# Patient Record
Sex: Male | Born: 1980 | Race: White | Hispanic: No | Marital: Single | Smoking: Current every day smoker
Health system: Southern US, Community
[De-identification: ages and names within clinical notes are randomized; demographics above are authoritative.]

## PROBLEM LIST (undated history)

## (undated) DIAGNOSIS — F329 Major depressive disorder, single episode, unspecified: Secondary | ICD-10-CM

## (undated) DIAGNOSIS — M129 Arthropathy, unspecified: Secondary | ICD-10-CM

## (undated) DIAGNOSIS — F32A Depression, unspecified: Secondary | ICD-10-CM

## (undated) DIAGNOSIS — F419 Anxiety disorder, unspecified: Secondary | ICD-10-CM

## (undated) DIAGNOSIS — Z8614 Personal history of Methicillin resistant Staphylococcus aureus infection: Secondary | ICD-10-CM

## (undated) DIAGNOSIS — K759 Inflammatory liver disease, unspecified: Secondary | ICD-10-CM

## (undated) DIAGNOSIS — L03115 Cellulitis of right lower limb: Secondary | ICD-10-CM

## (undated) DIAGNOSIS — M1990 Unspecified osteoarthritis, unspecified site: Secondary | ICD-10-CM

## (undated) DIAGNOSIS — L0390 Cellulitis, unspecified: Secondary | ICD-10-CM

## (undated) HISTORY — DX: Unspecified osteoarthritis, unspecified site: M19.90

## (undated) HISTORY — DX: Cellulitis of right lower limb: L03.115

## (undated) HISTORY — DX: Depression, unspecified: F32.A

## (undated) HISTORY — DX: Anxiety disorder, unspecified: F41.9

## (undated) HISTORY — DX: Major depressive disorder, single episode, unspecified: F32.9

## (undated) HISTORY — PX: GALLBLADDER SURGERY: SHX652

## (undated) HISTORY — PX: HX GALL BLADDER SURGERY/CHOLE: SHX55

---
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
## 2014-09-12 ENCOUNTER — Ambulatory Visit (INDEPENDENT_AMBULATORY_CARE_PROVIDER_SITE_OTHER): Payer: Self-pay | Admitting: Emergency Medicine

## 2014-09-12 VITALS — BP 100/70 | HR 67 | Temp 98.4°F | Resp 16 | Ht 65.5 in | Wt 209.0 lb

## 2014-09-12 DIAGNOSIS — F1020 Alcohol dependence, uncomplicated: Secondary | ICD-10-CM | POA: Insufficient documentation

## 2014-09-12 DIAGNOSIS — F1910 Other psychoactive substance abuse, uncomplicated: Secondary | ICD-10-CM

## 2014-09-12 NOTE — Progress Notes (Signed)
   Subjective:  Patient ID: Robert [name], male    DOB: 08/06/1980  Age: 34 y.o. MRN: [id number]  CC: Annual Exam   HPI Robert [name] presents  with a history of his fourth DUI. He's been suspended for quite a while he began drinking at the age of 34. He will abused both alcohol and marijuana. He claims sobriety since beginning in [place] and October 1 of 2015. He is currently residing in a [facility]. Continuing is AA involvement.  History  Social, family, and past medical history are reviewed. Except for his history of alcohol abuse multiple DUIs and marijuana use are negative. He's been in remission for 9 months now.  Review of Systems  Objective:  BP 100/70 mmHg  Pulse 67  Temp(Src) 98.4 F (36.9 C) (Oral)  Resp 16  Ht 5' 5.5" (1.664 m)  Wt 209 lb (94.802 kg)  BMI 34.24 kg/m2  SpO2 98%  Physical Exam  Constitutional: He is oriented to person, place, and time. He appears well-developed and well-nourished. No distress.  HENT:  Head: Normocephalic and atraumatic.  Right Ear: External ear normal.  Left Ear: External ear normal.  Nose: Nose normal.  Eyes: Conjunctivae and EOM are normal. Pupils are equal, round, and reactive to light. No scleral icterus.  Neck: Normal range of motion. Neck supple. No tracheal deviation present.  Cardiovascular: Normal rate, regular rhythm and normal heart sounds.   Pulmonary/Chest: Effort normal. No respiratory distress. He has no wheezes. He has no rales.  Abdominal: He exhibits no mass. There is no tenderness. There is no rebound and no guarding.  Musculoskeletal: He exhibits no edema.  Lymphadenopathy:    He has no cervical adenopathy.  Neurological: He is alert and oriented to person, place, and time. Coordination normal.  Skin: Skin is warm and dry. No rash noted.  Psychiatric: He has a normal mood and affect. His behavior is normal.      Assessment & Plan:   Robert [name] was seen today for annual exam.  Diagnoses and all  orders for this visit:  Alcoholism  Substance abuse   I am having Robert [name] maintain his sertraline.  Meds ordered this encounter  Medications  . sertraline (ZOLOFT) 100 MG tablet    Sig: Take 100 mg by mouth daily.    Appropriate red flag conditions were discussed with the patient as well as actions that should be taken.  Patient expressed his understanding.  Follow-up: Return if symptoms worsen or fail to improve.  Carmelina DaneAnderson, Yurianna Tusing S, MD

## 2015-10-13 ENCOUNTER — Emergency Department (HOSPITAL_BASED_OUTPATIENT_CLINIC_OR_DEPARTMENT_OTHER)
Admission: EM | Admit: 2015-10-13 | Discharge: 2015-10-13 | Disposition: A | Discharge status: Other Acute Inpatient Hospital | Payer: Self-pay | Attending: Emergency Medicine | Admitting: Emergency Medicine

## 2015-10-13 ENCOUNTER — Inpatient Hospital Stay
Admission: RE | Admit: 2015-10-13 | Discharge: 2015-10-16 | DRG: 897 | Disposition: A | Discharge status: Home / Self Care | Payer: Self-pay | Source: Intra-hospital | Attending: PSYCHIATRY | Admitting: PSYCHIATRY

## 2015-10-13 ENCOUNTER — Encounter (HOSPITAL_BASED_OUTPATIENT_CLINIC_OR_DEPARTMENT_OTHER): Payer: Self-pay

## 2015-10-13 DIAGNOSIS — F329 Major depressive disorder, single episode, unspecified: Secondary | ICD-10-CM | POA: Diagnosis present

## 2015-10-13 DIAGNOSIS — Z560 Unemployment, unspecified: Secondary | ICD-10-CM

## 2015-10-13 DIAGNOSIS — F1220 Cannabis dependence, uncomplicated: Secondary | ICD-10-CM

## 2015-10-13 DIAGNOSIS — F418 Other specified anxiety disorders: Secondary | ICD-10-CM | POA: Insufficient documentation

## 2015-10-13 DIAGNOSIS — Z590 Homelessness: Secondary | ICD-10-CM

## 2015-10-13 DIAGNOSIS — F17210 Nicotine dependence, cigarettes, uncomplicated: Secondary | ICD-10-CM | POA: Insufficient documentation

## 2015-10-13 DIAGNOSIS — Y903 Blood alcohol level of 60-79 mg/100 ml: Secondary | ICD-10-CM | POA: Insufficient documentation

## 2015-10-13 DIAGNOSIS — F1020 Alcohol dependence, uncomplicated: Secondary | ICD-10-CM | POA: Diagnosis present

## 2015-10-13 DIAGNOSIS — M62838 Other muscle spasm: Secondary | ICD-10-CM

## 2015-10-13 DIAGNOSIS — F419 Anxiety disorder, unspecified: Secondary | ICD-10-CM

## 2015-10-13 DIAGNOSIS — F1110 Opioid abuse, uncomplicated: Secondary | ICD-10-CM | POA: Diagnosis present

## 2015-10-13 DIAGNOSIS — Z8614 Personal history of Methicillin resistant Staphylococcus aureus infection: Secondary | ICD-10-CM | POA: Insufficient documentation

## 2015-10-13 DIAGNOSIS — F1423 Cocaine dependence with withdrawal: Secondary | ICD-10-CM | POA: Diagnosis present

## 2015-10-13 DIAGNOSIS — F1994 Other psychoactive substance use, unspecified with psychoactive substance-induced mood disorder: Secondary | ICD-10-CM

## 2015-10-13 DIAGNOSIS — F1924 Other psychoactive substance dependence with psychoactive substance-induced mood disorder: Secondary | ICD-10-CM | POA: Diagnosis present

## 2015-10-13 DIAGNOSIS — F1010 Alcohol abuse, uncomplicated: Secondary | ICD-10-CM | POA: Insufficient documentation

## 2015-10-13 DIAGNOSIS — F1520 Other stimulant dependence, uncomplicated: Secondary | ICD-10-CM | POA: Diagnosis present

## 2015-10-13 DIAGNOSIS — R45851 Suicidal ideations: Secondary | ICD-10-CM

## 2015-10-13 DIAGNOSIS — F1420 Cocaine dependence, uncomplicated: Secondary | ICD-10-CM | POA: Diagnosis present

## 2015-10-13 DIAGNOSIS — F1493 Cocaine use, unspecified with withdrawal (CMS HCC): Secondary | ICD-10-CM | POA: Diagnosis present

## 2015-10-13 DIAGNOSIS — Z716 Tobacco abuse counseling: Secondary | ICD-10-CM | POA: Insufficient documentation

## 2015-10-13 DIAGNOSIS — R030 Elevated blood-pressure reading, without diagnosis of hypertension: Secondary | ICD-10-CM | POA: Insufficient documentation

## 2015-10-13 DIAGNOSIS — F1910 Other psychoactive substance abuse, uncomplicated: Secondary | ICD-10-CM | POA: Insufficient documentation

## 2015-10-13 DIAGNOSIS — F19239 Other psychoactive substance dependence with withdrawal, unspecified: Secondary | ICD-10-CM | POA: Diagnosis present

## 2015-10-13 DIAGNOSIS — F3289 Other specified depressive episodes: Secondary | ICD-10-CM | POA: Diagnosis present

## 2015-10-13 HISTORY — DX: Personal history of Methicillin resistant Staphylococcus aureus infection: Z86.14

## 2015-10-13 HISTORY — DX: Depression, unspecified: F32.A

## 2015-10-13 HISTORY — DX: Anxiety disorder, unspecified: F41.9

## 2015-10-13 HISTORY — DX: Arthropathy, unspecified: M12.9

## 2015-10-13 LAB — COMPREHENSIVE METABOLIC PROFILE - BMC/JMC ONLY
ALBUMIN/GLOBULIN RATIO: 1.9 (ref 0.8–2.0)
ALBUMIN: 4.7 g/dL (ref 3.5–5.0)
ALKALINE PHOSPHATASE: 78 U/L (ref 38–126)
ALT (SGPT): 33 U/L (ref 17–63)
ANION GAP: 12 mmol/L — ABNORMAL HIGH (ref 3–11)
AST (SGOT): 29 U/L (ref 15–41)
BILIRUBIN TOTAL: 0.4 mg/dL (ref 0.3–1.2)
BUN/CREA RATIO: 7 (ref 6–22)
BUN: 6 mg/dL (ref 6–20)
CALCIUM: 9.6 mg/dL (ref 8.6–10.3)
CHLORIDE: 105 mmol/L (ref 101–111)
CO2 TOTAL: 23 mmol/L (ref 22–32)
CREATININE: 0.84 mg/dL (ref 0.61–1.24)
ESTIMATED GFR: >60 mL/min/1.73mˆ2 (ref >60)
GLUCOSE: 112 mg/dL — ABNORMAL HIGH (ref 70–110)
POTASSIUM: 3.8 mmol/L (ref 3.4–5.1)
PROTEIN TOTAL: 7.2 g/dL (ref 6.4–8.3)
SODIUM: 140 mmol/L (ref 136–145)

## 2015-10-13 LAB — DRUG SCREEN,URINE - BMC/JMC ONLY
AMPHETAMINES URINE: POSITIVE — AB
BARBITURATES URINE: NEGATIVE
BENZODIAZEPINES URINE: NEGATIVE
CANNABINOIDS URINE: POSITIVE — AB
COCAINE METABOLITES URINE: POSITIVE — AB
METHADONE URINE: NEGATIVE
OPIATES URINE: NEGATIVE
OXYCODONE URINE: NEGATIVE
PCP URINE: NEGATIVE
TRICYCLIC ANTIDEPRESSANTS URINE: NEGATIVE

## 2015-10-13 LAB — CBC WITH DIFF
BASOPHIL #: 0.1 x10ˆ3/uL (ref 0.00–0.10)
BASOPHIL %: 1 % (ref 0–3)
EOSINOPHIL #: 0.1 x10ˆ3/uL (ref 0.00–0.50)
EOSINOPHIL %: 1 % (ref 0–5)
HCT: 48.3 % (ref 40.0–50.0)
HGB: 16.4 g/dL (ref 13.5–18.0)
LYMPHOCYTE #: 2.5 x10ˆ3/uL (ref 1.00–4.80)
LYMPHOCYTE %: 26 % (ref 15–43)
MCH: 30.1 pg (ref 27.5–33.2)
MCHC: 34 g/dL (ref 32.0–36.0)
MCV: 88.5 fL (ref 82.0–97.0)
MONOCYTE #: 0.7 x10ˆ3/uL (ref 0.20–0.90)
MONOCYTE %: 7 % (ref 5–12)
MPV: 9.3 fL (ref 7.4–10.5)
MPV: 9.3 fL (ref 7.4–10.5)
NEUTROPHIL #: 6.1 x10ˆ3/uL (ref 1.50–6.50)
NEUTROPHIL %: 65 % (ref 43–76)
PLATELETS: 234 x10ˆ3/uL (ref 150–450)
RBC: 5.45 x10ˆ6/uL (ref 4.40–5.80)
RDW: 13.8 % (ref 11.0–16.0)
WBC: 9.4 x10ˆ3/uL (ref 4.0–11.0)

## 2015-10-13 LAB — ETHANOL, SERUM/PLASMA: ETHANOL: 78 mg/dL — ABNORMAL HIGH (ref <10)

## 2015-10-13 LAB — ACETAMINOPHEN LEVEL: ACETAMINOPHEN LEVEL: <10 ug/mL — ABNORMAL LOW (ref 10–30)

## 2015-10-13 LAB — URINALYSIS WITH MICROSCOPIC REFLEX IF INDICATED BMC/JMC ONLY
BILIRUBIN: NEGATIVE mg/dL
BLOOD: NEGATIVE mg/dL
BLOOD: NEGATIVE mg/dL
GLUCOSE: NEGATIVE mg/dL
KETONES: NEGATIVE mg/dL
LEUKOCYTES: NEGATIVE WBCs/uL
PH: 5 (ref <8.0)
PROTEIN: NEGATIVE mg/dL
SPECIFIC GRAVITY: 1.015 (ref <1.022)

## 2015-10-13 LAB — SALICYLATE ACID LEVEL: SALICYLATE LEVEL: <4 mg/dL — ABNORMAL LOW (ref 4–30)

## 2015-10-13 LAB — THYROID STIMULATING HORMONE WITH FREE T4 REFLEX: TSH: 1.509 u[IU]/mL (ref 0.340–5.330)

## 2015-10-13 MED ORDER — MAGNESIUM HYDROXIDE 400 MG/5 ML ORAL SUSPENSION
30.0000 mL | Freq: Every evening | ORAL | Status: DC | PRN
Start: 2015-10-13 — End: 2015-10-16

## 2015-10-13 MED ORDER — NICOTINE (POLACRILEX) 2 MG GUM
2.0000 mg | CHEWING_GUM | BUCCAL | Status: DC | PRN
Start: 2015-10-13 — End: 2015-10-16
  Administered 2015-10-13 – 2015-10-16 (×12): 2 mg via ORAL
  Filled 2015-10-13 (×13): qty 1

## 2015-10-13 MED ORDER — LOPERAMIDE 2 MG CAPSULE
2.0000 mg | ORAL_CAPSULE | ORAL | Status: DC | PRN
Start: 2015-10-13 — End: 2015-10-16

## 2015-10-13 MED ORDER — DICYCLOMINE 10 MG CAPSULE
20.0000 mg | ORAL_CAPSULE | ORAL | Status: DC | PRN
Start: 2015-10-13 — End: 2015-10-16
  Administered 2015-10-16: 20 mg via ORAL
  Filled 2015-10-13: qty 2

## 2015-10-13 MED ORDER — DIPHENHYDRAMINE 50 MG/ML INJECTION SOLUTION
50.0000 mg | Freq: Four times a day (QID) | INTRAMUSCULAR | Status: DC | PRN
Start: 2015-10-13 — End: 2015-10-16

## 2015-10-13 MED ORDER — MIRTAZAPINE 15 MG TABLET
7.5000 mg | ORAL_TABLET | Freq: Every evening | ORAL | Status: DC | PRN
Start: 2015-10-13 — End: 2015-10-16
  Administered 2015-10-14 – 2015-10-15 (×2): 7.5 mg via ORAL
  Filled 2015-10-13 (×2): qty 1

## 2015-10-13 MED ORDER — CLONIDINE HCL 0.1 MG TABLET
0.1000 mg | ORAL_TABLET | ORAL | Status: DC | PRN
Start: 2015-10-13 — End: 2015-10-16
  Administered 2015-10-14 – 2015-10-16 (×2): 0.1 mg via ORAL
  Filled 2015-10-13 (×3): qty 1

## 2015-10-13 MED ORDER — GABAPENTIN 300 MG CAPSULE
300.0000 mg | ORAL_CAPSULE | Freq: Three times a day (TID) | ORAL | Status: DC
Start: 2015-10-13 — End: 2015-10-16
  Administered 2015-10-13 – 2015-10-16 (×10): 300 mg via ORAL
  Filled 2015-10-13 (×11): qty 1

## 2015-10-13 MED ORDER — DIPHENHYDRAMINE 50 MG CAPSULE
50.0000 mg | ORAL_CAPSULE | Freq: Four times a day (QID) | ORAL | Status: DC | PRN
Start: 2015-10-13 — End: 2015-10-16

## 2015-10-13 MED ORDER — HALOPERIDOL 5 MG TABLET
5.0000 mg | ORAL_TABLET | Freq: Four times a day (QID) | ORAL | Status: DC | PRN
Start: 2015-10-13 — End: 2015-10-16

## 2015-10-13 MED ORDER — LORAZEPAM 2 MG/ML INJECTION SOLUTION
2.0000 mg | Freq: Four times a day (QID) | INTRAMUSCULAR | Status: DC | PRN
Start: 2015-10-13 — End: 2015-10-14

## 2015-10-13 MED ORDER — HALOPERIDOL LACTATE 5 MG/ML INJECTION SOLUTION
5.0000 mg | Freq: Four times a day (QID) | INTRAMUSCULAR | Status: DC | PRN
Start: 2015-10-13 — End: 2015-10-16

## 2015-10-13 MED ORDER — ACETAMINOPHEN 325 MG TABLET
650.0000 mg | ORAL_TABLET | ORAL | Status: DC | PRN
Start: 2015-10-13 — End: 2015-10-16
  Administered 2015-10-15 – 2015-10-16 (×4): 650 mg via ORAL
  Filled 2015-10-13 (×4): qty 2

## 2015-10-13 MED ORDER — MELOXICAM 7.5 MG TABLET
7.5000 mg | ORAL_TABLET | Freq: Every day | ORAL | Status: DC
Start: 2015-10-13 — End: 2015-10-15
  Administered 2015-10-13 – 2015-10-15 (×3): 7.5 mg via ORAL
  Filled 2015-10-13 (×4): qty 1

## 2015-10-13 MED ORDER — ONDANSETRON 4 MG DISINTEGRATING TABLET
4.0000 mg | ORAL_TABLET | Freq: Four times a day (QID) | ORAL | Status: DC | PRN
Start: 2015-10-13 — End: 2015-10-16

## 2015-10-13 MED ORDER — ALUMINUM-MAG HYDROXIDE-SIMETHICONE 200 MG-200 MG-20 MG/5 ML ORAL SUSP
30.0000 mL | ORAL | Status: DC | PRN
Start: 2015-10-13 — End: 2015-10-16

## 2015-10-13 MED ORDER — LORAZEPAM 2 MG TABLET
2.0000 mg | ORAL_TABLET | Freq: Four times a day (QID) | ORAL | Status: DC | PRN
Start: 2015-10-13 — End: 2015-10-14
  Administered 2015-10-13 – 2015-10-14 (×2): 2 mg via ORAL
  Filled 2015-10-13 (×2): qty 1

## 2015-10-13 NOTE — ED Provider Notes (Signed)
[facility]  [facility]  [address]  [place], [place] [address]    Emergency Department Visit Note      Date: 10/13/2015  Primary care provider: No Established Pcp  Means of arrival: ambulance  History obtained by: patient  History limited by: none    Chief Complaint: desire for detox, suicidal thoughts    History of Present Illness     [name], date of birth 1981-02-22, is a 35 y.o. male who presents to the Emergency Department complaining of "I'm tired of relapsing, I keep screwing up." Reports a long history (17 years) of alcohol abuse, last drink was one hour ago. Since last Saturday, patient also reports a lot of drug use. "Last night I shot up heroin and was kind of hoping it would kill me." Also admits to smoking crack, using Adderall, pot, and "possibly more, I spent about 3 thousand dollars on drugs. I just don't care anymore." Has been to rehab many times before. Was recently fired from his job. Reports that if he is allowed to go home for outpatient treatment, he'll keep using like he has been.   Reports that he has experienced withdrawal symptoms before and feels similarly now, reports that he is starting to develop a headache and feels like his skin is crawling.   Denies homicidal ideations, visual or auditory hallucinations ("except for when I'm high").     Review of Systems     The pertinent positive and negative symptoms are as per HPI. All other systems reviewed and are negative.    Patient History      Past Medical History:  Past Medical History:   Diagnosis Date    Anxiety     Arthropathy     Depression     Hx MRSA infection        Past Surgical History:  Past Surgical History:   Procedure Laterality Date    HX CHOLECYSTECTOMY             Family History:  No family history on file.        Social History:  Social History   Substance Use Topics    Smoking status: Current Every Day Smoker     Packs/day: 1.00     Types: Cigarettes    Smokeless tobacco: None     Alcohol use Yes      Comment: "Couple times a week"  "Enough to get drunk"     History   Drug Use    Yes    Special: Heroin, Cocaine, Marijuana     Comment: "Adderal, Crack"       Medications:  There are no discharge medications for this patient.      Allergies:   No Known Allergies    Physical Exam     Vital Signs:    Filed Vitals:    10/13/15 0638 10/13/15 1224   BP: 127/79 (!) 143/86   Pulse: 88 95   Resp: 18 18   Temp: 37 C (98.6 F)    SpO2: 99% 99%       Pulse Ox: 99% on None (Room Air); interpreted by me UJ:[id number]    Constitutional: This is an awake, WDWN male patient who appears in no acute distress.     Neuro/Psychiatric: Patient is interactive and appropriate. Good eye contact. Affect is somewhat flat.    Neurological: Normal facial symmetry and speech. Oriented to person, place, time and event.  Skin: Warm, dry and normal  color. No rash noted.  HEENT:   Head: Normocephalic and atraumatic.   Eyes: Conjunctiva normal in appearance.   Neck: Trachea midline. Neck supple. No adenopathy.   Cardiovascular: RRR, no obvious murmurs, rubs or gallops appreciated. Intact distal pulses.    Pulmonary/Chest: No respiratory distress. BS clear and equal to auscultation bilaterally without wheezes, rales or rhonchi.   Abdominal: BS +. Abdomen soft, no tenderness, rebound or guarding. No obvious masses or organomegaly.    Genitourinary: Negative CVAT  Musculoskeletal: Moves spine and all extremities well.     Diagnostics     Labs:    Results for orders placed or performed during the hospital encounter of 10/13/15   COMPREHENSIVE METABOLIC PROFILE - [facility] ONLY   Result Value Ref Range    SODIUM 140 136 - 145 mmol/L    POTASSIUM 3.8 3.4 - 5.1 mmol/L    CHLORIDE 105 101 - 111 mmol/L    CO2 TOTAL 23 22 - 32 mmol/L    ANION GAP 12 (H) 3 - 11 mmol/L    BUN 6 6 - 20 mg/dL    CREATININE 1.61 0.96 - 1.24 mg/dL    BUN/CREA RATIO 7 6 - 22    ESTIMATED GFR >60 >60 mL/min/1.64m2    ALBUMIN 4.7 3.5 - 5.0 g/dL    CALCIUM 9.6 8.6 -  04.5 mg/dL    GLUCOSE 409 (H) 70 - 110 mg/dL    ALKALINE PHOSPHATASE 78 38 - 126 U/L    ALT (SGPT) 33 17 - 63 U/L    AST (SGOT) 29 15 - 41 U/L    BILIRUBIN TOTAL 0.4 0.3 - 1.2 mg/dL    PROTEIN TOTAL 7.2 6.4 - 8.3 g/dL    ALBUMIN/GLOBULIN RATIO 1.9 0.8 - 2.0   DRUG SCREEN,URINE,RAPID - [facility] ONLY   Result Value Ref Range    AMPHETAMINES URINE Positive (A) Negative    BARBITURATES URINE Negative Negative    BENZODIAZEPINES URINE Negative Negative    PCP URINE Negative Negative    METHADONE URINE Negative Negative    OPIATES URINE Negative Negative    CANNABINOIDS URINE Positive (A) Negative    COCAINE METABOLITES URINE Positive (A) Negative    OXYCODONE URINE Negative Negative    TRICYCLIC ANTIDEPRESSANTS URINE Negative Negative   ETHANOL, SERUM   Result Value Ref Range    ETHANOL 78 (H) <10 mg/dL   THYROID STIMULATING HORMONE WITH FREE T4 REFLEX   Result Value Ref Range    TSH 1.509 0.340 - 5.330 uIU/mL   URINALYSIS WITH MICROSCOPIC REFLEX IF INDICATED [facility] ONLY   Result Value Ref Range    COLOR Yellow Light Yellow, Straw, Yellow    APPEARANCE Clear Clear    PH 5.0 <8.0    LEUKOCYTES Negative Negative WBCs/uL    NITRITE Negative Negative    PROTEIN Negative Negative mg/dL    GLUCOSE Negative Negative mg/dL    KETONES Negative Negative mg/dL    UROBILINOGEN < 2.0 <=8.1 mg/dL    BILIRUBIN Negative Negative mg/dL    BLOOD Negative Negative mg/dL    SPECIFIC GRAVITY 1.914 <1.022   CBC WITH DIFF   Result Value Ref Range    WBC 9.4 4.0 - 11.0 x103/uL    RBC 5.45 4.40 - 5.80 x106/uL    HGB 16.4 13.5 - 18.0 g/dL    HCT 78.2 95.6 - 21.3 %    MCV 88.5 82.0 - 97.0 fL    MCH 30.1 27.5 - 33.2 pg  MCHC 34.0 32.0 - 36.0 g/dL    RDW 16.113.8 09.611.0 - 04.516.0 %    PLATELETS 234 150 - 450 x103/uL    MPV 9.3 7.4 - 10.5 fL    NEUTROPHIL % 65 43 - 76 %    LYMPHOCYTE % 26 15 - 43 %    MONOCYTE % 7 5 - 12 %    EOSINOPHIL % 1 0 - 5 %    BASOPHIL % 1 0 - 3 %    NEUTROPHIL # 6.10 1.50 - 6.50 x103/uL    LYMPHOCYTE # 2.50 1.00 - 4.80 x103/uL     MONOCYTE # 0.70 0.20 - 0.90 x103/uL    EOSINOPHIL # 0.10 0.00 - 0.50 x103/uL    BASOPHIL # 0.10 0.00 - 0.10 x103/uL   Salicylate Acid Level   Result Value Ref Range    SALICYLATE LEVEL <4 (L) 4 - 30 mg/dL   Acetaminophen Level   Result Value Ref Range    ACETAMINOPHEN LEVEL <10 (L) 10 - 30 ug/mL     Labs reviewed and interpreted by me.      EKG interpretation:  12-Lead EKG reveals, per Dr. Nedra HaiLee-  sinus rhythm, rate of 86, normal axis, regular intervals, no ST segment elevation noted.    ED Progress Note/Medical Decision Making   Old records reviewed by me:   ED Visits - no prior visits on record   I have reviewed the patient's recent past medical history. Nurse's notes reviewed.  Orders placed during this encounter:  Orders Placed This Encounter    CBC/DIFF    COMPREHENSIVE METABOLIC PROFILE - [facility] ONLY    DRUG SCREEN,URINE,RAPID - [facility] ONLY    ETHANOL, SERUM    THYROID STIMULATING HORMONE WITH FREE T4 REFLEX    URINALYSIS WITH MICROSCOPIC REFLEX IF INDICATED [facility] ONLY    CBC WITH DIFF    Salicylate Acid Level    Acetaminophen Level    ECG 12-Lead     Pt was inially evaluated by me, possible etiologies for symptoms were discussed and the need for further evaluation with the above labs and consult with crisis, pt verbalized understanding and was in agreement with the plan at this time. He is asking for a breakfast tray. Have relayed this to RN.     956 AM - Patient has eaten breakfast and is sleeping comfortably.     Have been back to check on the patient multiple times, he continues to sleep.     1043 - Have asked RN to call crisis. Crisis worker Harvin HazelKelli is reporting that she has already been in to see the patient and is currently waiting on a recommendation from Dr. Deno Lungeryu.     1115 - [facility] Den[facility].     Supervision physician: Dr. Nedra HaiLee     Pre-hypertension/Hypertension:  The patient has been informed that they may have pre-hypertension or Hypertension based on elevated blood  pressure reading in the ED.  The admitting physician will follow up on this while the patient is in the hospital.    I have screened the patient for tobacco use in the patient is a tobacco user.  I have counseled the patient for less than 3 minutes to quit using tobacco products due to the multiple adverse health effects.      Filed Vitals:    10/13/15 0638 10/13/15 1224   BP: 127/79 (!) 143/86   Pulse: 88 95   Resp: 18 18  Temp: 37 C (98.6 F)    SpO2: 99% 99%       Clinical Impression      Encounter Diagnoses   Name Primary?    Suicidal thoughts Yes    Polysubstance abuse        Plan/Disposition     Admitted      Condition on Disposition: Stable

## 2015-10-13 NOTE — Behavioral Health (Signed)
Additional questions asked to pt:    Alcohol use- 4 times a week, about 12 beers  Pt states he cannot go back to his apartment, but he is understanding of the fact that he will not have housing provided to him following [facility].    [facility] Dr Deno Lungeryu

## 2015-10-13 NOTE — Behavioral Health (Signed)
Type of group:  Process  Group technique:  Open discussion using CBT and person centered strategies  Time group initiated:  1440  Length of group time:  50 Minutes  Patient's mental status/affect:  N/A  Patient's behavior:  Pt was invited to group, but did not attend.  Patient's response:  N/A

## 2015-10-13 NOTE — H&P (Signed)
[facility]  Initial Inpatient Psychiatric Evaluation     [name] SinclairJoseph Ohalloran  [id number]  03/11/1980  Date of Service: 10/13/2015      Chief Complaint:  Suicidal ideations    ID:  [name] is a 35 y[place]o. [place] [id number]    HPI: [name] is a 35 y.o. male with a long h/o cocaine dependence who presented to the ED reporting suicidal thoughts in the context of difficulty obtaining sobriety, losing his job, becoming homeless. He reportedly spent more than $3000 on drugs in the past month. He does not have a plan to harm self but he has been feeling , depressed, hopeless and using heroin recklessly. His main drug use involve crack cocaine. Also uses marijuana, adderall, alcohol. No psychosis or mania elicited.      PPHx:   1. Diagnosis/onset of illness:   - drug related  2. Current and past outpatient psych treatment:    - none  3. Inpatient hospitalizations:    - none   4. Medication trials in the past:    - wellbutrin  5. Suicide attempts:    - denies  6. Substance use:   - drug use since age 35, mostly cocaine   - alcohol use heavily in the past, now bout 3-4 times per week - had some period of reduction, states now building back   - stimulants, cannabis intermittently   - has a h/o multiple stints in rehab and detox    PMHx/PSHx: denies    Current Home Medications:   Prior to Admission medications    Not on File         Current Facility-Administered Medications:  acetaminophen (TYLENOL) tablet 650 mg Oral Q4H PRN   aluminum-magnesium hydroxide-simethicone (MAG-AL PLUS) 200-200-20 mg per 5 mL oral liquid 30 mL Oral Q4H PRN   cloNIDine (CATAPRES) tablet 0.1 mg Oral Q4H PRN   dicyclomine (BENTYL) capsule 20 mg Oral Q4H PRN   diphenhydrAMINE (BENADRYL) capsule 50 mg Oral Q6H PRN   Or      diphenhydrAMINE (BENADRYL) 50 mg/mL injection 50 mg Intramuscular Q6H PRN   gabapentin (NEURONTIN) capsule 300 mg Oral 3x/day   haloperidol (HALDOL) tablet 5 mg Oral Q6H PRN   Or      haloperidol  (HALDOL) 5 mg/mL injection 5 mg Intramuscular Q6H PRN   loperamide (IMODIUM) capsule 2 mg Oral Q1H PRN   LORazepam (ATIVAN) tablet 2 mg Oral Q6H PRN   Or      LORazepam (ATIVAN) 2 mg/mL injection 2 mg Intramuscular Q6H PRN   magnesium hydroxide (MILK OF MAGNESIA) 400mg  per 5mL oral liquid 30 mL Oral HS PRN   meloxicam (MOBIC) tablet 7.5 mg Oral Daily   mirtazapine (REMERON) tablet 7.5 mg Oral HS PRN - MR x 1   nicotine polacrilex (NICORETTE) chewing gum 2 mg Oral Q1H PRN   ondansetron (ZOFRAN ODT) rapid dissolve tablet 4 mg Oral Q6H PRN         Allergies: reviewed   Allergies as of 10/13/2015    (No Known Allergies)       Social History:  -Support system: Lives alone, homeless - recently lost job and employer provided housing; He has no children and no prior marriages.  -Education: HS grad  -Employment: None; was working putting in [employer]; recently unemployed    -Trauma/Abuse: denies    Family history: reviewed  -Medical:  No family history on file.    -Psychiatric: alcohol use d/o in  multiple family members; mother also addicted to opiates    ROS:   Constitutional:   (-) Fever/chills  Eyes:     (-) New vision changes  ENT:      (-) Rhinorrhea   Cardiovascular:        (-) Chest pain    Respiratory:       (-) Shortness of breath   GI:         (-) Nausea/Vomiting   GU:          (-) Dysuria   Skin:           (-) New lesions    Musculoskeletal:          (-) Muscle stiffness/pains     Physical Exam:  Filed Vitals:    10/13/15 1302 10/13/15 1800   BP: (!) 148/77 121/69   Pulse: 90 88   Resp: 18 16   Temp: 36.9 C (98.4 F) 36.8 C (98.2 F)   SpO2: 98% 97%     Mental Status Exam [Per above +]  [name] appears to have fair grooming, adequate hygiene. Poorly engaged, somewhat somnolent, keeping eyes closed, lying in bed. No abnormal involuntary movements noted. Speech wnl. Mood depressed; affect is limited by lack of alertness. Thought process is linear. No delusions elicited. [name] currently reports passive  death wishes; he appears to be without auditory or visual hallucinations.  Insight and judgement limited. Well oriented.    Lab: CBC wnl; CMP wnl; TSH wnl; UA unremakrable; Utox +ve for cannabis, amphetamines, cocaine      ASSESSMENT    I - substance induced depressive d/o; cocaine use d/o, severe dependence; alcohol use d/o, moderate; stimulant use d/o, mild; cannabis use d/o, mild  II - deferred  III - none  IV - homeless, unemployed, single    Plan:  1. Disposition: Patient will be admitted to inpatient psychiatry for acute stabilization of suicidal thoughts while monitoring detox. Will likely be discharged in 3-4 day or when there is an absence of suicidal thoughts x48hours.   2. Legal status: voluntary. [facility].   3. Meds - start wellbutrin 75mg  po bid to mitigate cocaine withdrawal symptoms  4. Psychoeducation - Risks and benefits of medications discussed and patient verbalized understanding.   5. Medical comorbidities - none  6. Labs/work-up - none  7. Social issues - patient has own transportation home  8. Precautions - none  9. Therapy - encouraged to attend all groups; provide milieu therapy and brief individual therapy as appropriate.       Marcy PanningHelen Breunna Nordmann M.D.  10/13/2015; Mertie Moores20:10

## 2015-10-13 NOTE — ED Nurses Note (Signed)
Patient eating breakfast tray

## 2015-10-13 NOTE — Nurses Notes (Signed)
Prn   Nicorette gum given at patients request for cravings to smoke - Will monitor for effectiveness

## 2015-10-13 NOTE — ED Nurses Note (Signed)
Patient to [facility] with Crisis Worker Harvin HazelKelli.

## 2015-10-13 NOTE — Consults (Signed)
Consult completed. Please see full behavioral health note.

## 2015-10-13 NOTE — Behavioral Health (Signed)
Current Complaint: Polysubstance abuse and depression with SI    The [place] is from [place] and [place]. He states that he came here because he is "sick of this." He states that he worked hard to get a job and lost it due to his drug use. He says that he keeps doing the same thing "over and over." He endorses thoughts of hopelessness and feels like a failure. He states that he has also spent more than $3,000 on drugs within the last month.     He expresses that he is unable to stop using drugs on his own and a lot of times he uses heroin recklessly without caring if it kills him or not. He also reports cocaine, marijuana, adderall and alcohol abuse in addition to the heroin.     His last use was this morning. He reports currently experiencing sweats, skin crawling and a headache when asked about withdrawal symptoms.    The pt reports passive SI without a plan. He states that he doesn't care about life anymore and would most likely overdose on drugs due to his passive wish to die. He denies any HI or psychosis. He denies any past suicide attempts.     Behavioral: flat    Marital Status: single    Children: none     Living Arrangements: Pt was living in an apartment but states he left a few days ago and has been "high ever since."    Abuse History/ Current Abuse: denies    Trauma History: denies    Psychiatric History: No psych hx, but has been in both inpatient and outpatient treatment for the last 20 years. He denies any current outpatient treatment and is not on any medications.     Disposition: The pt is seeking help for his substance abuse and suicidal ideation. Will consult the psychiatrist once all of pt's labs have resulted.

## 2015-10-13 NOTE — ED Nurses Note (Signed)
Report given to receiving nurse in [facility].

## 2015-10-13 NOTE — Nurses Notes (Signed)
PRN   Ativan given at patients request for increased anxiety - will monitor for effectiveness -

## 2015-10-13 NOTE — ED Nurses Note (Signed)
Patient continues to refuse to provide urine by stating "I just don't have to pee". PO fluids were provided with meal tray earlier.

## 2015-10-13 NOTE — Behavioral Health (Signed)
Pt on pre-admit list. Awaiting orders at this time.    Pt and pt's nurse aware.

## 2015-10-13 NOTE — ED Nurses Note (Signed)
Labs drawn and sent to lab , patient unable to void at this time

## 2015-10-13 NOTE — Nurses Notes (Signed)
Patient admitted voluntarily to unit at 1230 to room 316 A from ER. Safety search and skin assessment done by this RN and EPS worker, Harvin HazelKelli. No contraband found. All skin intact. NKAs.     Patient reports that he did heroin last night and hoped that it would end his life but it did not. UDS negative for opiates; positive for amphetamines, cocaine, and cannabis. He denies suicidal or homicidal ideations or visual or auditory hallucinations. He reports drinking a few times a week and that he drinks beer when he does drink. Last alcoholic drink was today at 7am. ETOH was 78 in ER this am.    He reports that he has had past psychiatric hospitalizations and substance abuse treatment/ rehab programs. He reports that he has been diagnosed with anxiety, major depression and a long history of substance abuse with drug of choice cocaine.     [place] and came here for work. He states that he lost his job due to drug use in [place], and he recently lost a job here as well. He also reports that he spent $3,000 on drugs this past week.   reports that he is homeless but has spent past several days with his wife and daughter.     He is single and [place].    Patient reports smoking a pack of cigarettes per day.     Pleasant and cooperative. Describes mood as depressed but joking during admission assessment. Good eye contact and smiling occasionally.     Orientation packet given to patient and reviewed information with him. Oriented to room, staff, unit. Encouraged him to seek out staff with any questions or need of support.     Patient opted to not be present during belongings search.

## 2015-10-13 NOTE — ED Nurses Note (Signed)
Patient called for triage, not present in waiting area.

## 2015-10-13 NOTE — ED Nurses Note (Signed)
Call placed to crisis.

## 2015-10-13 NOTE — ED Triage Notes (Signed)
Patient presents via EMS stating "I'm tired of relapsing on alcohol and drugs."  When questioned as to a suicide plan patient states "I tried.  I don't have enough drugs I guess."  Patient elicits suicidal ideations, has a flat affect and avoids any eye contact with this RN.  Patient states he is from [place], was here for work, however lost his job.

## 2015-10-14 LAB — LIPID PANEL
CHOL/HDL RATIO: 3.5
CHOLESTEROL: 145 mg/dL (ref 120–199)
HDL CHOL: 41 mg/dL (ref 35–55)
LDL CALC: 71 mg/dL (ref <=130)
TRIGLYCERIDES: 165 mg/dL — ABNORMAL HIGH (ref 40–160)
VLDL CALC: 33 mg/dL (ref 5–35)

## 2015-10-14 MED ORDER — NICOTINE 21 MG/24 HR DAILY TRANSDERMAL PATCH
21.0000 mg | MEDICATED_PATCH | Freq: Every day | TRANSDERMAL | Status: DC
Start: 2015-10-14 — End: 2015-10-16
  Administered 2015-10-14 – 2015-10-16 (×7): 21 mg via TRANSDERMAL
  Filled 2015-10-14 (×5): qty 1

## 2015-10-14 MED ORDER — VENLAFAXINE ER 37.5 MG CAPSULE,EXTENDED RELEASE 24 HR
37.50 mg | ORAL_CAPSULE | ORAL | Status: DC
Start: 2015-10-15 — End: 2015-10-15
  Administered 2015-10-15: 37.5 mg via ORAL
  Filled 2015-10-14 (×3): qty 1

## 2015-10-14 MED ORDER — BUPROPION HCL 75 MG TABLET
75.00 mg | ORAL_TABLET | Freq: Two times a day (BID) | ORAL | Status: DC
Start: 2015-10-14 — End: 2015-10-14
  Administered 2015-10-14: 0 mg via ORAL
  Filled 2015-10-14 (×5): qty 1

## 2015-10-14 MED ORDER — LORAZEPAM 1 MG TABLET
1.00 mg | ORAL_TABLET | ORAL | Status: DC | PRN
Start: 2015-10-14 — End: 2015-10-16
  Administered 2015-10-14: 1 mg via ORAL
  Filled 2015-10-14: qty 1

## 2015-10-14 MED ADMIN — LORazepam 2 mg/mL injection solution: @ 08:00:00

## 2015-10-14 NOTE — Nurses Notes (Addendum)
Patient has been up on unit for meals, meds and group activity. Interaction with peers appropriate. Became inappropriate with this Clinical research associatewriter when told his shoes couldn't be washed in the washing machine. Stated that staff wasn't "bright" and became agitated. Situation de-escalated by allowing patient to wash shoes in his room sink. Patient later apologized stating he is "anxious and just wanted his shoes washed." Patient has been anxious throughout day. Answered "not sure" when asked if he is suicidal or homicidal. Denies all hallucinations. Stated mood as "depressed, irritable and awful." Affect is blunted. Eye contact is good.     Patient shares that he has been an addict since age 35 and is tired of failing at getting clean. States that "all doctors want to do is give you Wellbutrin and it doesn't work" for him. Informs this Clinical research associatewriter that he was clean a little longer than a year and relapsed 5 months ago. States he has lost 4 jobs in 4 months dues to drug addiction and is tired of struggling with addiction and docs doing the same thing with no results. 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 States he is "afraid that he'll be discharged without having set up a job to go to." Instructed patient to speak with Child psychotherapistsocial worker, explain situation and request that he be able to contact potential employers prior to discharge.    Last CIWA 11. Medicated with PRN Ativan.     Will continue to monitor for safety and needs.

## 2015-10-14 NOTE — Nurses Notes (Signed)
10/14/15 0700   SBAR Handoff Report   Handoff report given to: Camille RN and Jessica RN   Time SBAR given: 0700

## 2015-10-14 NOTE — Progress Notes (Signed)
[facility]    Behavioral Health Progress Note      [name] SinclairJoseph [name], 35 y.o.  Date of Birth:  03/19/1980  MRN:  [id number]  CSN:  [id number]    Subjective: He reports he is depressed, 9/10, 10 is the worst. No current suicidal thoughts or homicidal  reported. No hallucinations reported. He reports irritability, headache, crawling on skin etc from alcohol withdrawal. He reports he is worried about his financial situation, being without license and unable to do job. He reports Wellbutrin is not helping and wants to try something different. He is pre-occopupied on getting specific drugs "Xanax. Ativan, Suboxone or Methadone". When he was informed that he does not not qualify for Suboxone traetment as outpatient because he is not using opioids, stated he "will start it from streets and then will qualify".   Objective:     Pt condition today:  same  EXAM  Temperature: 36.7 C (98.1 F)  BP (Non-Invasive): (!) 141/81  Heart Rate: 88  Respiratory Rate: 18  SpO2-1: 99 %   Appearance:  casually dressed   Behavior:  cooperative and irritable, demanding   Motor/MS:  normal gait   Speech:  normal   Mood:  sad   Affect:  irritable   Perception:  normal   Thought:  goal directed/coherent   Thought Content:  appropriate    Suicidal Ideation:  none.     Homicidal Ideation:  none   Level of Consciousness:  alert   Orientation:  oriented to person, oriented to place and oriented to time   Concentration:  fair   Memory:  grossly normal   Conceptual:  abstract thinking   Judgement:  poor   Insight:  poor    Lab Results reviewed:   Lab Results for Last 24 Hours:    Results for orders placed or performed during the hospital encounter of 10/13/15 (from the past 24 hour(s))   LIPID PANEL - FASTING   Result Value Ref Range    CHOLESTEROL 145 120 - 199 mg/dL    HDL CHOL 41 35 - 55 mg/dL    TRIGLYCERIDES 147165 (H) 40 - 160 mg/dL    LDL CALC 71 <=829<=130 mg/dL    VLDL CALC 33 5 - 35 mg/dL    CHOL/HDL RATIO 3.5          Current  Facility-Administered Medications:  acetaminophen (TYLENOL) tablet 650 mg Oral Q4H PRN   aluminum-magnesium hydroxide-simethicone (MAG-AL PLUS) 200-200-20 mg per 5 mL oral liquid 30 mL Oral Q4H PRN   buPROPion (WELLBUTRIN) tablet 75 mg Oral 2x/day   cloNIDine (CATAPRES) tablet 0.1 mg Oral Q4H PRN   dicyclomine (BENTYL) capsule 20 mg Oral Q4H PRN   diphenhydrAMINE (BENADRYL) capsule 50 mg Oral Q6H PRN   Or      diphenhydrAMINE (BENADRYL) 50 mg/mL injection 50 mg Intramuscular Q6H PRN   gabapentin (NEURONTIN) capsule 300 mg Oral 3x/day   haloperidol (HALDOL) tablet 5 mg Oral Q6H PRN   Or      haloperidol (HALDOL) 5 mg/mL injection 5 mg Intramuscular Q6H PRN   loperamide (IMODIUM) capsule 2 mg Oral Q1H PRN   LORazepam (ATIVAN) tablet 2 mg Oral Q6H PRN   Or      LORazepam (ATIVAN) 2 mg/mL injection 2 mg Intramuscular Q6H PRN   magnesium hydroxide (MILK OF MAGNESIA) 400mg  per 5mL oral liquid 30 mL Oral HS PRN   meloxicam (MOBIC) tablet 7.5 mg Oral Daily   mirtazapine (REMERON) tablet 7.5 mg Oral HS  PRN - MR x 1   nicotine (NICODERM CQ) transdermal patch (mg/24 hr) 21 mg Transdermal Daily   nicotine polacrilex (NICORETTE) chewing gum 2 mg Oral Q1H PRN   ondansetron (ZOFRAN ODT) rapid dissolve tablet 4 mg Oral Q6H PRN       ASSESSMENT    I - substance induced depressive d/o; cocaine use d/o, severe dependence; alcohol use d/o, moderate; stimulant use d/o, mild; cannabis use d/o, mild  II - deferred  III - none  IV - homeless, unemployed, single    Plan:  1. Disposition: Patient will be admitted to inpatient psychiatry for acute stabilization of suicidal thoughts while monitoring detox. Will likely be discharged in 3-4 day or when there is an absence of suicidal thoughts x48hours.   2. [facility] status: voluntary. [facility]   3. Meds - Discontinue Wellbutrin as he does not want to try it. Start Effexor XR 37.5 mg qd for depression. Will start CIWA with Ativan PRN coverage.  4. Psychoeducation - Risks and benefits of  medications discussed and patient verbalized understanding.   5. Medical comorbidities - none  6. Labs/work-up - none  7. Social issues - patient has own transportation home  8. Precautions -routine  9. Therapy - encouraged to attend all groups; provide milieu therapy and brief individual therapy as appropriate.

## 2015-10-14 NOTE — Behavioral Health (Signed)
Type of group: Psychoeducational self-esteem  Group technique: hand out and discussion  Time group initiated:  1045  Length of group time: min  Patient's mental status/affect: flat  Patient's behavior:  Patient came to group late.  Patient participated in group. Patient did not provide any verbal feedback.  .  Patient's response:  Patient seemed to be engaged in the group process by listening to the group.

## 2015-10-14 NOTE — Behavioral Health (Signed)
Type of group: process  Group technique: discussion  Time group initiated: 1430  Length of group time: 30 min  Patient's mental status/affect:  Frustrated, tense  Patient's behavior:  Patient participated in group. Patient provided verbal feedback with prompts.    Patient's response:  Patient was engaged in the group process.

## 2015-10-14 NOTE — Care Plan (Signed)
Problem: [facility] Plan of Care (ADULT)  Goal: Plan of Care Review    10/14/15 1314   Coping/Psychosocial   Plan Of Care Reviewed With patient   Coping/Psychosocial   Patient Agreement with Plan of Care agrees   Met with patient to complete biopsychosocial and to complete treatment plan.  Goal: Interdisciplinary Rounds/Family Conf    10/14/15 1314   Interdisciplinary Rounds/Family Conf   Participants clinical therapist;patient   Met with patient to complete biopsychosocial assessment and complete treatment plan.    Goal: Individualization & Mutuality    10/14/15 1314   Personal Strengths/Vulnerabilities   Patient Personal Strengths expressive of needs;intellectual cognitive skills;resourceful;self-awareness   Patient Vulnerabilities Frequent relapse;Chronic mental illness that interferes with level of adaptive function         Problem: Overarching Goals (Adult)  Goal: Adheres to Safety Considerations for Self and Others  Initiated the personal safety plan to reduce suicide risk.  Will follow up prior to discharge.  Goal: Develops/Participates in Therapeutic Alliance to Support Successful Transition  Intervention: [facility]  Met with patient to complete the biopsychosocial.  Mutually discussed and agreed on the treatment goals.     Audit C completed by nursing.  Intervention: Mutually Develop Transition Plan  Patient is not from the area. Patient was living in an apartment provided by his employer. Patient is currently homeless until he can make his way back to [place]. Patient does not have a driver's license.  Discussed with patient follow up psychiatry after discharge.  Follow up care will be discussed before discharge.          Problem: Suicidal Behavior (Adult)  Goal: Suicidal Behavior is Absent/Minimized/Managed    10/14/15 1314   Suicidal Behavior is Absent/Minimized/Managed   Suicidal Behavior Managed/Minimized Time Frame for Action Step (STG) other (see comments)  (5 days)   Patient  endorses thoughts of hopelessness and feels like a failure.  Patient reports passive SI with out a plan.     Goal:  Patient will be free of SI for at least 48 hours. Patient will complete the safety plan to reduce suicide risk.    Problem: Mood Impairment (Depressive Signs/Symptoms) (Adult)  Goal: Improved Mood Symptoms (Depressive Signs/Symptoms)    10/14/15 1314   Improved Mood Symptoms (Depressive Signs/Symptoms)   Improved Mood Symptoms Time Frame for Action Step (STG) other (see comments)  (7 days)   Patient endorses thoughts of hopelessness and feels like a failure.  Patient reports feeling depressed.     Goal:  Patient will report an improved mood. Patient will report feeling more confident with himself and his abilities.        Problem: Social/Occupational/Functional Impairment (Depressive Signs/Symptoms) (Adult)  Goal: Improved Social/Occupational/Functional Skills (Depressive Signs/Symptoms)    10/14/15 1314   Improved Social/Occupational/Functional Skills (Depressive Signs/Symptoms)   Improved Social/Occupational/Functional Skills Time Frame for Action Step (STG) other (see comments)  (6 days)   Patient reports he spent over $3,000 on drugs within the last month.  Patient endorses thoughts of hopelessness and feels like a failure.  Patient reports feeling depressed.     Goal:  Patient will learn 5 triggers of his drug usage. Patient learn 10 healthy coping skills to help with his drug usage.  Patient will develop a relapse prevention plan for himself.

## 2015-10-14 NOTE — Nurses Notes (Signed)
Type of group: Wrap Up  Group technique: Interactive discussion--Educated regarding goals and coping skills.  Time group initiated: 2030  Length of group time: 30  Patient's mental status/affect: Patient affect was flat, poor eye contact while talking.  Patient's behavior: Patient states that his goal for the day was "to stay alive". He said he "meet this goal".  Patient's response: Patient said that his day was "stressful"

## 2015-10-14 NOTE — Nurses Notes (Signed)
End of Shift Note   7p-7a shift -    All medications administered as documented and appear to be effective without adverse reactions unless otherwise documented. Satisfactory sleep pattern this shift -   Patient denies suicidal and homicidal ideations. Patient denies hallucinations. Will continue to monitor q. 15 minute checks

## 2015-10-14 NOTE — Nurses Notes (Addendum)
Clonidine for withdrawal s/s. Remeron for difficulty falling asleep.PRN medication given per pt request.     0720 Above medication effective , VSS. Slept through the night

## 2015-10-15 MED ORDER — HYDROXYZINE PAMOATE 25 MG CAPSULE
25.00 mg | ORAL_CAPSULE | Freq: Four times a day (QID) | ORAL | Status: DC | PRN
Start: 2015-10-15 — End: 2015-10-16
  Administered 2015-10-15 – 2015-10-16 (×4): 25 mg via ORAL
  Filled 2015-10-15 (×5): qty 1

## 2015-10-15 MED ORDER — VENLAFAXINE ER 75 MG CAPSULE,EXTENDED RELEASE 24 HR
75.00 mg | ORAL_CAPSULE | ORAL | Status: DC
Start: 2015-10-16 — End: 2015-10-16
  Administered 2015-10-16: 75 mg via ORAL
  Filled 2015-10-15: qty 1

## 2015-10-15 MED ORDER — CYCLOBENZAPRINE 10 MG TABLET
10.00 mg | ORAL_TABLET | Freq: Two times a day (BID) | ORAL | Status: DC | PRN
Start: 2015-10-15 — End: 2015-10-16
  Administered 2015-10-15 – 2015-10-16 (×3): 10 mg via ORAL
  Filled 2015-10-15 (×3): qty 1

## 2015-10-15 MED ADMIN — lactated Ringers intravenous solution: ORAL | @ 21:00:00 | NDC 00338011704

## 2015-10-15 MED ADMIN — sodium chloride 0.9 % intravenous solution: ORAL | @ 15:00:00 | NDC 00338004904

## 2015-10-15 MED ADMIN — albuterol sulfate 2.5 mg/3 mL (0.083 %) solution for nebulization: TRANSDERMAL | @ 09:00:00

## 2015-10-15 MED ADMIN — DEXTROSE 10% NORMAL SALINE W/ ADDITIVES: ORAL | @ 09:00:00

## 2015-10-15 NOTE — Nurses Notes (Signed)
Remeron per pt request for insomnia.

## 2015-10-15 NOTE — Nurses Notes (Signed)
Patient mood is irritable with congruent affect.  Multiple interactons with staff to argue-then re argue why he can not have prn Ativan.  CIWA scores never went over 2.  Parameters for prn Ativan reviewed with patient.  Patient showered, noted to be napping all day requiring several attempts to wake him up for meals or groups.  Patient denies current SI, current HI, current AVH.

## 2015-10-15 NOTE — Behavioral Health (Signed)
Type of group:  Psychoeducational Theme- Accepting Change  Group technique:  Handout and Discussion on the Stages of Change, Identifying our current stage, and self-empowerment to action steps.  Radical Acceptance also discussed.  Time group initiated:  1050  Length of group time:  45 Minutes  Patient's mental status/affect:  Hostile, externally focused  Patient's behavior:  Pt shared actively and expressed helplessness over making changes in his life.  Pt engaged in a negative exchange using profanity directed toward another pt.  Pt did accept redirection to stop from this Clinical research associatewriter.  Patient's response:  Pt states that medication has not made his life better and that he is looking for the right medication that will.  Pt states that he has been sober before and was merely existing and was not happy.  Pt was unable to identify any action steps he could take to assist with behavioral change.

## 2015-10-15 NOTE — Progress Notes (Signed)
[facility]    Behavioral Health Progress Note      Hilton SinclairJoseph [name], 35 y.o.  Date of Birth:  03/31/1980  MRN:  [id number]  CSN:  [id number]    Subjective:He reports his mood not good as he was upset in group, reports one of the staff was disrespectful to him in group. He reports Vistaril helped him but since this altercation he is upset again. He reports he continues to have withdrawal as per him, but not scoring on CIWA. He reports backacke and muscle relaxant helps. He reports having suicidal ideation with a plan to overdose. No homicidal ideations reported. He reports nightmares reported. No side effects reported. He reported having anxiety. As per staff reports patient had been argument ive, demanding and disrespectful to staff sometimes.     Objective:     Pt condition today:  worse  EXAM  Temperature: 36.9 C (98.4 F)  BP (Non-Invasive): 134/69  Heart Rate: 82  Respiratory Rate: 18  Pain Score (Numeric, Faces): 2  SpO2-1: 100 %   Appearance:  casually dressed   Behavior:  cooperative and eye contact  Fair   Motor/MS:  normal gait   Speech:  normal   Mood:  sad and irritable   Affect:  consistent with mood   Perception:  normal   Thought:  goal directed/coherent   Thought Content:  suicidal    Suicidal Ideation:  ideation with a plan--to overdose.     Homicidal Ideation:  none   Level of Consciousness:  alert   Orientation:  oriented to person, oriented to place and oriented to time   Concentration:  fair   Memory:  grossly normal   Conceptual:  abstract thinking   Judgement:  poor   Insight:  poor      Lab Results reviewed: Lab Results for Last 24 Hours:  No results found for any visits on 10/13/15 (from the past 24 hour(s)).      Current Facility-Administered Medications:  acetaminophen (TYLENOL) tablet 650 mg Oral Q4H PRN   aluminum-magnesium hydroxide-simethicone (MAG-AL PLUS) 200-200-20 mg per 5 mL oral liquid 30 mL Oral Q4H PRN   cloNIDine (CATAPRES) tablet 0.1 mg Oral Q4H PRN      dicyclomine (BENTYL) capsule 20 mg Oral Q4H PRN   diphenhydrAMINE (BENADRYL) capsule 50 mg Oral Q6H PRN   Or      diphenhydrAMINE (BENADRYL) 50 mg/mL injection 50 mg Intramuscular Q6H PRN   gabapentin (NEURONTIN) capsule 300 mg Oral 3x/day   haloperidol (HALDOL) tablet 5 mg Oral Q6H PRN   Or      haloperidol (HALDOL) 5 mg/mL injection 5 mg Intramuscular Q6H PRN   hydrOXYzine pamoate (VISTARIL) capsule 25 mg Oral 4x/day PRN   loperamide (IMODIUM) capsule 2 mg Oral Q1H PRN   LORazepam (ATIVAN) tablet 1 mg Oral Q4H PRN   magnesium hydroxide (MILK OF MAGNESIA) 400mg  per 5mL oral liquid 30 mL Oral HS PRN   meloxicam (MOBIC) tablet 7.5 mg Oral Daily   mirtazapine (REMERON) tablet 7.5 mg Oral HS PRN - MR x 1   nicotine (NICODERM CQ) transdermal patch (mg/24 hr) 21 mg Transdermal Daily   nicotine polacrilex (NICORETTE) chewing gum 2 mg Oral Q1H PRN   ondansetron (ZOFRAN ODT) rapid dissolve tablet 4 mg Oral Q6H PRN   venlafaxine (EFFEXOR XR) 24 hr extended release capsule 37.5 mg Oral Q24H       ASSESSMENT    I - substance induced depressive d/o; cocaine use d/o, severe dependence; alcohol  use d/o, moderate; stimulant use d/o, mild; cannabis use d/o, mild  II - deferred  III - none  IV - homeless, unemployed, single    Plan:  1. Disposition: Patient will be admitted to inpatient psychiatry for acute stabilization of suicidal thoughts while monitoring detox. Will likely be discharged in 3-4 dayor when there is an absence of suicidal thoughtsx48hours.   2. Legal status: voluntary. [facility].   3. Meds - Increase Effexor XR to 75  mg qd for depression. Will continue CIWA with Ativan PRN coverage. Start Vistaril 25 mg qid prn for anxiety.  4. Psychoeducation - Risks and benefits of medications discussed and patient verbalized understanding.   5. Medical comorbidities - reports backacke and muscle spasm, Flexeril 10 mg bid prn  6. Labs/work-up - none  7. Social issues - patient has own transportation home  8.  Precautions -routine  9. Therapy - encouraged to attend all groups; provide milieu therapy and brief individual therapy as appropriate.

## 2015-10-15 NOTE — Care Plan (Signed)
Problem: Patient Care Overview (Adult,OB)  Goal: Plan of Care Review(Adult,OB)  The patient and/or their representative will communicate an understanding of their plan of care   Outcome: Ongoing (see interventions/notes)    Goal: Individualization/Patient Specific Goal(Adult/OB)  Outcome: Ongoing (see interventions/notes)    Goal: Interdisciplinary Rounds/Family Conf  Outcome: Ongoing (see interventions/notes)      Problem: BH General Plan of Care (ADULT)  Goal: Plan of Care Review  Outcome: Ongoing (see interventions/notes)    Goal: Interdisciplinary Rounds/Family Conf  Outcome: Ongoing (see interventions/notes)    Goal: Individualization & Mutuality  Outcome: Ongoing (see interventions/notes)      Problem: Overarching Goals (Adult)  Goal: Adheres to Safety Considerations for Self and Others  Outcome: Ongoing (see interventions/notes)    Goal: Optimized Coping Skills in Response to Life Stressors  Outcome: Ongoing (see interventions/notes)    Goal: Develops/Participates in Therapeutic Alliance to Support Successful Transition  Outcome: Ongoing (see interventions/notes)      Problem: Suicidal Behavior (Adult)  Goal: Suicidal Behavior is Absent/Minimized/Managed  Outcome: Ongoing (see interventions/notes)      Problem: Mood Impairment (Depressive Signs/Symptoms) (Adult)  Goal: Improved Mood Symptoms (Depressive Signs/Symptoms)  Outcome: Ongoing (see interventions/notes)      Problem: Social/Occupational/Functional Impairment (Depressive Signs/Symptoms) (Adult)  Goal: Improved Social/Occupational/Functional Skills (Depressive Signs/Symptoms)  Outcome: Ongoing (see interventions/notes)

## 2015-10-15 NOTE — Nurses Notes (Signed)
Patient up on unit for meals, meds and groups. He is med seeking and staff seeking .      Report having withdrawal symptom. CIWA  Completed pt score clonidine given as order and  Remeron for sleep .Came back 5 min's later requesting to have his ativan encourage allow medication to work and Clinical research associatewriter will evaluate effectiveness.  Medication was effective , pt observe in bed snoring loudly.       Late in AM pt came to writer requesting ativan for  Withdrawal  Symptoms and that he has no slept all night. While talking to write he could hardly keep his eyes open. CIWA score at , B/P 102/66 NA HR 72. Resp. 16 due to Score and  VSS  Writer explain to  Pt that he did not meet parameter. He got very upset yelling and cuss down  The  Requesting to be discharge this morning. 5 min's later staff went to check on pt  He was asleep snoring loudly.      Report that he  Has been in rehab several time and it doesn't work for me for me , they don't help me.     Denies all suicidal and homicidal ideations, and audio or visual hallucinations.    Affects blunt ,eye cntact appropriate. Dressing in personal clothing  Unkempt.    Patient  Slept well , will continue to monitor  Q 15 min's

## 2015-10-15 NOTE — Care Plan (Signed)
Problem: Patient Care Overview (Adult,OB)  Goal: Plan of Care Review(Adult,OB)  The patient and/or their representative will communicate an understanding of their plan of care   Outcome: Ongoing (see interventions/notes)    Goal: Individualization/Patient Specific Goal(Adult/OB)  Outcome: Ongoing (see interventions/notes)    Goal: Interdisciplinary Rounds/Family Conf  Outcome: Ongoing (see interventions/notes)      Problem: BH General Plan of Care (ADULT)  Goal: Plan of Care Review  Outcome: Ongoing (see interventions/notes)    Goal: Individualization & Mutuality  Outcome: Ongoing (see interventions/notes)      Problem: Overarching Goals (Adult)  Goal: Adheres to Safety Considerations for Self and Others  Outcome: Ongoing (see interventions/notes)    Goal: Optimized Coping Skills in Response to Life Stressors  Outcome: Ongoing (see interventions/notes)    Goal: Develops/Participates in Therapeutic Alliance to Support Successful Transition  Outcome: Ongoing (see interventions/notes)

## 2015-10-15 NOTE — Nurses Notes (Signed)
Patient required multiple intervention for his behavior on the unit.  He was overheard calling another patient "four eyes, why don't you go wash your face (pt. has acne), you're ignorant, quit running your fucking mouth" and repeatedly called this other patient a "bitch".  It has been observed that the other patient was sitting quietly in the dining room, drinking coffee and reading a magazine at the time of this interaction.  Spoke with patient along with charge RN regarding patient's inappropriate interactions with the other patient.  Pt. Verbalized understanding that his behavior was inappropriate and agreed to seek out staff should he feel irritated by another patient.

## 2015-10-15 NOTE — Nurses Notes (Signed)
Type of group:  Wrap Up  Group technique:  Interactive discussion--Educated regarding goals and coping skills.  Time group initiated:  2030  Length of group time:  30  Patient's mental status/affect:  rude , blunt affect.  Patient's behavior:  participate when called upon ,  Burp several times in group , rattle potato chip bag. Made  Rude comment under breath about rehab does not work for him.   Patient's response:  Patient's goal for the day was "get treatment plan started." He verbalized his goal as being  met. Relates a coping skill that works for him is "walking He report in group that  He saw the doctor today and doesn't/t thing anything is different and that he sleep though the shitty day.

## 2015-10-15 NOTE — Behavioral Health (Signed)
Type of group:  Process  Group technique:  Verbalization of feelings, open discussion using CBT and supportive psychotherapy.  Time group initiated:  1430  Length of group time:  1 Hour  Patient's mental status/affect:  Engaged, distracted  Patient's behavior:  Pt exited and reentered group several times.  Pt made appropriate eye contact and shared when prompted.  Patient's response:  Pt shared that he is trying to get through withdrawal and is focusing on taking things one day at a time.  Pt shared that he is not sure where he wants to go when discharged and may stay locally.

## 2015-10-15 NOTE — Nurses Notes (Signed)
Nicorette gum per pt request.

## 2015-10-15 NOTE — Behavioral Health (Signed)
This Clinical research associatewriter inquired if pt has completed Administrator, artsersonal Safety Plan to Reduce Suicide Risk.  Pt has not.  Pt states will work on plan at later time prior to discharge.

## 2015-10-15 NOTE — Nurses Notes (Signed)
Type of group: Nursing Group  Group technique:Interactive discussion--Coping with the 5 stages of denial to acceptance.  Time group initiated: 1545  Length of group time: 45  Patient's mental status/affect: Irritable, ego centric.  Patient's behavior: Pt was disruptive in group, arguing over topics that were not the group focus, with group leader.  Patient's response:Pt asked to leave the group which he did without issue.

## 2015-10-15 NOTE — Nurses Notes (Signed)
Patient noted to be angry , report that he was being dis-respectable by one of the male pt on unit.There were heard arguing in  Dining room and was seperate by staff.   He was enjoy to stay in one dining area  and male patient stays in the other dayroom and to address any concerns with this write or to other nursing staff and not to male pt.     He alert and oriented. Pupils equal and reactive. Skin warm and dry to touch. No diaphoresis or tremor noted. No withdrawal symptoms noted.    Denies suicidal, homicidal ideations no plans or intent . Denies preparatory behaviors. Denies visual and auditory hallucinations.    Blunt affects. Moods "irritable". Out in milieu interact with male staff. Denies N/V/D. Will continue to monitor Q 15 min's.

## 2015-10-15 NOTE — Nurses Notes (Signed)
Attended AA 

## 2015-10-16 MED ORDER — VENLAFAXINE ER 75 MG CAPSULE,EXTENDED RELEASE 24 HR
75.00 mg | ORAL_CAPSULE | ORAL | 0 refills | Status: DC
Start: 2015-10-16 — End: 2015-10-20

## 2015-10-16 MED ORDER — NICOTINE 21 MG/24 HR DAILY TRANSDERMAL PATCH
21.0000 mg | MEDICATED_PATCH | Freq: Every day | TRANSDERMAL | 0 refills | Status: DC
Start: 2015-10-16 — End: 2015-10-20

## 2015-10-16 MED ORDER — MIRTAZAPINE 7.5 MG TABLET
7.50 mg | ORAL_TABLET | Freq: Every evening | ORAL | 0 refills | Status: AC | PRN
Start: 2015-10-16 — End: ?

## 2015-10-16 MED ADMIN — hydrOXYzine pamoate 25 mg capsule: ORAL | @ 08:00:00

## 2015-10-16 MED ADMIN — sodium chloride 0.9 % (flush) injection syringe: ORAL | @ 08:00:00

## 2015-10-16 NOTE — Behavioral Health (Signed)
Met with pt to discuss oupt f/u and tx options. Pt unsure if he wants to go back to [place] or will stay in [place]. Pt states that he "can't do outpt."

## 2015-10-16 NOTE — Behavioral Health (Signed)
Spoke to pt about completing safety plan to reduce suicide risk. Pt states that he will work on it

## 2015-10-16 NOTE — Nurses Notes (Signed)
PRN  Pt received Clonidine for c/o withdrawal S/S of agitation/anxiety.  BP and Pulse were within parameters.

## 2015-10-16 NOTE — Behavioral Health (Addendum)
Pt states that he would like to be d/c and that he does not want f/u treatment. Pt states that he has no insurance, no ID and is from another state. Provided pt with information for the [facility] in [place] along with an application.

## 2015-10-16 NOTE — Nurses Notes (Signed)
PT received flexeril at 1650 for c/o back/neck cramping/pain.  No further c/o at this time. Pt eating well, attending all meals but has not attended any groups.  Pt has been calm and cooperative.  Will continue to monitor Q 15 mins.

## 2015-10-16 NOTE — Care Plan (Signed)
Problem: Patient Care Overview (Adult,OB)  Goal: Plan of Care Review(Adult,OB)  The patient and/or their representative will communicate an understanding of their plan of care   Outcome: Ongoing (see interventions/notes)    Goal: Individualization/Patient Specific Goal(Adult/OB)  Outcome: Ongoing (see interventions/notes)    Goal: Interdisciplinary Rounds/Family Conf  Outcome: Ongoing (see interventions/notes)      Problem: BH General Plan of Care (ADULT)  Goal: Plan of Care Review  Outcome: Ongoing (see interventions/notes)    Goal: Interdisciplinary Rounds/Family Conf  Outcome: Ongoing (see interventions/notes)    Goal: Individualization & Mutuality  Outcome: Ongoing (see interventions/notes)      Problem: Overarching Goals (Adult)  Goal: Adheres to Safety Considerations for Self and Others  Outcome: Ongoing (see interventions/notes)    Goal: Optimized Coping Skills in Response to Life Stressors  Outcome: Ongoing (see interventions/notes)    Goal: Develops/Participates in Therapeutic Alliance to Support Successful Transition  Outcome: Ongoing (see interventions/notes)      Problem: Suicidal Behavior (Adult)  Goal: Suicidal Behavior is Absent/Minimized/Managed  Outcome: Ongoing (see interventions/notes)      Problem: Mood Impairment (Depressive Signs/Symptoms) (Adult)  Goal: Improved Mood Symptoms (Depressive Signs/Symptoms)  Outcome: Ongoing (see interventions/notes)      Problem: Social/Occupational/Functional Impairment (Depressive Signs/Symptoms) (Adult)  Goal: Improved Social/Occupational/Functional Skills (Depressive Signs/Symptoms)  Outcome: Ongoing (see interventions/notes)

## 2015-10-16 NOTE — Nurses Notes (Signed)
Patient discharged on 10/16/2015 at 1830  with family/friend. Discharge instructions given to patient/family per order. Patient/family verbalized understanding. Patient/family instructed to contact physician with any questions/concerns.  Belongings and medications returned to patient. Pt had no follow up appointment.

## 2015-10-16 NOTE — Nurses Notes (Signed)
PRN  Pt requested/received Nicorette gum, Vistaril for c/o anxiety, Tylenol for c/o headache and generalized pain and stated that the medication didn't help that much that was given to him earlier, because he just doesn't feel good and just wants to sleep.

## 2015-10-16 NOTE — Nurses Notes (Signed)
Type of group:  Wrap Up  Group technique:  Interactive discussion--Educated regarding goals and coping skills.  Time group initiated:  2045  Length of group time:  15  Patient's mental status/affect:  blunt  Patient's behavior:  Cooperative and participative.  Patient's response:  Patient's goal for the day was "make it through the day." He verbalized his goal as being met. Relates a coping skill that works for him is "lay in room and talk with peers

## 2015-10-16 NOTE — Behavioral Health (Signed)
Safety plan to reduce suicide risk completed and copy placed in the chart.

## 2015-10-16 NOTE — Behavioral Health (Signed)
Type of group: Psychoeducational. Theme: Remain Teachable  Group technique: Open discussion on methods for remaining teachable and how to incorporate those methods into daily living  Time group initiated:  1045  Length of group time: 60 minutes  Patient's mental status/affect: Pt invited to group but did not attend group  Patient's behavior: Pt invited to group but did not attend group  Patient's response: Pt invited to group but did not attend group

## 2015-10-16 NOTE — Nurses Notes (Signed)
Pt lying in bed at 1000 sleeping.  Did not awaken him.

## 2015-10-16 NOTE — Nurses Notes (Signed)
PRN  Pt received Vistaril, Bentyl, Tylenol, flexeril at 0801 for c/o withdrawal S/S. Medication were not effective, pt stated. Pt appeared to be sleeping most of the time between meals today.

## 2015-10-16 NOTE — Behavioral Health (Signed)
Type of group: Process- Depression and goals to help reduction of symptoms   Group technique: Process/ discussion based group focused on individual needs and goals.   Time group initiated: 230pm  Length of group time: 60 min  Patient's mental status/affect:  Pt was quiet, but easily engaged, and openly communicated. Smiled appropriately at times. Depressed.   Patient's behavior: Pt was cooperative and engaged when spoken directly to.   Patient's response: Pt discussed anxiety and fears surrounding discharging and relapse.

## 2015-10-17 ENCOUNTER — Encounter (HOSPITAL_BASED_OUTPATIENT_CLINIC_OR_DEPARTMENT_OTHER): Payer: Self-pay

## 2015-10-17 ENCOUNTER — Inpatient Hospital Stay
Admission: EM | Admit: 2015-10-17 | Discharge: 2015-10-20 | DRG: 897 | Disposition: A | Discharge status: Home / Self Care | Payer: Self-pay | Source: Ambulatory Visit | Attending: Psychiatry | Admitting: Psychiatry

## 2015-10-17 ENCOUNTER — Emergency Department (HOSPITAL_BASED_OUTPATIENT_CLINIC_OR_DEPARTMENT_OTHER)
Admission: EM | Admit: 2015-10-17 | Discharge: 2015-10-17 | Disposition: A | Discharge status: Other Acute Inpatient Hospital | Payer: Self-pay | Attending: Emergency Medicine | Admitting: Emergency Medicine

## 2015-10-17 DIAGNOSIS — F17210 Nicotine dependence, cigarettes, uncomplicated: Secondary | ICD-10-CM

## 2015-10-17 DIAGNOSIS — F1910 Other psychoactive substance abuse, uncomplicated: Secondary | ICD-10-CM

## 2015-10-17 DIAGNOSIS — F1010 Alcohol abuse, uncomplicated: Secondary | ICD-10-CM

## 2015-10-17 DIAGNOSIS — F1590 Other stimulant use, unspecified, uncomplicated: Secondary | ICD-10-CM | POA: Diagnosis present

## 2015-10-17 DIAGNOSIS — G8929 Other chronic pain: Secondary | ICD-10-CM | POA: Diagnosis present

## 2015-10-17 DIAGNOSIS — F1420 Cocaine dependence, uncomplicated: Secondary | ICD-10-CM | POA: Diagnosis present

## 2015-10-17 DIAGNOSIS — Z79899 Other long term (current) drug therapy: Secondary | ICD-10-CM

## 2015-10-17 DIAGNOSIS — R030 Elevated blood-pressure reading, without diagnosis of hypertension: Secondary | ICD-10-CM | POA: Insufficient documentation

## 2015-10-17 DIAGNOSIS — F1994 Other psychoactive substance use, unspecified with psychoactive substance-induced mood disorder: Secondary | ICD-10-CM

## 2015-10-17 DIAGNOSIS — Z813 Family history of other psychoactive substance abuse and dependence: Secondary | ICD-10-CM

## 2015-10-17 DIAGNOSIS — F1290 Cannabis use, unspecified, uncomplicated: Secondary | ICD-10-CM | POA: Diagnosis present

## 2015-10-17 DIAGNOSIS — M545 Low back pain: Secondary | ICD-10-CM | POA: Diagnosis present

## 2015-10-17 DIAGNOSIS — F1110 Opioid abuse, uncomplicated: Secondary | ICD-10-CM | POA: Diagnosis present

## 2015-10-17 DIAGNOSIS — Z716 Tobacco abuse counseling: Secondary | ICD-10-CM | POA: Insufficient documentation

## 2015-10-17 DIAGNOSIS — F419 Anxiety disorder, unspecified: Secondary | ICD-10-CM

## 2015-10-17 DIAGNOSIS — Z818 Family history of other mental and behavioral disorders: Secondary | ICD-10-CM

## 2015-10-17 DIAGNOSIS — Z560 Unemployment, unspecified: Secondary | ICD-10-CM

## 2015-10-17 DIAGNOSIS — R45851 Suicidal ideations: Secondary | ICD-10-CM

## 2015-10-17 DIAGNOSIS — F0630 Mood disorder due to known physiological condition, unspecified: Secondary | ICD-10-CM | POA: Diagnosis present

## 2015-10-17 DIAGNOSIS — F1410 Cocaine abuse, uncomplicated: Secondary | ICD-10-CM | POA: Insufficient documentation

## 2015-10-17 DIAGNOSIS — Z590 Homelessness: Secondary | ICD-10-CM

## 2015-10-17 DIAGNOSIS — Z9049 Acquired absence of other specified parts of digestive tract: Secondary | ICD-10-CM

## 2015-10-17 LAB — ETHANOL, SERUM: ETHANOL: NOT DETECTED

## 2015-10-17 LAB — CBC WITH DIFF
BASOPHIL #: 0.1 x10ˆ3/uL (ref 0.00–0.10)
BASOPHIL %: 1 % (ref 0–3)
EOSINOPHIL #: 0 x10ˆ3/uL (ref 0.00–0.50)
EOSINOPHIL %: 0 % (ref 0–5)
HCT: 47.2 % (ref 40.0–50.0)
HGB: 15.6 g/dL (ref 13.5–18.0)
LYMPHOCYTE #: 1.5 x10ˆ3/uL (ref 1.00–4.80)
LYMPHOCYTE %: 13 % — ABNORMAL LOW (ref 15–43)
MCH: 29.9 pg (ref 27.5–33.2)
MCHC: 33.1 g/dL (ref 32.0–36.0)
MCV: 90.3 fL (ref 82.0–97.0)
MONOCYTE #: 0.5 x10ˆ3/uL (ref 0.20–0.90)
MONOCYTE %: 4 % — ABNORMAL LOW (ref 5–12)
MPV: 9.6 fL (ref 7.4–10.5)
NEUTROPHIL #: 9.5 x10ˆ3/uL — ABNORMAL HIGH (ref 1.50–6.50)
NEUTROPHIL %: 82 % — ABNORMAL HIGH (ref 43–76)
PLATELETS: 226 x10ˆ3/uL (ref 150–450)
RBC: 5.23 x10ˆ6/uL (ref 4.40–5.80)
RDW: 13.5 % (ref 11.0–16.0)
WBC: 11.6 x10ˆ3/uL — ABNORMAL HIGH (ref 4.0–11.0)

## 2015-10-17 LAB — ETHANOL, SERUM/PLASMA: ETHANOL: <10 mg/dL (ref <10)

## 2015-10-17 LAB — COMPREHENSIVE METABOLIC PROFILE - BMC/JMC ONLY
ALBUMIN/GLOBULIN RATIO: 1.8 (ref 0.8–2.0)
ALBUMIN: 4.4 g/dL (ref 3.5–5.0)
ALKALINE PHOSPHATASE: 67 U/L (ref 38–126)
ALT (SGPT): 55 U/L (ref 17–63)
ANION GAP: 10 mmol/L (ref 3–11)
AST (SGOT): 34 U/L (ref 15–41)
BILIRUBIN TOTAL: 0.5 mg/dL (ref 0.3–1.2)
BUN/CREA RATIO: 12 (ref 6–22)
BUN: 10 mg/dL (ref 6–20)
BUN: 10 mg/dL (ref 6–20)
CALCIUM: 10.1 mg/dL (ref 8.6–10.3)
CHLORIDE: 102 mmol/L (ref 101–111)
CO2 TOTAL: 25 mmol/L (ref 22–32)
CREATININE: 0.84 mg/dL (ref 0.61–1.24)
ESTIMATED GFR: >60 mL/min/1.73mˆ2 (ref >60)
GLUCOSE: 108 mg/dL (ref 70–110)
POTASSIUM: 4.2 mmol/L (ref 3.4–5.1)
PROTEIN TOTAL: 6.8 g/dL (ref 6.4–8.3)
SODIUM: 137 mmol/L (ref 136–145)

## 2015-10-17 LAB — URINALYSIS WITH MICROSCOPIC REFLEX IF INDICATED BMC/JMC ONLY
BILIRUBIN: NEGATIVE mg/dL
BLOOD: NEGATIVE mg/dL
GLUCOSE: NEGATIVE mg/dL
KETONES: NEGATIVE mg/dL
NITRITE: NEGATIVE
PH: 6 (ref <8.0)
PROTEIN: NEGATIVE mg/dL
SPECIFIC GRAVITY: 1.004 (ref <1.022)
UROBILINOGEN: <2 mg/dL (ref <=2.0)

## 2015-10-17 LAB — DRUG SCREEN,URINE - BMC/JMC ONLY
AMPHETAMINES URINE: NEGATIVE
BARBITURATES URINE: NEGATIVE
BENZODIAZEPINES URINE: NEGATIVE
CANNABINOIDS URINE: NEGATIVE
COCAINE METABOLITES URINE: POSITIVE — AB
METHADONE URINE: NEGATIVE
OPIATES URINE: NEGATIVE
OXYCODONE URINE: NEGATIVE
PCP URINE: NEGATIVE
TRICYCLIC ANTIDEPRESSANTS URINE: NEGATIVE

## 2015-10-17 MED ORDER — ACETAMINOPHEN 325 MG TABLET
650.0000 mg | ORAL_TABLET | ORAL | Status: DC | PRN
Start: 2015-10-17 — End: 2015-10-20
  Administered 2015-10-17 – 2015-10-19 (×2): 650 mg via ORAL
  Filled 2015-10-17 (×2): qty 2

## 2015-10-17 MED ORDER — VENLAFAXINE ER 75 MG CAPSULE,EXTENDED RELEASE 24 HR
75.00 mg | ORAL_CAPSULE | ORAL | Status: DC
Start: 2015-10-17 — End: 2015-10-20
  Administered 2015-10-17 – 2015-10-19 (×3): 75 mg via ORAL
  Filled 2015-10-17 (×3): qty 1

## 2015-10-17 MED ORDER — DIPHENHYDRAMINE 50 MG/ML INJECTION SOLUTION
50.0000 mg | Freq: Four times a day (QID) | INTRAMUSCULAR | Status: DC | PRN
Start: 2015-10-17 — End: 2015-10-20

## 2015-10-17 MED ORDER — MAGNESIUM HYDROXIDE 400 MG/5 ML ORAL SUSPENSION
30.0000 mL | Freq: Every evening | ORAL | Status: DC | PRN
Start: 2015-10-17 — End: 2015-10-20

## 2015-10-17 MED ORDER — NICOTINE 21 MG/24 HR DAILY TRANSDERMAL PATCH
21.0000 mg | MEDICATED_PATCH | Freq: Every day | TRANSDERMAL | Status: DC
Start: 2015-10-17 — End: 2015-10-20
  Administered 2015-10-17 – 2015-10-20 (×6): 21 mg via TRANSDERMAL
  Administered 2015-10-20: 0 mg via TRANSDERMAL
  Filled 2015-10-17 (×4): qty 1

## 2015-10-17 MED ORDER — OLANZAPINE 10 MG INTRAMUSCULAR SOLUTION
5.0000 mg | Freq: Four times a day (QID) | INTRAMUSCULAR | Status: DC | PRN
Start: 2015-10-17 — End: 2015-10-20

## 2015-10-17 MED ORDER — DIPHENHYDRAMINE 50 MG CAPSULE
50.0000 mg | ORAL_CAPSULE | Freq: Four times a day (QID) | ORAL | Status: DC | PRN
Start: 2015-10-17 — End: 2015-10-20

## 2015-10-17 MED ORDER — CYCLOBENZAPRINE 10 MG TABLET
10.00 mg | ORAL_TABLET | Freq: Two times a day (BID) | ORAL | Status: DC | PRN
Start: 2015-10-17 — End: 2015-10-20
  Administered 2015-10-17 – 2015-10-20 (×7): 10 mg via ORAL
  Filled 2015-10-17 (×7): qty 1

## 2015-10-17 MED ORDER — NICOTINE (POLACRILEX) 2 MG GUM
4.00 mg | CHEWING_GUM | BUCCAL | Status: DC | PRN
Start: 2015-10-17 — End: 2015-10-20
  Administered 2015-10-18 – 2015-10-19 (×8): 4 mg via ORAL
  Filled 2015-10-17 (×3): qty 2
  Filled 2015-10-17: qty 4
  Filled 2015-10-17 (×3): qty 2
  Filled 2015-10-17: qty 1

## 2015-10-17 MED ORDER — MIRTAZAPINE 15 MG TABLET
7.5000 mg | ORAL_TABLET | Freq: Every evening | ORAL | Status: DC | PRN
Start: 2015-10-17 — End: 2015-10-20
  Administered 2015-10-17 – 2015-10-19 (×3): 7.5 mg via ORAL
  Filled 2015-10-17 (×3): qty 1

## 2015-10-17 MED ORDER — OLANZAPINE 5 MG DISINTEGRATING TABLET
5.0000 mg | ORAL_TABLET | Freq: Three times a day (TID) | ORAL | Status: DC | PRN
Start: 2015-10-17 — End: 2015-10-20

## 2015-10-17 MED ORDER — HYDROXYZINE PAMOATE 25 MG CAPSULE
25.0000 mg | ORAL_CAPSULE | Freq: Three times a day (TID) | ORAL | Status: DC | PRN
Start: 2015-10-17 — End: 2015-10-20
  Administered 2015-10-17 – 2015-10-19 (×5): 25 mg via ORAL
  Filled 2015-10-17 (×5): qty 1

## 2015-10-17 MED ORDER — ALUMINUM-MAG HYDROXIDE-SIMETHICONE 200 MG-200 MG-20 MG/5 ML ORAL SUSP
30.0000 mL | ORAL | Status: DC | PRN
Start: 2015-10-17 — End: 2015-10-20

## 2015-10-17 MED ORDER — NICOTINE (POLACRILEX) 2 MG GUM
2.0000 mg | CHEWING_GUM | BUCCAL | Status: DC | PRN
Start: 2015-10-17 — End: 2015-10-17

## 2015-10-17 NOTE — Nurses Notes (Signed)
PRN Medications    PRN Tyenol 650 mg given for chronic back pain, Flexeril 10 mg given for muscle discomfort, and Vistaril 25 mg given for increased anxiety. Will continue to monitor for effectiveness.

## 2015-10-17 NOTE — Nurses Notes (Signed)
Type of group:Education  Group technique:utilization of questions from healthy  Body healthy mind cards  Time group initiated1530  Length of group time:30 min.  Patient's mental status/sad affect  Patient's behavior:alert  Patient's response:patient attended late after seen by therapist, willing participated  With questions

## 2015-10-17 NOTE — ED Triage Notes (Signed)
Pt here with suicidal thoughts following drug use at outside motel. Called EMS and stated "he had nowhere to go".

## 2015-10-17 NOTE — ED Nurses Note (Signed)
Pt given warm food at this time. No other needs voiced.

## 2015-10-17 NOTE — Behavioral Health (Signed)
Type of group: Process  Group technique: Utilized CBT and person centered strategies to identify negative thinking patterns and ways to incorporate positive self-talk  Time group initiated: 14:40  Length of group time: 50 minutes  Patient's mental status/affect: Pt alert, oriented. Affect: Flat  Patient's behavior: Pt calm, cooperative, and actively participated in group  Patient's response: Pt shared that he struggles with sabotaging himself and becoming the "labels" that others have for him.

## 2015-10-17 NOTE — ED Nurses Note (Signed)
Crisis worker Jane at bedside.

## 2015-10-17 NOTE — Care Plan (Signed)
Problem: Patient Care Overview (Adult,OB)  Goal: Plan of Care Review(Adult,OB)  The patient and/or their representative will communicate an understanding of their plan of care   Outcome: Ongoing (see interventions/notes)  Goal: Individualization/Patient Specific Goal(Adult/OB)  Outcome: Ongoing (see interventions/notes)  Goal: Interdisciplinary Rounds/Family Conf  Outcome: Ongoing (see interventions/notes)    Problem: BH General Plan of Care (ADULT)  Goal: Plan of Care Review  Outcome: Ongoing (see interventions/notes)  Goal: Interdisciplinary Rounds/Family Conf  Outcome: Ongoing (see interventions/notes)  Goal: Individualization & Mutuality  Outcome: Ongoing (see interventions/notes)    Problem: Overarching Goals (Adult)  Goal: Adheres to Safety Considerations for Self and Others  Outcome: Ongoing (see interventions/notes)  Goal: Optimized Coping Skills in Response to Life Stressors  Outcome: Ongoing (see interventions/notes)  Goal: Develops/Participates in Therapeutic Alliance to Support Successful Transition  Outcome: Ongoing (see interventions/notes)

## 2015-10-17 NOTE — Behavioral Health (Signed)
EPS Note:  Pt presented to the ED via EMS from local hotel averring thoughts that he was afraid he might harm himself or relapse on drugs.  Per Pt, he got depressed, did some drugs, got more depressed and called 911.  Pt stated he wanted to kill himself, does not have a plan.  Pt is a poor historian; making conflicting statements.      Psychiatric: [facility] admission 08/18-21/17.  Pt reported a history of both inpatient and outpatient treatment over the past 20 years.      Substance Use:   Cigarettes-1 1/2 pks to 2 pks daily; occasionally chews tobacco.  Cocaine-smokes, snorts, injects; snorted tonight.  Alcohol-daily recently; Pt stated he had a few beers prior to coming into the ED as he didn't have any more money; BAL not detected upon ED admission.    Marital Status:  Single    Children:  None    Living Arrangements:  Homeless.      Education:  10th grade    Abuse Hx:  Denied    Diagnostic Impression:  Substance Induced Mood D/O    Disposition:  Pt is seeking readmission to [facility].  On-call psychiatrist will be consulted.  Report given to ED staff.

## 2015-10-17 NOTE — Behavioral Health (Signed)
Type of group: Psychoeducational. Theme: Imagine it, Plan it, Do it  Group technique: Open discussion, art therapy techniques to draw life maps and identify future goals  Time group initiated:  1055  Length of group time: 50 minutes  Patient's mental status/affect: Pt invited to group but did not participate in group  Patient's behavior: Pt invited to group but did not participate  Patient's response: Pt invited to group but did not participate

## 2015-10-17 NOTE — Behavioral Health (Signed)
Consulted Dr Drexler who has accepted Pt for admission to [facility] and will enter admission orders.  Pt and ED staff updated.

## 2015-10-17 NOTE — Nurses Notes (Signed)
ADMISSION NOTE   Patient admitted voluntarily to unit at 0500 from the ED accompanied by EPS worker, Erskine SquibbJane. Safety search and skin assessment completed by this RN with Erskine SquibbJane, EPS worker, present. No contraband found. Acne and several insect bites noted in various stages of healing.  He was admitted to the unit with a diagnosis of psychoactive substance induced mood disorder. He has a PMH of chronic pain to his back and joints due to OA.  Patient reports his reason for admission as "thinking on how to kill myself again".  He reports having suicidal ideations for the past 2-3 years.  He verbalized having multiple stressors, "liars, users, finances, and unemployment".  He reports he has been out of a job for 2 weeks.  He denies having any homicidal ideations or hallucinations.  Patient expresess paranoid delusions due to the fact that he thinks that "everyone is talking and laughing about me".   He is a cigarette smoker and smokes 1-2 packs per day. He states that he uses crack cocaine and alcohol. He has received  treatment for drug/alcohol abuse "since I was 19".   He has no previous history of suicide attempts. He has previous inpatient psychiatric admission in the past, and it was to this [facility] unit yesterday, 10/16/15.  He does not have a PCP. He currently does not see Dr. for outpatient psychiatric services.   He has an education level is 10th grade. He is currently unemployed. He lives with his mother.  He was pleasant, calm, and cooperative during the admission assessment. His affect was flat with limited eye contact.   His VS were stable upon admission.   Orientation packet was given to and reviewed with the patient. Oriented to room, staff, unit rules, and unit. Encouraged to come to staff for questions and support. Patient opted to not be present during their belongings search. Will continue to monitor Q15 minutes.

## 2015-10-17 NOTE — Nurses Notes (Signed)
Patient reported this am "feeling bad all over", did not identify  Any stressors when asked to do so, reported "always suicidal with no plan , denied homicidal thopughts and all hallucinations, patient has attended, participated in scheduled groups,cont. Observation.

## 2015-10-17 NOTE — Care Plan (Signed)
Problem: Patient Care Overview (Adult,OB)  Goal: Plan of Care Review(Adult,OB)  The patient and/or their representative will communicate an understanding of their plan of care   Outcome: Ongoing (see interventions/notes)  Goal: Individualization/Patient Specific Goal(Adult/OB)  Outcome: Ongoing (see interventions/notes)  Goal: Interdisciplinary Rounds/Family Conf  Outcome: Ongoing (see interventions/notes)    Problem: BH General Plan of Care (ADULT)  Goal: Plan of Care Review  Outcome: Ongoing (see interventions/notes)  Goal: Interdisciplinary Rounds/Family Conf  Outcome: Ongoing (see interventions/notes)  Goal: Individualization & Mutuality  Outcome: Ongoing (see interventions/notes)    Problem: Overarching Goals (Adult)  Goal: Adheres to Safety Considerations for Self and Others  Outcome: Ongoing (see interventions/notes)  Goal: Optimized Coping Skills in Response to Life Stressors  Outcome: Ongoing (see interventions/notes)  Goal: Develops/Participates in Therapeutic Alliance to Support Successful Transition  Outcome: Ongoing (see interventions/notes)    Problem: Suicidal Behavior (Adult)  Goal: Suicidal Behavior is Absent/Minimized/Managed  Outcome: Ongoing (see interventions/notes)    Problem: Impaired Control (Excessive Substance Use) (Adult)  Goal: Participates in Recovery Program (Excessive Substance Use)  Outcome: Ongoing (see interventions/notes)    Problem: Social/Occupational/Functional Impairment (Excessive Substance Use) (Adult)  Goal: Improved Social/Occupational/Functional Skills (Excessive Substance Use)  Outcome: Ongoing (see interventions/notes)

## 2015-10-17 NOTE — ED Nurses Note (Signed)
Per Animal nutritionistCrisis Worker Jane. Pt to be admitted to [facility].

## 2015-10-17 NOTE — Nurses Notes (Addendum)
Flexeril for back spasm nd  Remeron` for sleep given per pt request.        0700: Slept  Heard snoring through the night , had 9 hours on uninterrupted sleep.

## 2015-10-17 NOTE — Nurses Notes (Signed)
Patient more pleasant and kind. Very appreciative.  His affects is bright. He maintain good eye contact. Dress in Higher education careers adviserpersonal clothing clean and kept. His mood is good.   Denies suicidal, homicidal ideations , visual and auditory  Hallucinations. Pt denies plans or intent. No preparatory behaviors. Denies N/V/D.  No c/o withdrawal symptoms. Will continue to monitor Q 15 min's.

## 2015-10-17 NOTE — Care Plan (Signed)
Problem: [place] General Plan of Care (ADULT)  Goal: Interdisciplinary Rounds/Family Conf  Outcome: Ongoing (see interventions/notes)    10/17/15 1606   Interdisciplinary Rounds/Family Conf   Participants social work;patient      Met with pt to mutually discuss admission, tx goals and plan of care. Biopsychosocial completed, Safety plan to reduce suicide risk initiated and will be reviewed prior to d/c. Pt encouraged to participate in all of the activities on the milieu  Goal: Individualization & Mutuality  Outcome: Ongoing (see interventions/notes)    10/17/15 1606   Personal Strengths/Vulnerabilities   Patient Personal Strengths family/social support;independent living skills;interests/hobbies;motivated for treatment;motivated for recovery;positive vocational history;resourceful   Patient Vulnerabilities Frequent relapse;Poor coping skills         Problem: Overarching Goals (Adult)  Goal: Adheres to Safety Considerations for Self and Others  Intervention: Develop/Maintain Individualized Safety Plan    10/17/15 1606   Develop/Maintain Individualized Safety Plan   Safety Measures safety plan mutually developed      Safety plan to reduce suicide risk initiated and will be reviewed prior to d/c    Goal: Develops/Participates in Therapeutic Alliance to Support Successful Transition  Intervention: [place]    10/17/15 [place] Relationship/Rapport care explained;choices provided;emotional support provided;empathic listening provided;questions answered;questions encouraged;reassurance provided;thoughts/feelings acknowledged         Met with pt to mutually discuss admission, tx goals and plan of care. Biopsychosocial completed, Safety plan to reduce suicide risk initiated and will be reviewed prior to d/c. Pt encouraged to participate in all of the activities on the milieu  Intervention: Mutually Develop Transition Plan    10/17/15 1606   Mutually Develop Transition Plan      Transition Support community resources reviewed;follow-up care discussed      Met with pt to review resources for outpt f/u. Pt requests referral be sent to [facility] for Psychiatry, IOP and CES. Referral faxed and appointment will be scheduled prior to d/c. Pt also provided with referral for [facility] in [place]      Problem: Suicidal Behavior (Adult)  Goal: Suicidal Behavior is Absent/Minimized/Managed  Outcome: Ongoing (see interventions/notes)    10/17/15 1606   OTHER   Action Step/Short Term Goal (STG) Established 10/17/15   Suicidal Behavior is Absent/Minimized/Managed   Suicidal Behavior Managed/Minimized Time Frame for Action Step (STG) 3 days   Suicidal Behavior Managed/Minimized Action Step (STG) Outcome making progress toward outcome      Pt reports suicidal ideations with no specific plan or intent.      GOAL: Pt will be free of all suicidal thoughts, plans or intent for 48 hours    Problem: Impaired Control (Excessive Substance Use) (Adult)  Goal: Participates in Recovery Program (Excessive Substance Use)  Outcome: Ongoing (see interventions/notes)    10/17/15 1606   Participates in Recovery Program (Excessive Substance Use)   Participates in Recovery Program Action Step/Short Term Goal (STG) Established 10/17/15   Participates in Recovery Program Time Frame for Action Step (STG) other (see comments)  (5 days)   Participates in Recovery Program Action Step (STG) Outcome making progress toward outcome      Pt reports chronic alcohol and cocaine use with no significant period of abstinence.     GOAL: Pt will identify three triggers for his substance use and 2 coping skills. Pt will participate in the AA/NA meetings while on the milieu.    Problem: Social/Occupational/Functional Impairment (Excessive Substance Use) (Adult)  Goal: Improved Social/Occupational/Functional Skills (  Excessive Substance Use)  Outcome: Ongoing (see interventions/notes)    10/17/15 1606   Improved Social/Occupational/Functional  Skills (Excessive Substance Use)   Improved Social/Occupational/Functional Skills Action Step/Short Term Goal (STG) Established 10/17/15   Improved Social/Occupational/Functional Skills Time Frame for Action Step (STG) other (see comments)  (7 days)      Pt reports that his alcohol and drug use has negatively impacted his relationships, his legal status and his employment opportunities.     GOAL: Pt will explore his relationships issues and how is substance use has negatively impacted them. Pt will learn to express his feelings in a healthy way and will work on establishing healthy boundaries.

## 2015-10-17 NOTE — ED Nurses Note (Addendum)
[facility], pt to be transported with Careers information officerJane Crisis Worker.

## 2015-10-17 NOTE — ED Provider Notes (Signed)
[name], [name], MD  [facility]  Emergency Department Visit Note    Date:  10/17/2015  Primary care provider:  No Established Pcp  Means of arrival:  ambulance  History obtained from: patient and nursing home  History limited by: none    Chief Complaint:  Suicidal Ideation    HISTORY OF PRESENT ILLNESS     [name], date of birth 05/31/1980, is a 35 y.o. male who presents to the Emergency Department due to suicidal ideations. The patient reports that he was just discharged today from behavioral health services. He states that he was "relaxing at home" and felt like he was going to abuse drugs or hurt himself so he felt he needed to return for further inpatientreportedlyt. The patient reportdly has a history of drug abuse. He affirms drinking tonight ("a couple beers").     REVIEW OF SYSTEMS     The pertinent positive and negative symptoms are as per HPI. All other systems reviewed and are negative.     PATIENT HISTORY     Past Medical History:  Past Medical History:   Diagnosis Date    Anxiety     Arthropathy     Depression     Hx MRSA infection      Past Surgical History:  Past Surgical History:   Procedure Laterality Date    Hx cholecystectomy       Family History:  No history of acute family illness given at this time.     Social History:  Social History   Substance Use Topics    Smoking status: Current Every Day Smoker     Packs/day: 1.00     Types: Cigarettes    Smokeless tobacco: Not on file    Alcohol use Yes      Comment: "Couple times a week"  "Enough to get drunk"     History   Drug Use    Yes    Special: Heroin, Cocaine, Marijuana     Comment: "Adderal, Crack"     Medications:  Current Outpatient Prescriptions   Medication Sig    mirtazapine (REMERON) 7.5 mg Oral Tablet Take 1 Tab (7.5 mg total) by mouth Nightly as needed, may repeat one time    nicotine (NICODERM CQ) 21 mg/24 hr Transdermal Patch 24 hr 1 Patch (21 mg total) by Transdermal route Once a day    venlafaxine  (EFFEXOR XR) 75 mg Oral Capsule, Sust. Release 24 hr Take 1 Cap (75 mg total) by mouth Every 24 hours     Allergies:  No Known Allergies    PHYSICAL EXAM     Vitals:  Filed Vitals:    10/17/15 0303   BP: (!) 143/88   Pulse: 88   Resp: (!) 22   Temp: 36.7 C (98 F)   SpO2: 100%     Pulse ox  100% on None (Room Air) interpreted by me as: Normal    Constitutional: The patient is alert and oriented to person, place, and time. Well-developed and well-nourished.  HENT: Atraumatic, normocephalic head. Mucous membranes moist. TM's clear, Nares unremarkable. Oropharynx shows no erythema or exudate.   Eyes: Pupils equal and round, reactive to light. No scleral icterus. Normal conjunctiva. Extraocular movements are intact.  Neck: Supple, non-tender, no nuchal rigidity, no adenopathy.   Lungs: Clear to auscultation bilaterally. Symmetric and equal expansion. No respiratory distress or retractions.  Cardiovascular: Heart is S1-S2 regular rate and regular rhythm without murmur click or rub.  Abdomen:  Soft, non-distended. No tenderness to palpation without evidence of rebound or guarding. No pulsatile masses. No organomegaly.   Genitourinary: No CVA tenderness.  Extremities: Full range of motion, no clubbing, cyanosis, or edema. Pulses 2+, capillary refill <2 seconds.  Spine: No midline or paraspinal muscle tenderness to palpation. No step-off.   Skin: Warm and dry. No cyanosis, jaundice, rash or lesion.  Neurologic: Alert and oriented x3. Normal facial symmetry and speech, Normal upper and lower extremity strength, and grossly normal sensation.     DIFFERENTIALS:     1. Depression  2. Suicidal ideation  3. Polysubstance abuse  4. Medical noncompliance    DIAGNOSTIC STUDIES     Labs:    Results for orders placed or performed during the hospital encounter of 10/17/15   COMPREHENSIVE METABOLIC PROFILE - [facility] ONLY   Result Value Ref Range    SODIUM 137 136 - 145 mmol/L    POTASSIUM 4.2 3.4 - 5.1 mmol/L    CHLORIDE 102 101 - 111  mmol/L    CO2 TOTAL 25 22 - 32 mmol/L    ANION GAP 10 3 - 11 mmol/L    BUN 10 6 - 20 mg/dL    CREATININE 5.78 4.69 - 1.24 mg/dL    BUN/CREA RATIO 12 6 - 22    ESTIMATED GFR >60 >60 mL/min/1.9m2    ALBUMIN 4.4 3.5 - 5.0 g/dL    CALCIUM 62.9 8.6 - 52.8 mg/dL    GLUCOSE 413 70 - 244 mg/dL    ALKALINE PHOSPHATASE 67 38 - 126 U/L    ALT (SGPT) 55 17 - 63 U/L    AST (SGOT) 34 15 - 41 U/L    BILIRUBIN TOTAL 0.5 0.3 - 1.2 mg/dL    PROTEIN TOTAL 6.8 6.4 - 8.3 g/dL    ALBUMIN/GLOBULIN RATIO 1.8 0.8 - 2.0   DRUG SCREEN,URINE,RAPID - [facility] ONLY   Result Value Ref Range    AMPHETAMINES URINE Negative Negative    BARBITURATES URINE Negative Negative    BENZODIAZEPINES URINE Negative Negative    PCP URINE Negative Negative    METHADONE URINE Negative Negative    OPIATES URINE Negative Negative    CANNABINOIDS URINE Negative Negative    COCAINE METABOLITES URINE Positive (A) Negative    OXYCODONE URINE Negative Negative    TRICYCLIC ANTIDEPRESSANTS URINE Negative Negative   ETHANOL, SERUM   Result Value Ref Range    ETHANOL <10 <10 mg/dL    ETHANOL Not Detected    URINALYSIS WITH MICROSCOPIC REFLEX IF INDICATED [facility] ONLY   Result Value Ref Range    COLOR Straw Light Yellow, Straw, Yellow    APPEARANCE Clear Clear    PH 6.0 <8.0    LEUKOCYTES Negative Negative WBCs/uL    NITRITE Negative Negative    PROTEIN Negative Negative mg/dL    GLUCOSE Negative Negative mg/dL    KETONES Negative Negative mg/dL    UROBILINOGEN < 2.0 <=0.1 mg/dL    BILIRUBIN Negative Negative mg/dL    BLOOD Negative Negative mg/dL    SPECIFIC GRAVITY 0.272 <1.022   CBC WITH DIFF   Result Value Ref Range    WBC 11.6 (H) 4.0 - 11.0 x103/uL    RBC 5.23 4.40 - 5.80 x106/uL    HGB 15.6 13.5 - 18.0 g/dL    HCT 53.6 64.4 - 03.4 %    MCV 90.3 82.0 - 97.0 fL    MCH 29.9 27.5 - 33.2 pg    MCHC 33.1 32.0 -  36.0 g/dL    RDW 16.113.5 09.611.0 - 04.516.0 %    PLATELETS 226 150 - 450 x103/uL    MPV 9.6 7.4 - 10.5 fL    NEUTROPHIL % 82 (H) 43 - 76 %    LYMPHOCYTE % 13 (L) 15 - 43 %      MONOCYTE % 4 (L) 5 - 12 %    EOSINOPHIL % 0 0 - 5 %    BASOPHIL % 1 0 - 3 %    NEUTROPHIL # 9.50 (H) 1.50 - 6.50 x103/uL    LYMPHOCYTE # 1.50 1.00 - 4.80 x103/uL    MONOCYTE # 0.50 0.20 - 0.90 x103/uL    EOSINOPHIL # 0.00 0.00 - 0.50 x103/uL    BASOPHIL # 0.10 0.00 - 0.10 x103/uL     Labs reviewed and interpreted by me.    ED PROGRESS NOTE / MEDICAL DECISION MAKING     Old records reviewed by me:  I have reviewed the nurse's notes. I have reviewed the patient's problem list.     Orders Placed This Encounter    COMPREHENSIVE METABOLIC PROFILE - [facility] ONLY    DRUG SCREEN,URINE,RAPID - [facility] ONLY    ETHANOL, SERUM    URINALYSIS WITH MICROSCOPIC REFLEX IF INDICATED [facility] ONLY    CBC WITH DIFF     2:39 AM: Initial evaluation is complete at this time. I will order a CBC, CMP, drug screen, ethanol, and UA I discussed with the patient that I would page the crisis counselor to consult with him about his case to further evaluate. I will reevaluate to check the patient's progress after treatment. Patient is agreeable with the treatment plan at this time.    4:42 AM: As per the crisis worker, after full evaluation, the patient has been admitted by Dr. Dione Housekeeperrexler (Psychiatrist).     Pre-Disposition Vitals:  Filed Vitals:    10/17/15 0303   BP: (!) 143/88   Pulse: 88   Resp: (!) 22   Temp: 36.7 C (98 F)   SpO2: 100%     1. I have screened the patient for tobacco use and the patient is a tobacco user.  I have counseled the patient for less than 3 minutes to quit using tobacco products due to their multiple adverse health effects.   2. Pre-hypertension/Hypertension: The patient has been informed that they may have pre-hypertension or hypertension based on an elevated blood pressure reading in the ED.  I recommend that the patient call the provider listed on their discharge instructions or a physician of their choice to arrange follow up for further evaluation of possible pre-hypertension or hypertension.     CLINICAL  IMPRESSION     Encounter Diagnoses   Name Primary?    Substance abuse Yes    Suicidal ideations      DISPOSITION/PLAN     Admitted       Condition at Disposition: Fair    SCRIBE ATTESTATION STATEMENT  I Lonzo Cloudourtney Sansone, SCRIBE scribed for [name], Chae Shuster S, MD on 10/17/2015 at 2:37 AM.     Documentation assistance provided for [name], Ansar Skoda S, MD  by Lonzo Cloudourtney Sansone, SCRIBE. Information recorded by the scribe was done at my direction and has been reviewed and validated by me [name], Azhia Siefken S, MD.

## 2015-10-17 NOTE — ED Nurses Note (Signed)
Pt's mother calls in to inquire about pt's condition. Received permission from pt to speak to his mother. Updated mother on condition, answered questions. Mother states she is 5 1/2 hrs away and is disabled so she is unable to come to hospital. Asks that I relay message to pt that he is to call her when he can. Message relayed to pt.

## 2015-10-17 NOTE — ED Nurses Note (Signed)
Stressed to pt the need for UDS, states that he will not be able to provide one until he is given soda. Coke a cola given to pt at this time. Denies pain or needs. Will continue to monitor.

## 2015-10-17 NOTE — H&P (Signed)
[facility]    Psychiatric Admission H&P    [name],[name], 35 y.o. male  Date of Admission:  10/17/2015  Date of Birth:  [birth year]  PCP:  No Established Pcp    Information Obtained from: patient and history reviewed via medical record  ID: This patient is 35 year old single caucasian male who is homeless  Chief Complaint:  Suicidal ideation with a plan  HPI/Discussion: This patient was discharged yesterday from psychiatric unit. He used crack cocaine and alcohol and became suicidal again and ended up in ED. He was disrespectful at unit and was in arguments with staff and peers. He reports he continues to be suicidal without a plan. He used 2 grams of cocaine and 3-4 beers yesterday. He reports he is having headache, crawling on skin and palpitations as withdrawal. He reports homicidal ideations towards whoever bothers him. No hallucinations reported. He reports paranoia that people are talking about him and are about to get him. His sleep is not good. His appetite is fine.   He had been using alcohol since age 65, cocaine since 40, cannabis since 44 and other substances on and off. He reports he had tried almost everything. He reports his first drug of choice is cocaibe, second one is alcohol and third one is Marijuama. On his last admission patient reported withdrawal symptoms but was not scoring on CIWA, showed drug seeking behavior. He was observed sleeping in his room while complaining about withdrawal.         PPHx:   Current outpatient psych treatment:  No outpatient psychiatric treatment  Past outpatient psych treatment: never had been in outpatient rehab treatment treatment  Inpatient treatment: 6-7 hospitalizations mostly drug related  Medication trials: Wellbutrin, Effexor XR  Suicide attempts:  Once by od on drugs  Substance abuse:    drug use since age 35, mostly cocaine                          - alcohol use heavily in the past, now bout 3-4 times per week - had some period of reduction,  states now building back                          - stimulants, cannabis intermittently                          - has a h/o multiple stints in rehab and detox  @[facility] @  Past Medical History:   Diagnosis Date    Anxiety     Arthropathy     Depression     Hx MRSA infection          Past Surgical History:   Procedure Laterality Date    HX CHOLECYSTECTOMY           Medications Prior to Admission     Prescriptions    mirtazapine (REMERON) 7.5 mg Oral Tablet    Take 1 Tab (7.5 mg total) by mouth Nightly as needed, may repeat one time    nicotine (NICODERM CQ) 21 mg/24 hr Transdermal Patch 24 hr    1 Patch (21 mg total) by Transdermal route Once a day    venlafaxine (EFFEXOR XR) 75 mg Oral Capsule, Sust. Release 24 hr    Take 1 Cap (75 mg total) by mouth Every 24 hours          Current Facility-Administered Medications:  acetaminophen (TYLENOL) tablet 650 mg Oral Q4H PRN   aluminum-magnesium hydroxide-simethicone (MAG-AL PLUS) 200-200-20 mg per 5 mL oral liquid 30 mL Oral Q4H PRN   diphenhydrAMINE (BENADRYL) capsule 50 mg Oral Q6H PRN   Or      diphenhydrAMINE (BENADRYL) 50 mg/mL injection 50 mg Intramuscular Q6H PRN   hydrOXYzine pamoate (VISTARIL) capsule 25 mg Oral Q8H PRN   magnesium hydroxide (MILK OF MAGNESIA) 400mg  per 5mL oral liquid 30 mL Oral HS PRN   mirtazapine (REMERON) tablet 7.5 mg Oral HS PRN - MR x 1   nicotine (NICODERM CQ) transdermal patch (mg/24 hr) 21 mg Transdermal Daily   nicotine polacrilex (NICORETTE) chewing gum 2 mg Oral Q1H PRN   nicotine polacrilex (NICORETTE) chewing gum 4 mg Oral Q1H PRN   OLANZapine (ZYPREXA ZYDIS) rapid dissolve tablet 5 mg Oral Q8H PRN   Or      OLANZapine (ZYPREXA INTRAMUSCULAR) IM injection 5 mg Intramuscular Q6H PRN     No Known Allergies  Social History   Substance Use Topics    Smoking status: Current Every Day Smoker     Packs/day: 1.00     Types: Cigarettes    Smokeless tobacco: Not on file    Alcohol use Yes      Comment: "Couple times a week"   "Enough to get drunk"           Social History:  -Support system: Lives alone, homeless - recently lost job and employer provided housing; He has no children and no prior marriages.  -Education: High school graduate  -Employment: None; was working putting in [employer]; recently unemployed    -Trauma/Abuse: denies    Family history: reviewed  H/O depression and substance abuse in family. Mother is addicted to opiates.No history of suicide attempt in family. H/O Diabetes in family.     ROS: Other than ROS in the HPI, all other review of systems were negative except for: backache    Exam:  Temperature: 36.7 C (98.1 F)  Heart Rate: 86  BP (Non-Invasive): 138/78  Respiratory Rate: 16  SpO2-1: 99 %  Pain Score (Numeric, Faces): 8  Patient was medically cleared by ED physician to get admitted in psychiatric unit.     MSE:   Appearance:  hospital gown   Behavior:  cooperative and eye contact  Fair   Motor/MS:  normal gait   Speech:  normal   Memory:  grossly normal   Mood:  sad and anxious   Affect:  full range   Perception:  tactile hallucinations   Thought:  goal directed/coherent   Thought Content:  suicidal and delusional    Suicidal Ideation:  ideations--no plan.     Homicidal Ideation:  none   Level of Consciousness:  alert   Orientation:  oriented to person, oriented to place and oriented to time   Judgement:  poor   Conceptual:  abstract thinking   Fund of Knowledge:  Average   Insight; poor    Labs:    Lab Results for Last 24 Hours:    Results for orders placed or performed during the hospital encounter of 10/17/15 (from the past 24 hour(s))   COMPREHENSIVE METABOLIC PROFILE - [facility] ONLY   Result Value Ref Range    SODIUM 137 136 - 145 mmol/L    POTASSIUM 4.2 3.4 - 5.1 mmol/L    CHLORIDE 102 101 - 111 mmol/L    CO2 TOTAL 25 22 - 32 mmol/L    ANION GAP  10 3 - 11 mmol/L    BUN 10 6 - 20 mg/dL    CREATININE 1.61 0.96 - 1.24 mg/dL    BUN/CREA RATIO 12 6 - 22    ESTIMATED GFR >60 >60  mL/min/1.53m2    ALBUMIN 4.4 3.5 - 5.0 g/dL    CALCIUM 04.5 8.6 - 40.9 mg/dL    GLUCOSE 811 70 - 914 mg/dL    ALKALINE PHOSPHATASE 67 38 - 126 U/L    ALT (SGPT) 55 17 - 63 U/L    AST (SGOT) 34 15 - 41 U/L    BILIRUBIN TOTAL 0.5 0.3 - 1.2 mg/dL    PROTEIN TOTAL 6.8 6.4 - 8.3 g/dL    ALBUMIN/GLOBULIN RATIO 1.8 0.8 - 2.0   ETHANOL, SERUM   Result Value Ref Range    ETHANOL <10 <10 mg/dL    ETHANOL Not Detected    CBC WITH DIFF   Result Value Ref Range    WBC 11.6 (H) 4.0 - 11.0 x103/uL    RBC 5.23 4.40 - 5.80 x106/uL    HGB 15.6 13.5 - 18.0 g/dL    HCT 78.2 95.6 - 21.3 %    MCV 90.3 82.0 - 97.0 fL    MCH 29.9 27.5 - 33.2 pg    MCHC 33.1 32.0 - 36.0 g/dL    RDW 08.6 57.8 - 46.9 %    PLATELETS 226 150 - 450 x103/uL    MPV 9.6 7.4 - 10.5 fL    NEUTROPHIL % 82 (H) 43 - 76 %    LYMPHOCYTE % 13 (L) 15 - 43 %    MONOCYTE % 4 (L) 5 - 12 %    EOSINOPHIL % 0 0 - 5 %    BASOPHIL % 1 0 - 3 %    NEUTROPHIL # 9.50 (H) 1.50 - 6.50 x103/uL    LYMPHOCYTE # 1.50 1.00 - 4.80 x103/uL    MONOCYTE # 0.50 0.20 - 0.90 x103/uL    EOSINOPHIL # 0.00 0.00 - 0.50 x103/uL    BASOPHIL # 0.10 0.00 - 0.10 x103/uL   URINALYSIS WITH MICROSCOPIC REFLEX IF INDICATED [facility] ONLY   Result Value Ref Range    COLOR Straw Light Yellow, Straw, Yellow    APPEARANCE Clear Clear    PH 6.0 <8.0    LEUKOCYTES Negative Negative WBCs/uL    NITRITE Negative Negative    PROTEIN Negative Negative mg/dL    GLUCOSE Negative Negative mg/dL    KETONES Negative Negative mg/dL    UROBILINOGEN < 2.0 <=6.2 mg/dL    BILIRUBIN Negative Negative mg/dL    BLOOD Negative Negative mg/dL    SPECIFIC GRAVITY 9.528 <1.022   DRUG SCREEN,URINE,RAPID - [facility] ONLY   Result Value Ref Range    AMPHETAMINES URINE Negative Negative    BARBITURATES URINE Negative Negative    BENZODIAZEPINES URINE Negative Negative    PCP URINE Negative Negative    METHADONE URINE Negative Negative    OPIATES URINE Negative Negative    CANNABINOIDS URINE Negative Negative    COCAINE METABOLITES URINE  Positive (A) Negative    OXYCODONE URINE Negative Negative    TRICYCLIC ANTIDEPRESSANTS URINE Negative Negative     ASSESSMENT    I - substance induced depressive d/o; cocaine use d/o, severe dependence; alcohol use d/o, moderate; stimulant use d/o, mild; cannabis use d/o, mild, r/o malingering  II - deferred  III - none  IV - homeless, unemployed, single    Plan:  1. Disposition: Patient will  be admitted to inpatient psychiatry for acute stabilization of suicidal ideation and detox. Will likely be discharged in 3-4 day or when there is an absence of suicidal thoughts x48hours.   2. Legal status: voluntary. Patient has [facility].   3. Meds - Restart Effexor XR 75mg  po qd and Vistaril 25 mg qid prn for depression and anxiety. Will monitor for withdrawal symptoms if any then will start prn meds for alcohol withdrawal. No alcohol detected in recent labs.   4. Psychoeducation - Risks and benefits of medications discussed and patient verbalized understanding.   5. Medical comorbidities - backache  6. Labs/work-up - none  7. Social issues - homeless  8. Precautions -routine  9. Therapy - encouraged to attend all groups; provide milieu therapy and brief individual therapy as appropriate.   Rachelle Horaayyab Kaylee Trivett, MD

## 2015-10-18 MED ADMIN — heparin (porcine) 5,000 unit/mL injection solution: ORAL | @ 18:00:00

## 2015-10-18 NOTE — Nurses Notes (Addendum)
Pt approach writer, I need all my medication vistaril and flexeril and all the ril"s because i did not sleep all night. However pt slept thought the night without awaking  Heard snoring form  Nursing stations. Staff has difficulty arousing  to obtain his vital signs  Please safety  Safety checks sheet . Vistaril and flexeril  given per pt request. Will have oncoming shift monitor effectiveness.

## 2015-10-18 NOTE — Behavioral Health (Signed)
Safety plan to reduce suicide risk completed and copy placed in the chart.

## 2015-10-18 NOTE — Behavioral Health (Signed)
Type of group:  Psychoeducational Theme-Choices and Results  Group technique:  Handout and Discussion "GREAT [facility]REAM" Ten key to happier living  Time group initiated:  1045  Length of group time:  55 Minutes  Patient's mental status/affect:  Engaged, motivated  Patient's behavior:  Pt shared actively and made good eye contact.  Patient's response:  Pt shared that he feels that happiness can be a choice and that attitude is important.  Pt shared that he likes to be there for others and help them which in turn helps him.

## 2015-10-18 NOTE — Care Plan (Signed)
Problem: Patient Care Overview (Adult,OB)  Goal: Plan of Care Review(Adult,OB)  The patient and/or their representative will communicate an understanding of their plan of care   Outcome: Ongoing (see interventions/notes)  Goal: Individualization/Patient Specific Goal(Adult/OB)  Outcome: Ongoing (see interventions/notes)  Goal: Interdisciplinary Rounds/Family Conf  Outcome: Ongoing (see interventions/notes)    Problem: BH General Plan of Care (ADULT)  Goal: Plan of Care Review  Outcome: Ongoing (see interventions/notes)  Goal: Interdisciplinary Rounds/Family Conf  Outcome: Ongoing (see interventions/notes)  Goal: Individualization & Mutuality  Outcome: Ongoing (see interventions/notes)    Problem: Overarching Goals (Adult)  Goal: Adheres to Safety Considerations for Self and Others  Outcome: Ongoing (see interventions/notes)  Goal: Optimized Coping Skills in Response to Life Stressors  Outcome: Ongoing (see interventions/notes)  Goal: Develops/Participates in Therapeutic Alliance to Support Successful Transition  Outcome: Ongoing (see interventions/notes)    Problem: Suicidal Behavior (Adult)  Goal: Suicidal Behavior is Absent/Minimized/Managed  Outcome: Ongoing (see interventions/notes)    Problem: Impaired Control (Excessive Substance Use) (Adult)  Goal: Participates in Recovery Program (Excessive Substance Use)  Outcome: Ongoing (see interventions/notes)    Problem: Social/Occupational/Functional Impairment (Excessive Substance Use) (Adult)  Goal: Improved Social/Occupational/Functional Skills (Excessive Substance Use)  Outcome: Ongoing (see interventions/notes)

## 2015-10-18 NOTE — Nurses Notes (Signed)
Pt has been pleasant, calm and cooperative with bright affect, interacting well with staff and peers. Pt attended all groups and meals and all prn medications were effective.  Will continue to monitor Q 15 mins.

## 2015-10-18 NOTE — Nurses Notes (Signed)
Patient requested a medication for insomnia. Medicated with Remeron 7.5 PO prn per order. Will assess patient for medicine efficacy.

## 2015-10-18 NOTE — Behavioral Health (Signed)
Assisted pt in calling SA facilities in [place]. Pt able to get accepted [facility] [place] [place] [phone]). Facility requests H & P and labs. Faxed to facility

## 2015-10-18 NOTE — Nurses Notes (Signed)
Type of group: Wrap Up  Group technique: Interactive discussion--Educated regarding goals, coping skills, smoking cessation, and safety with regard to falls.  Time group initiated: 2030  Group end time: 2100  Patient's mental status/affect: Pleasant, attentive, and anxious. Flat affect.  Patient's behavior: Cooperative and participative. Talkative. Engaged.   Patient's response: Patient's goal for the day was "review after-care plan." Patient verbalized goal as being met, states he "is close to being set up with everything after-care related and I had a good day." Relates a coping skill that works is "deep breaths; just stop and be quiet."

## 2015-10-18 NOTE — Behavioral Health (Signed)
Pt reports that he was able to speak to his sponsor last night and would like to return to [place]. Pt states that he would like to call several [place] facilities ([facility] [place].

## 2015-10-18 NOTE — Progress Notes (Signed)
[facility]  Psychiatric Inpatient Progress Note  [name] SinclairJoseph [name]  [id number]  07/28/1980  Date of Service: 10/18/2015    CC: suicidal thoughts    Interval Hx: Per reports, patient has been more cooperative. He has been seeking prn medications frequently. Writer inquired as to why he did not go to the [facility] after his discharge last admission. He states he was supposed to stay with a friend for a night before going to the [facility] but did not last even a day without using cocaine. He insists getting put on medications to prevent future relapses stating "this is not normal." He reports having fears that he would end up harming himself if discharged. He reports feeling depressed about the state of his life at this time - having no home, no job, no social support and particularly his difficulty maintaining sobriety.      Of note, he states he did not refuse signing up for medicaid but that he didn't think he would qualify because he could not show proof of residence.     Objective:  Filed Vitals:    10/17/15 1806 10/18/15 0600 10/18/15 1000 10/18/15 1400   BP: 120/77 116/70 135/79 126/66   Pulse: 87 69 69 84   Resp: 18 16 18 16    Temp: 37 C (98.6 F) 36.4 C (97.6 F) 36.7 C (98.1 F) 36.7 C (98.1 F)   SpO2: 96% 100%       Mental Status Exam [Per above +]  Patient appears to have fair grooming, good hygiene. Well engaged. No abnormal involuntary movements noted. Speech wnl. Mood depressed; affect is fair. Thought process is linear. No delusions elicited. [name] LongsJoseph currently denies any suicidal or homicidal ideations, plans, or intent and appears to be without auditory or visual hallucinations. Insight and judgement fair. Well oriented.    Medications:    Current Facility-Administered Medications:  acetaminophen (TYLENOL) tablet 650 mg Oral Q4H PRN   aluminum-magnesium hydroxide-simethicone (MAG-AL PLUS) 200-200-20 mg per 5 mL oral liquid 30 mL Oral Q4H PRN   cyclobenzaprine (FLEXERIL) tablet 10  mg Oral 2x/day PRN   diphenhydrAMINE (BENADRYL) capsule 50 mg Oral Q6H PRN   Or      diphenhydrAMINE (BENADRYL) 50 mg/mL injection 50 mg Intramuscular Q6H PRN   hydrOXYzine pamoate (VISTARIL) capsule 25 mg Oral Q8H PRN   magnesium hydroxide (MILK OF MAGNESIA) 400mg  per 5mL oral liquid 30 mL Oral HS PRN   mirtazapine (REMERON) tablet 7.5 mg Oral HS PRN - MR x 1   nicotine (NICODERM CQ) transdermal patch (mg/24 hr) 21 mg Transdermal Daily   nicotine polacrilex (NICORETTE) chewing gum 4 mg Oral Q1H PRN   OLANZapine (ZYPREXA ZYDIS) rapid dissolve tablet 5 mg Oral Q8H PRN   Or      OLANZapine (ZYPREXA INTRAMUSCULAR) IM injection 5 mg Intramuscular Q6H PRN   venlafaxine (EFFEXOR XR) 24 hr extended release capsule 75 mg Oral Q24H       Labs: none    ASSESSMENT    I - substance induced depressive d/o; cocaine use d/o, severe dependence; alcohol use d/o, moderate; stimulant use d/o, mild; cannabis use d/o, mild, r/o malingering  II - deferred  III - none  IV - homeless, unemployed, single    Progress: no active suicidal thoughts    Plan:  1. Disposition: Patient likely be discharged tomorrow   2. Legal status: voluntary. [facility]   3. Meds - continue Effexor XR 75mg  po qd and Vistaril 25 mg  qid prn for depression and anxiety.   4. Psychoeducation - Risks and benefits of medications discussed and patient verbalized understanding.   5. Medical comorbidities - backache; continue flexeril  6. Labs/work-up - none  7. Social issues - homeless; [facility] 10/23/15 early morning  8. Precautions -routine  9. Therapy - encouraged to attend all groups; provide milieu therapy and brief individual therapy as appropriate.     Marcy PanningHelen Christana Angelica, MD 10/18/2015, 17:06

## 2015-10-18 NOTE — Nurses Notes (Signed)
Type of group:  Wrap Up  Group technique:  Interactive discussion--Educated regarding goals and coping skills.  Time group initiated:  2030  Length of group time:  30  Patient's mental status/affect:  frustrated .  Patient's behavior:  Cooperative and participative.  Patient's response:  Patient's goal for the day was "to calm and positive ." He verbalized his goal as being met. Relates a coping skill that works for him is "can't worry about  Others , walk away". frustrated to person on the phone and  , nobody on the unit.

## 2015-10-18 NOTE — Behavioral Health (Signed)
Type of group: Process  Group technique: Utilized Motivational Interviewing strategies to identify ambiguity in the change process  Time group initiated: 14:30  Length of group time: 45 minutes  Patient's mental status/affect: Pt alert, oriented. Affect: WNL  Patient's behavior: Pt calm, cooperative and moderately participated in group  Patient's response: Pt shared that he struggles with reconnecting with his former support group as he feels judged by them but at the same time recognizes that he needs the support from sober friends

## 2015-10-18 NOTE — Nurses Notes (Signed)
PRN  Pt requested/received Nicorette gum, Vistaril and Flexeril.

## 2015-10-19 LAB — ECG 12-LEAD
Atrial Rate: 86 {beats}/min
Calculated P Axis: 52 degrees
QRS Duration: 90 ms
QTC Calculation: 411 ms

## 2015-10-19 MED ADMIN — hydrOXYzine pamoate 25 mg capsule: ORAL | @ 07:00:00

## 2015-10-19 NOTE — Nurses Notes (Signed)
Pt has been up and ambulating throughout the day. Pt has flat affect, appropriate eye contact. Interaction with staff and peers is appropriate. Attending and participating in groups. Denies all suicidal and homicidal ideations and all hallucinations. Denies nausea, vomiting, and diarrhea. Complaint of 6/10 back pain. States mood as "depressed". States he didn't sleep, tossed and turned, and that the "meds aren't helping me sleep". Will continue to monitor Q 15 minutes for safety.

## 2015-10-19 NOTE — Care Plan (Signed)
Problem: Patient Care Overview (Adult,OB)  Goal: Plan of Care Review(Adult,OB)  The patient and/or their representative will communicate an understanding of their plan of care   Outcome: Ongoing (see interventions/notes)    Goal: Individualization/Patient Specific Goal(Adult/OB)  Outcome: Ongoing (see interventions/notes)    Goal: Interdisciplinary Rounds/Family Conf  Outcome: Ongoing (see interventions/notes)      Problem: BH General Plan of Care (ADULT)  Goal: Plan of Care Review  Outcome: Ongoing (see interventions/notes)    Goal: Interdisciplinary Rounds/Family Conf  Outcome: Ongoing (see interventions/notes)    Goal: Individualization & Mutuality  Outcome: Ongoing (see interventions/notes)      Problem: Overarching Goals (Adult)  Goal: Adheres to Safety Considerations for Self and Others  Outcome: Ongoing (see interventions/notes)    Goal: Optimized Coping Skills in Response to Life Stressors  Outcome: Ongoing (see interventions/notes)    Goal: Develops/Participates in Therapeutic Alliance to Support Successful Transition  Outcome: Ongoing (see interventions/notes)      Problem: Suicidal Behavior (Adult)  Goal: Suicidal Behavior is Absent/Minimized/Managed  Outcome: Ongoing (see interventions/notes)

## 2015-10-19 NOTE — Nurses Notes (Signed)
10/19/15 1700   Group Therapy Session   Group Attendance attended group session   Time Session Began 1535   Time Session Ended 1615   Total Time (minutes) 40   Group Type expressive therapy   Group Topic Covered emotions/expression   Group Session Detail Discussed our behavior and how it effects others and ourselves.    Patient Participation/Contribution cooperative with task;discussed personal experience with topic;listened actively   Patient Participation Detail Patient was off-topic and disruptive but once redirected, actively participated and contributed to the group.

## 2015-10-19 NOTE — Behavioral Health (Signed)
Type of group: Process  Group technique: Utilized CBT and person-centered strategies to identify cor beliefs and process how these beliefs have affected one's choices  Time group initiated: 14:30  Length of group time: 55 minutes  Patient's mental status/affect: Pt alert, oriented. Affect: WNL  Patient's behavior: Pt calm, cooperative and moderately participated in group  Patient's response: Pt shared that one of his core beliefs is that he did not have to live within other's limits and that he didn't have to set his own limits on himself.

## 2015-10-19 NOTE — Nurses Notes (Signed)
Type of group:  Wrap-up group  Group technique:  Discussion  Time group initiated: 8:15PM-8:40PM  Length of group time: 25 minutes  Patient's mental status/affect:  Engaged, spontaneous  Patient's behavior:  Pt shared actively and made good eye contact.  Patient's response:.  Pt states that when he is self-critical that he usually feels it is justified and motivates himself to do better.

## 2015-10-19 NOTE — Nurses Notes (Signed)
Patients mood has been good and his affect flat. He has been social with peers and staff. Patient stated "my day is going good. I am just getting anxious at times because I get ahead of myself in my head thinking about what needs to be done, what I have to do when I leave here. This is my second time coming here. I left and came back because I wasn't ready to leave. I have my aftercare set up now, so I know what I need to do." Patient did participate in wrap-up group and he has been social within the milieu. Patient requested Nicorette gum @ 2057.  The gum is effective with helping his nicotine withdraw symptoms. He did state he would "need my pill for sleep, because I cant sleep here without something." Patient was medicated with Remeron PO prn @ 2224 for insomnia complaints. The medicine has been effective as patient is still asleep @ 0252. Will continue to assess patient with q15 minute visual safety checks.

## 2015-10-19 NOTE — Behavioral Health (Signed)
Type of group:  Psychoeducational Theme-Care and Compassion  Group technique:  Discussion "How self-compassionate are you?" and the impacts of compassion on our wellbeing.  Exercise "I am" Poem.  Time group initiated:  1050  Length of group time:  1 hour  Patient's mental status/affect:  Engaged, spontaneous  Patient's behavior:  Pt shared actively and made good eye contact.  Patient's response:  Pt shared that he tends to use his failures as learning experiences.  Pt states that when he is self-critical that he usually feels it is justified and motivates himself to do better.

## 2015-10-19 NOTE — Nurses Notes (Signed)
Patient stated when he woke up this morning @ 0630 that he did not get any sleep last night. He reports he "couldnt sleep and was up all night tossing and turning. I can't take too much medicine for sleep or else I cant get my day going, but I can't sleep with that medicine I am getting either. It's not working. It may be my new medicine I am taking, the Effexor. I know Wellbutrin keeps me up." Medicine patient is referring to is Remeron po prn for insomnia; which was not effective.

## 2015-10-19 NOTE — Progress Notes (Signed)
[facility]  Psychiatric Inpatient Progress Note  [name] SinclairJoseph [name]  [id number]  05/03/1980  Date of Service: 10/19/2015    CC: suicidal thoughts    Interval Hx: Patient reports improvement - less depressed though feeling quite remorseful about the damage that's been done due to drug use. He feels more confident that he can stay sober on discharge. He denies suicidal thoughts. Has been cooperative with all care.     Objective:  Filed Vitals:    10/18/15 1000 10/18/15 1400 10/18/15 1800 10/19/15 0600   BP: 135/79 126/66 133/82 (!) 114/55   Pulse: 69 84 82 62   Resp: 18 16 16 16    Temp: 36.7 C (98.1 F) 36.7 C (98.1 F) 36.5 C (97.7 F) 36.3 C (97.3 F)   SpO2:   99% 98%     Mental Status Exam [Per above +]  Patient appears to have fair grooming, good hygiene. Well engaged, pleasant. No abnormal involuntary movements noted. Speech wnl. Mood depressed; affect is fair. Thought process is linear. No delusions elicited. [name] currently denies any suicidal or homicidal ideations, plans, or intent and appears to be without auditory or visual hallucinations. Insight and judgement fair. Well oriented.    Medications:    Current Facility-Administered Medications:  acetaminophen (TYLENOL) tablet 650 mg Oral Q4H PRN   aluminum-magnesium hydroxide-simethicone (MAG-AL PLUS) 200-200-20 mg per 5 mL oral liquid 30 mL Oral Q4H PRN   cyclobenzaprine (FLEXERIL) tablet 10 mg Oral 2x/day PRN   diphenhydrAMINE (BENADRYL) capsule 50 mg Oral Q6H PRN   Or      diphenhydrAMINE (BENADRYL) 50 mg/mL injection 50 mg Intramuscular Q6H PRN   hydrOXYzine pamoate (VISTARIL) capsule 25 mg Oral Q8H PRN   magnesium hydroxide (MILK OF MAGNESIA) 400mg  per 5mL oral liquid 30 mL Oral HS PRN   mirtazapine (REMERON) tablet 7.5 mg Oral HS PRN - MR x 1   nicotine (NICODERM CQ) transdermal patch (mg/24 hr) 21 mg Transdermal Daily   nicotine polacrilex (NICORETTE) chewing gum 4 mg Oral Q1H PRN   OLANZapine (ZYPREXA ZYDIS) rapid dissolve tablet 5  mg Oral Q8H PRN   Or      OLANZapine (ZYPREXA INTRAMUSCULAR) IM injection 5 mg Intramuscular Q6H PRN   venlafaxine (EFFEXOR XR) 24 hr extended release capsule 75 mg Oral Q24H       Labs: none    ASSESSMENT    I - substance induced depressive d/o; cocaine use d/o, severe dependence; alcohol use d/o, moderate; stimulant use d/o, mild; cannabis use d/o, mild, r/o malingering  II - deferred  III - none  IV - homeless, unemployed, single    Progress: no passive or active suicidal thoughts today    Plan:  1. Disposition: Patient will be discharged tomorrow - will be buying tickets this evening to [place] [employer] [place]. Leave tomorrow morning - will take cab.   2. Legal status: voluntary. [facility]   3. Meds - continue Effexor XR 75mg  po daily and Vistaril 25 mg qid prn for depression and anxiety.   4. Psychoeducation - Risks and benefits of medications discussed and patient verbalized understanding.   5. Medical comorbidities - backache; continue flexeril  6. Labs/work-up - none  7. Social issues - homeless but will likely stay with his mother temporarily in [place] until [facility] early morning  8. Precautions -routine  9. Therapy - encouraged to attend all groups; provide milieu therapy and brief individual therapy as appropriate.  Marcy PanningHelen Jamorris Ndiaye, MD 10/19/2015, 17:27

## 2015-10-19 NOTE — Nurses Notes (Signed)
Patient denies nausea, vomiting, and diarrhea. Patient denies SI, homicidal or hallucinations. Patient complaining about lower back pains. Rated pain as "5". Patient had a bowel movement this evening. Patient attended wrap-up group and met his goal which was to learn and work on his issues. Patient medication complaint. Patient went to dayroom to watch tv and socialize with other patients. Patient affect is pleasant, calm, and appropriate. Patient looking forward of getting discharged tomorrow. Good eye contact noted. At, 8:41 PM Patient requested Nicorette gum 4 mg for his nicotine withdrawal. Also, at 10:32 PM Patient requested Remeron 7.5 mg for insomnia. Both prn medications were effective. Pct will continue to monitor patient q 15 minutes for safety.

## 2015-10-19 NOTE — Nurses Notes (Signed)
Patient woke up complaining of anxiety and muscle spasms. Medicated him with Flexeril po prn and Visteril po prn per order. Patient also requested Nicorette gum. Medicated patient per order and will reassess him for medicine efficacy.

## 2015-10-20 DIAGNOSIS — G8929 Other chronic pain: Secondary | ICD-10-CM | POA: Diagnosis present

## 2015-10-20 DIAGNOSIS — M545 Low back pain: Secondary | ICD-10-CM | POA: Diagnosis present

## 2015-10-20 MED ORDER — NICOTINE 21 MG/24 HR DAILY TRANSDERMAL PATCH
21.0000 mg | MEDICATED_PATCH | Freq: Every day | TRANSDERMAL | 0 refills | Status: AC
Start: 2015-10-20 — End: ?

## 2015-10-20 MED ORDER — VENLAFAXINE ER 75 MG CAPSULE,EXTENDED RELEASE 24 HR
75.00 mg | ORAL_CAPSULE | ORAL | 0 refills | Status: AC
Start: 2015-10-20 — End: ?

## 2015-10-20 MED ORDER — CYCLOBENZAPRINE 10 MG TABLET
10.00 mg | ORAL_TABLET | Freq: Two times a day (BID) | ORAL | 0 refills | Status: AC | PRN
Start: 2015-10-20 — End: ?

## 2015-10-20 NOTE — Nurses Notes (Signed)
Patient discharged on 10/20/2015 at 0915. Discharge instructions given to patient per order. Patient verbalized understanding. Patient instructed to contact physician with any questions/concerns. Belongings and medications returned to patient. No discharge follow up at this time [place]

## 2015-10-20 NOTE — Nurses Notes (Signed)
PRN Medication    PRN Flexeril 10 mg given for back pain. Will continue to monitor for effectiveness.

## 2015-10-20 NOTE — Nurses Notes (Signed)
Pt up and ambulating on the unit throughout the morning. A&O x 4. Pt denied having any suicidal or homicidal ideations. Denies psychosis. Endorsing chronic lower back pain which he rates as a 5/10. PRN Flexeril given for muscle tension relief. Affect is consistent with mood. Pt calm, cooperative, and pleasant. Medication complaint. Pt discharged this am. Will continue to monitor for safety while he remains on the unit.

## 2015-11-17 ENCOUNTER — Other Ambulatory Visit (INDEPENDENT_AMBULATORY_CARE_PROVIDER_SITE_OTHER): Payer: Self-pay | Admitting: PSYCHIATRY

## 2015-11-21 NOTE — Discharge Summary (Signed)
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      Details Dose    Mirtazapine 7.5 mg Tablet   Last Hospital Dose:  10/15/2015  9:11 PM   Qty:  30 Tab   Refills:  0   Commonly known as:  REMERON    Instructions:  Take 1 Tab (7.5 mg total) by mouth Nightly as needed, may repeat one time    Dose:  7.5 mg       nicotine 21 mg/24 hr Patch 24 hr   Qty:  28 Patch   Refills:  0   Commonly known as:  NICODERM CQ    Instructions:  1 Patch (21 mg total) by Transdermal route Once a day    Dose:  21 mg       venlafaxine 75 mg Capsule, Sust. Release 24 hr   Last Hospital Dose:  10/16/2015  7:58 AM   Qty:  30 Cap   Refills:  0   Commonly known as:  EFFEXOR XR    Instructions:  Take 1 Cap (75 mg total) by mouth Every 24 hours    Dose:  75 mg            DISCHARGE INSTRUCTIONS: The patient will be discharged under care and supervision of self. They have been instructed to take medications as outlined above and to follow up with any outpatient appointments as scheduled.    REASON FOR HOSPITALIZATION:   Suicidal thoughts in the context of difficulty acquiring sobriety and resulting unemployment, homelessness. He spent more than $3000 on cocaine in the past month.     HOSPITAL  COURSE:    1) Psychopharmacologic management and response: Patient was initially started on wellbutrin for cocaine withdrawal mitigation but was switched to effexor as he reported that he had anxiety and depression in the absence of drug use. Mirtazepine provided for insomnia.  Patient responded well to medication intervention with resolution of suicidal thoughts and significant improvement in depressed mood, hopelessness. Tolerated meds well. Risks and benefits were discussed in detail with patient/family and verbalized understanding and agreement with plan.     Patient is being discharged on  more than 1 antipsychotic? no    2) Behavioral issues: poor frustration tolerance, pattern of externalization    3) Diagnostic clarification: current episode likely due to substance use    4) Medical issues addressed: no acute needs    5) Tobacco cessation: provided tobacco replacement    CONDITION ON DISCHARGE:    Patient is being discharged in stable condition as patient no longer reports suicidal thoughts and has optimized their benefit from inpatient treatment.     -Self-care Ability: Full  -Cognitive Status: Appropriate for age/situation/diagnoses  -Pertinent MSE Findings at discharge: None     DISCHARGE DISPOSITION: provided resources for homeless shelter    cc: Primary Care Physician:  No Established Pcp  No address on file      Marcy Panning, MD

## 2015-11-22 NOTE — Discharge Summary (Signed)
[facility]                                              DISCHARGE SUMMARY      PATIENT NAMEBuel, [name]   MRN:  [id number]  DOB:  February 25, 1981    ADMISSION DATE:  10/17/2015  DISCHARGE DATE:  10/20/2015    ATTENDING PHYSICIAN: Marcy Panning, MD  PRIMARY CARE PHYSICIAN: No Established Pcp     DISCHARGE DIAGNOSIS:   Psychoactive substance-induced mood disorder (HCC)    Present on Admission:   Psychoactive substance-induced mood disorder (HCC)   Cocaine use disorder, severe, dependence (HCC)   Alcohol use disorder, mild, abuse   Mild heroin use disorder   Chronic low back pain                                                                            DISCHARGE MEDICATIONS:     Current Discharge Medication List      START taking these medications.       Details    cyclobenzaprine 10 mg Tablet   Commonly known as:  FLEXERIL    10 mg, Oral, 2x/day PRN   Qty:  20 Tab   Refills:  0         CONTINUE these medications - NO CHANGES were made during your visit.       Details    Mirtazapine 7.5 mg Tablet   Commonly known as:  REMERON    7.5 mg, Oral, HS PRN - MR x 1   Qty:  30 Tab   Refills:  0       nicotine 21 mg/24 hr Patch 24 hr   Commonly known as:  NICODERM CQ    21 mg, Transdermal, Daily   Qty:  28 Patch   Refills:  0       venlafaxine 75 mg Capsule, Sust. Release 24 hr   Commonly known as:  EFFEXOR XR    75 mg, Oral, Q24H   Qty:  30 Cap   Refills:  0               DISCHARGE INSTRUCTIONS: The patient will be discharged under care and supervision of self. They have been instructed to take medications as outlined above and to follow up with any outpatient appointments as scheduled.    REASON FOR HOSPITALIZATION:   Patient represented himself to the ED a day after discharge from our unit reported suicidal thoughts in the context of crack use.     HOSPITAL COURSE:    1) Psychopharmacologic management and response: Patient was restarted on prior meds started at his  last admission. Vistaril added prn for depression/anxiety  Patient responded well to medication intervention with resolution of suicidal thoughts and significant improvement in anxiety, depressed mood and insight. Tolerated meds well. Risks and benefits were discussed in detail with patient/family and verbalized understanding and agreement with plan.     Patient is being discharged on more than 1 antipsychotic? no    2) Behavioral issues: more cooperative on this admission  3) Diagnostic clarification: none    4) Medical issues addressed: no acute needs    5) Tobacco cessation: provided tobacco replacement    CONDITION ON DISCHARGE:    Patient is being discharged in stable condition as patient no longer reports suicidal thoughts and has optimized their benefit from inpatient treatment.     -Self-care Ability: Full  -Cognitive Status: Appropriate for age/situation/diagnoses  -Pertinent MSE Findings at discharge: None     DISCHARGE DISPOSITION: provided information for shelter    cc: Primary Care Physician:  No Established Pcp  No address on file      Marcy PanningHelen [place] Gandolfo, MD

## 2017-03-08 ENCOUNTER — Emergency Department (HOSPITAL_COMMUNITY)
Admission: EM | Admit: 2017-03-08 | Discharge: 2017-03-08 | Disposition: A | Discharge status: Home / Self Care | Payer: Self-pay | Attending: Emergency Medicine | Admitting: Emergency Medicine

## 2017-03-08 ENCOUNTER — Emergency Department (HOSPITAL_COMMUNITY)
Admission: EM | Admit: 2017-03-08 | Discharge: 2017-03-10 | Disposition: A | Discharge status: Home / Self Care | Payer: Self-pay | Attending: Emergency Medicine | Admitting: Emergency Medicine

## 2017-03-08 ENCOUNTER — Encounter (HOSPITAL_COMMUNITY): Payer: Self-pay | Admitting: *Deleted

## 2017-03-08 ENCOUNTER — Other Ambulatory Visit: Payer: Self-pay

## 2017-03-08 ENCOUNTER — Encounter (HOSPITAL_COMMUNITY): Payer: Self-pay | Admitting: Emergency Medicine

## 2017-03-08 DIAGNOSIS — L03114 Cellulitis of left upper limb: Secondary | ICD-10-CM | POA: Insufficient documentation

## 2017-03-08 DIAGNOSIS — F1994 Other psychoactive substance use, unspecified with psychoactive substance-induced mood disorder: Secondary | ICD-10-CM | POA: Diagnosis present

## 2017-03-08 DIAGNOSIS — F17200 Nicotine dependence, unspecified, uncomplicated: Secondary | ICD-10-CM | POA: Insufficient documentation

## 2017-03-08 DIAGNOSIS — F1920 Other psychoactive substance dependence, uncomplicated: Secondary | ICD-10-CM | POA: Insufficient documentation

## 2017-03-08 DIAGNOSIS — Z79899 Other long term (current) drug therapy: Secondary | ICD-10-CM | POA: Insufficient documentation

## 2017-03-08 DIAGNOSIS — R45851 Suicidal ideations: Secondary | ICD-10-CM | POA: Insufficient documentation

## 2017-03-08 DIAGNOSIS — F329 Major depressive disorder, single episode, unspecified: Secondary | ICD-10-CM | POA: Insufficient documentation

## 2017-03-08 DIAGNOSIS — L03119 Cellulitis of unspecified part of limb: Secondary | ICD-10-CM

## 2017-03-08 DIAGNOSIS — F332 Major depressive disorder, recurrent severe without psychotic features: Secondary | ICD-10-CM | POA: Diagnosis present

## 2017-03-08 DIAGNOSIS — F1594 Other stimulant use, unspecified with stimulant-induced mood disorder: Secondary | ICD-10-CM | POA: Diagnosis present

## 2017-03-08 DIAGNOSIS — F1910 Other psychoactive substance abuse, uncomplicated: Secondary | ICD-10-CM

## 2017-03-08 DIAGNOSIS — Z23 Encounter for immunization: Secondary | ICD-10-CM | POA: Insufficient documentation

## 2017-03-08 LAB — COMPREHENSIVE METABOLIC PANEL
ALT: 51 U/L (ref 17–63)
AST: 30 U/L (ref 15–41)
Albumin: 4.5 g/dL (ref 3.5–5.0)
Alkaline Phosphatase: 78 U/L (ref 38–126)
Anion gap: 9 (ref 5–15)
BUN: 14 mg/dL (ref 6–20)
CO2: 25 mmol/L (ref 22–32)
Calcium: 9.4 mg/dL (ref 8.9–10.3)
Chloride: 105 mmol/L (ref 101–111)
Creatinine, Ser: 0.88 mg/dL (ref 0.61–1.24)
GFR calc Af Amer: >60 mL/min (ref >60)
GFR calc non Af Amer: >60 mL/min (ref >60)
Glucose, Bld: 111 mg/dL — ABNORMAL HIGH (ref 65–99)
Potassium: 3.2 mmol/L — ABNORMAL LOW (ref 3.5–5.1)
Sodium: 139 mmol/L (ref 135–145)
Total Bilirubin: 0.9 mg/dL (ref 0.3–1.2)
Total Protein: 7.7 g/dL (ref 6.5–8.1)

## 2017-03-08 LAB — CBC WITH DIFFERENTIAL/PLATELET
Basophils Absolute: 0.1 10*3/uL (ref 0.0–0.1)
Basophils Relative: 1 %
Eosinophils Absolute: 0.2 10*3/uL (ref 0.0–0.7)
Eosinophils Relative: 2 %
HCT: 44.2 % (ref 39.0–52.0)
Hemoglobin: 14.8 g/dL (ref 13.0–17.0)
Lymphocytes Relative: 26 %
Lymphs Abs: 2.2 10*3/uL (ref 0.7–4.0)
MCH: 30.7 pg (ref 26.0–34.0)
MCHC: 33.5 g/dL (ref 30.0–36.0)
MCV: 91.7 fL (ref 78.0–100.0)
Monocytes Absolute: 0.7 10*3/uL (ref 0.1–1.0)
Monocytes Relative: 9 %
Neutro Abs: 5.2 10*3/uL (ref 1.7–7.7)
Neutrophils Relative %: 62 %
Platelets: 229 10*3/uL (ref 150–400)
RBC: 4.82 MIL/uL (ref 4.22–5.81)
RDW: 12.8 % (ref 11.5–15.5)
WBC: 8.4 10*3/uL (ref 4.0–10.5)

## 2017-03-08 LAB — RAPID URINE DRUG SCREEN, HOSP PERFORMED
Amphetamines: POSITIVE — AB
Barbiturates: NOT DETECTED
Benzodiazepines: POSITIVE — AB
Cocaine: POSITIVE — AB
Opiates: NOT DETECTED
Tetrahydrocannabinol: POSITIVE — AB

## 2017-03-08 LAB — ACETAMINOPHEN LEVEL: Acetaminophen (Tylenol), Serum: <10 ug/mL — ABNORMAL LOW (ref 10–30)

## 2017-03-08 LAB — SALICYLATE LEVEL: Salicylate Lvl: <7 mg/dL (ref 2.8–30.0)

## 2017-03-08 LAB — ETHANOL: Alcohol, Ethyl (B): <10 mg/dL (ref <10)

## 2017-03-08 MED ORDER — GABAPENTIN 300 MG PO CAPS
300.0000 mg | ORAL_CAPSULE | Freq: Three times a day (TID) | ORAL | Status: DC
  Filled 2017-03-08: qty 1

## 2017-03-08 MED ORDER — ONDANSETRON HCL 4 MG PO TABS: 4.0000 mg | ORAL_TABLET | Freq: Three times a day (TID) | ORAL | Status: DC | PRN

## 2017-03-08 MED ORDER — LIDOCAINE-EPINEPHRINE (PF) 2 %-1:200000 IJ SOLN
10.0000 mL | Freq: Once | INTRAMUSCULAR | Status: AC
  Administered 2017-03-08: 10 mL
  Filled 2017-03-08: qty 20

## 2017-03-08 MED ORDER — ACETAMINOPHEN 325 MG PO TABS
650.0000 mg | ORAL_TABLET | ORAL | Status: DC | PRN
  Administered 2017-03-09 – 2017-03-10 (×2): 650 mg via ORAL
  Filled 2017-03-08 (×2): qty 2

## 2017-03-08 MED ORDER — ALUM & MAG HYDROXIDE-SIMETH 200-200-20 MG/5ML PO SUSP
30.0000 mL | Freq: Four times a day (QID) | ORAL | Status: DC | PRN
  Administered 2017-03-10: 30 mL via ORAL
  Filled 2017-03-08: qty 30

## 2017-03-08 MED ORDER — DOXYCYCLINE HYCLATE 100 MG PO TABS
100.0000 mg | ORAL_TABLET | Freq: Two times a day (BID) | ORAL | Status: DC
  Administered 2017-03-08 – 2017-03-10 (×4): 100 mg via ORAL
  Filled 2017-03-08 (×4): qty 1

## 2017-03-08 NOTE — ED Provider Notes (Addendum)
[facility]   CSN: [id number] Arrival date & time: 03/08/17  7829     History   Chief Complaint Chief Complaint  Patient presents with  . Suicidal    HPI Latrel Szymczak is a 37 y.o. male.  Level 5 caveat for somnolence.  Patient with history of depression and polysubstance abuse presenting with G[employer] with thoughts of suicidal ideation.  Denies plan.  Denies hallucinations or homicidal ideation.  Patient falls back asleep quickly with questioning will not stay awake to answer questions.  He admits to the binging on multiple drugs including crack, meth, cocaine and injecting these on a daily basis last use yesterday as well.  Denies any headache, chest pain, abdominal pain, back pain.  No focal weakness, numbness or tingling.  Only prescribed medications Wellbutrin which he has not been compliant with.   The history is provided by the patient. The history is limited by the condition of the patient.    Past Medical History:  Diagnosis Date  . Anxiety   . Arthritis   . Depression     Patient Active Problem List   Diagnosis Date Noted  . Alcoholism (HCC) 09/12/2014  . Substance abuse (HCC) 09/12/2014    Past Surgical History:  Procedure Laterality Date  . GALLBLADDER SURGERY         Home Medications    Prior to Admission medications   Medication Sig Start Date End Date Taking? Authorizing Provider  buPROPion (WELLBUTRIN XL) 300 MG 24 hr tablet Take 300 mg by mouth daily. 12/27/16  Yes [provider]    Family History Family History  Problem Relation Age of Onset  . Diabetes Mother   . Diabetes Maternal Grandfather     Social History Social History   Tobacco Use  . Smoking status: Current Every Day Smoker  . Smokeless tobacco: Never Used  Substance Use Topics  . Alcohol use: Not on file  . Drug use: Yes    Types: Cocaine     Allergies   Patient has no known allergies.   Review of  Systems Review of Systems  Unable to perform ROS: Mental status change     Physical Exam Updated Vital Signs BP 138/76 (BP Location: Right Arm)   Pulse 89   Temp 98 F (36.7 C) (Oral)   Resp 18   SpO2 99%   Physical Exam  Constitutional: He is oriented to person, place, and time. He appears well-developed and well-nourished. No distress.  Somnolent, smells of alcohol, answers questions but quickly falls back asleep  HENT:  Head: Normocephalic and atraumatic.  Mouth/Throat: Oropharynx is clear and moist. No oropharyngeal exudate.  Eyes: Conjunctivae and EOM are normal. Pupils are equal, round, and reactive to light.  Neck: Normal range of motion. Neck supple.  No meningismus.  Cardiovascular: Normal rate, regular rhythm, normal heart sounds and intact distal pulses.  No murmur heard. Pulmonary/Chest: Effort normal and breath sounds normal. No respiratory distress. He exhibits no tenderness.  Abdominal: Soft. There is no tenderness. There is no rebound and no guarding.  Musculoskeletal: Normal range of motion. He exhibits no edema or tenderness.  Multiple track marks  Neurological: He is alert and oriented to person, place, and time. No cranial nerve deficit. He exhibits normal muscle tone. Coordination normal.  No ataxia on finger to nose bilaterally. No pronator drift. 5/5 strength throughout. CN 2-12 intact.Equal grip strength. Sensation intact.   Skin: Skin is warm. Capillary refill takes less than  2 seconds.  Psychiatric: He has a normal mood and affect. His behavior is normal.  Nursing note and vitals reviewed.    ED Treatments / Results  Labs (all labs ordered are listed, but only abnormal results are displayed) Labs Reviewed  COMPREHENSIVE METABOLIC PANEL - Abnormal; Notable for the following components:      Result Value   Potassium 3.2 (*)    Glucose, Bld 111 (*)    All other components within normal limits  ACETAMINOPHEN LEVEL - Abnormal; Notable for the  following components:   Acetaminophen (Tylenol), Serum <10 (*)    All other components within normal limits  CBC WITH DIFFERENTIAL/PLATELET  ETHANOL  SALICYLATE LEVEL  RAPID URINE DRUG SCREEN, HOSP PERFORMED  URINALYSIS, ROUTINE W REFLEX MICROSCOPIC    EKG  EKG Interpretation None       Radiology No results found.  Procedures Procedures (including critical care time)  Medications Ordered in ED Medications - No data to display   Initial Impression / Assessment and Plan / ED Course  I have reviewed the triage vital signs and the nursing notes.  Pertinent labs & imaging results that were available during my care of the patient were reviewed by me and considered in my medical decision making (see chart for details).    Patient not very forthcoming with history.  Polysubstance abuser who presents with suicidal ideation without a plan.  Admits to using crack, cocaine, methamphetamine.  Patient difficult to keep awake.  Vitals are stable. Ethanol level is negative.  Holding orders are placed.  TTS consult is initiated. Final Clinical Impressions(s) / ED Diagnoses   Final diagnoses:  None    ED Discharge Orders    None       Trase Bunda, Jeannett SeniorStephen, MD 03/08/17 [id number]    Glynn Octaveancour, Renley Banwart, MD 03/08/17 2011

## 2017-03-08 NOTE — ED Notes (Signed)
ED Provider at bedside. 

## 2017-03-08 NOTE — ED Provider Notes (Signed)
[facility] Provider Note   CSN: [id number] Arrival date & time: 03/08/17  1916     History   Chief Complaint Chief Complaint  Patient presents with  . Suicidal    HPI Robert SinclairJoseph [name] is a 37 y.o. male.  HPI Patient presents suicidal.  [facility] and discharged this evening.  Reportedly discharged because he would not really talk to any of the people wanting to evaluate him.  Polysubstance abuse.  Last use yesterday.  Use occasional opiates but mostly stimulants such as cocaine and methamphetamine.  Also suicidal now.  States he is tired of being an addict.  Plans to cut his wrist. Past Medical History:  Diagnosis Date  . Anxiety   . Arthritis   . Depression     Patient Active Problem List   Diagnosis Date Noted  . Substance induced mood disorder (HCC) 03/08/2017  . Alcoholism (HCC) 09/12/2014    Past Surgical History:  Procedure Laterality Date  . GALLBLADDER SURGERY         Home Medications    Prior to Admission medications   Medication Sig Start Date End Date Taking? Authorizing Provider  buPROPion (WELLBUTRIN XL) 300 MG 24 hr tablet Take 300 mg by mouth daily. 12/27/16  Yes [provider]    Family History Family History  Problem Relation Age of Onset  . Diabetes Mother   . Diabetes Maternal Grandfather     Social History Social History   Tobacco Use  . Smoking status: Current Every Day Smoker  . Smokeless tobacco: Never Used  Substance Use Topics  . Alcohol use: Yes    Alcohol/week: 0.0 oz  . Drug use: Yes    Types: Cocaine, Heroin     Allergies   Patient has no known allergies.   Review of Systems Review of Systems  Constitutional: Positive for appetite change and fatigue. Negative for fever.  HENT: Negative for congestion.   Respiratory: Negative for shortness of breath.   Cardiovascular: Negative for chest pain.  Gastrointestinal: Negative for abdominal pain.    Genitourinary: Negative for frequency.  Musculoskeletal: Negative for back pain.  Skin: Negative for rash.  Neurological: Negative for weakness.  Psychiatric/Behavioral: Positive for suicidal ideas.     Physical Exam Updated Vital Signs BP 133/84   Pulse 92   Temp 98 F (36.7 C) (Oral)   Resp 16   SpO2 98%   Physical Exam  Constitutional: He appears well-developed.  Eyes: Pupils are equal, round, and reactive to light.  Neck: Neck supple.  Cardiovascular: Normal rate.  Pulmonary/Chest: Effort normal.  Musculoskeletal:  Swelling and erythema of the dorsum of the right hand.  Good movement of fingers.  No fluctuance.  Left distal upper arm has tender indurated area.  No fluctuance.  Some erythema.  Neurological: He is alert.  Skin: Skin is warm. Capillary refill takes less than 2 seconds.  Psychiatric: His behavior is normal.     ED Treatments / Results  Labs (all labs ordered are listed, but only abnormal results are displayed) Labs Reviewed  RAPID URINE DRUG SCREEN, HOSP PERFORMED - Abnormal; Notable for the following components:      Result Value   Cocaine POSITIVE (*)    Benzodiazepines POSITIVE (*)    Amphetamines POSITIVE (*)    Tetrahydrocannabinol POSITIVE (*)    All other components within normal limits    EKG  EKG Interpretation None       Radiology No results  found.  Procedures Procedures (including critical care time)  Medications Ordered in ED Medications  doxycycline (VIBRA-TABS) tablet 100 mg (100 mg Oral Given 03/08/17 2327)  acetaminophen (TYLENOL) tablet 650 mg (not administered)  ondansetron (ZOFRAN) tablet 4 mg (not administered)  alum & mag hydroxide-simeth (MAALOX/MYLANTA) 200-200-20 MG/5ML suspension 30 mL (not administered)  lidocaine-EPINEPHrine (XYLOCAINE W/EPI) 2 %-1:200000 (PF) injection 10 mL (10 mLs Infiltration Given 03/08/17 2155)     Initial Impression / Assessment and Plan / ED Course  I have reviewed the triage vital  signs and the nursing notes.  Pertinent labs & imaging results that were available during my care of the patient were reviewed by me and considered in my medical decision making (see chart for details).     Patient with polysubstance abuse.  Also reported suicidal thoughts.  Seen by TTS and discussed with psychiatry that requested reevaluation in the morning.  Has right hand and left upper arm cellulitis.  Attempted I&D on swelling but no real purulent return.  Antibiotic started.  Medically cleared.  Final Clinical Impressions(s) / ED Diagnoses   Final diagnoses:  Suicidal ideation  Polysubstance abuse (HCC)  Cellulitis of hand  Left arm cellulitis    ED Discharge Orders    None      Benjiman Core, MD 03/08/17 2334

## 2017-03-08 NOTE — ED Triage Notes (Signed)
Pt presents for suicidal ideations with plan to shoot self or slit wrists. Reports ongoing suicidal ideations for a couple of months since relapsing to heroin and cocaine. Pt also reports abscess to L arm from IV drug use and reports chronic back and neck pain. Last drug use was last night. Has been on Wellbutrin in the past for depression but has not taken any "because it doesn't help." [facility] and reports "being kicked out because I wouldn't wake up to talk to them."

## 2017-03-08 NOTE — ED Notes (Signed)
Bed: WTR7 Expected date:  Expected time:  Means of arrival:  Comments: 

## 2017-03-08 NOTE — ED Notes (Signed)
Sandwich and sprite given. Pt changed into paper scrubs. Pt currently making phone call to mother
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
## 2017-03-08 NOTE — [facility] Suicide Risk Assessment (Signed)
Suicide Risk Assessment  Discharge Assessment   [facility]   Principal Problem: Substance induced mood disorder [facility](HCC) Discharge Diagnoses:  Patient Active Problem List   Diagnosis Date Noted  . Substance induced mood disorder (HCC) [F19.94] 03/08/2017    Priority: High  . Alcoholism (HCC) [F10.20] 09/12/2014    Total Time spent with patient: 45 minutes  Musculoskeletal: Strength & Muscle Tone: within normal limits Gait & Station: normal Patient leans: N/A  Psychiatric Specialty Exam:   Blood pressure 119/60, pulse 84, temperature 98 F (36.7 C), temperature source Oral, resp. rate 16, SpO2 95 %.There is no height or weight on file to calculate BMI.  General Appearance: Casual  Eye Contact::  Good  Speech:  Clear and Coherent409  Volume:  Normal  Mood:  Irritable  Affect:  Congruent  Thought Process:  Coherent and Descriptions of Associations: Intact  Orientation:  Full (Time, Place, and Person)  Thought Content:  WDL and Logical  Suicidal Thoughts:  No  Homicidal Thoughts:  No  Memory:  Immediate;   Good Recent;   Good Remote;   Good  Judgement:  Fair  Insight:  Fair  Psychomotor Activity:  Normal  Concentration:  Good  Recall:  Good  Fund of Knowledge:Good  Language: Good  Akathisia:  No  Handed:  Right  AIMS (if indicated):     Assets:  Leisure Time Physical Health Resilience Social Support  Sleep:     Cognition: WNL  ADL's:  Intact   Mental Status Per Nursing Assessment::   On Admission:   37 yo male who presented to the ED with polysubstance abuse.  He slept most of the time or was demanding food and sodas.  Did not engage with Peer Support for assistance with substance abuse.  Cursing and demanding but no suicidal/homicidal ideations, hallucinations, or withdrawal symptoms.  Stable for discharge.  Demographic Factors:  Male and Caucasian  Loss Factors: NA  Historical Factors: NA  Risk Reduction Factors:   Sense of  responsibility to family and Positive social support  Continued Clinical Symptoms:  Irritable  Cognitive Features That Contribute To Risk:  None    Suicide Risk:  Minimal: No identifiable suicidal ideation.  Patients presenting with no risk factors but with morbid ruminations; may be classified as minimal risk based on the severity of the depressive symptoms    Plan Of Care/Follow-up recommendations:  Activity:  as tolerated Diet:  heart healthy diet  LORD, JAMISON, NP 03/08/2017, 4:55 PM

## 2017-03-08 NOTE — Progress Notes (Signed)
37 yo male who presented to the ED with polysubstance abuse.  He slept most of the time or was demanding food and sodas.  Did not engage with Peer Support for assistance with substance abuse.  Cursing and demanding but no suicidal/homicidal ideations, hallucinations, or withdrawal symptoms.  Stable for discharge.  Nanine MeansJamison Marissa Weaver, PMHNP
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
## 2017-03-08 NOTE — ED Notes (Signed)
Pt presents with SI, no plan and depression.  Denies HI or AVH.  Feeling hopeless.  Pt admits to crack cocaine, meth, heroin abuse.  A&O x 3, no distress noted, calm & cooperative.  Monitoring for safety, Q 15 min checks in effect.

## 2017-03-08 NOTE — ED Notes (Signed)
Bed: [id number] Expected date:  Expected time:  Means of arrival:  Comments: TR7

## 2017-03-08 NOTE — ED Notes (Signed)
Placed 1 shopping bag of belongings in locker 31. Pt also has one red piece of luggage just too large to put into locker that was placed behind the TCU nurses station.

## 2017-03-08 NOTE — ED Notes (Addendum)
Psychiatry came by to see pt. Pt did not want to wake up. Pt resting comfortably
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
## 2017-03-08 NOTE — ED Notes (Signed)
TTS cart at bedside. Pt asleep

## 2017-03-08 NOTE — ED Notes (Signed)
TTS at bedside. 

## 2017-03-08 NOTE — ED Notes (Signed)
Pt belonging bag added to pt belongings in locker 31. Pt denies any valuables in belongings bags.

## 2017-03-08 NOTE — ED Triage Notes (Signed)
Patient brought in by [facility] voluntary with complaints of suicidal ideation without a plan. States that he has been on "a drug binge", crack cocaine.

## 2017-03-09 DIAGNOSIS — F17210 Nicotine dependence, cigarettes, uncomplicated: Secondary | ICD-10-CM

## 2017-03-09 DIAGNOSIS — F1994 Other psychoactive substance use, unspecified with psychoactive substance-induced mood disorder: Secondary | ICD-10-CM

## 2017-03-09 DIAGNOSIS — G4700 Insomnia, unspecified: Secondary | ICD-10-CM

## 2017-03-09 DIAGNOSIS — F419 Anxiety disorder, unspecified: Secondary | ICD-10-CM

## 2017-03-09 DIAGNOSIS — F332 Major depressive disorder, recurrent severe without psychotic features: Secondary | ICD-10-CM

## 2017-03-09 DIAGNOSIS — R45851 Suicidal ideations: Secondary | ICD-10-CM

## 2017-03-09 DIAGNOSIS — R450 Nervousness: Secondary | ICD-10-CM

## 2017-03-09 HISTORY — DX: Major depressive disorder, recurrent severe without psychotic features: F33.2

## 2017-03-09 MED ORDER — BUPROPION HCL ER (XL) 150 MG PO TB24: 300.0000 mg | ORAL_TABLET | Freq: Every day | ORAL | Status: DC

## 2017-03-09 MED ORDER — NICOTINE 21 MG/24HR TD PT24
21.0000 mg | MEDICATED_PATCH | Freq: Every day | TRANSDERMAL | Status: DC
  Administered 2017-03-09 – 2017-03-10 (×2): 21 mg via TRANSDERMAL
  Filled 2017-03-09 (×2): qty 1

## 2017-03-09 MED ORDER — ACETAMINOPHEN 325 MG PO TABS
650.0000 mg | ORAL_TABLET | Freq: Once | ORAL | Status: AC
  Administered 2017-03-09: 650 mg via ORAL
  Filled 2017-03-09: qty 2

## 2017-03-09 MED ORDER — TETANUS-DIPHTH-ACELL PERTUSSIS 5-2.5-18.5 LF-MCG/0.5 IM SUSP
0.5000 mL | Freq: Once | INTRAMUSCULAR | Status: AC
  Administered 2017-03-09: 0.5 mL via INTRAMUSCULAR
  Filled 2017-03-09: qty 0.5

## 2017-03-09 MED ORDER — POTASSIUM CHLORIDE CRYS ER 20 MEQ PO TBCR
40.0000 meq | EXTENDED_RELEASE_TABLET | Freq: Once | ORAL | Status: AC
Start: 2017-03-09 — End: 2017-03-09
  Administered 2017-03-09: 40 meq via ORAL
  Filled 2017-03-09: qty 2

## 2017-03-09 MED ORDER — NICOTINE 21 MG/24HR TD PT24: 21.0000 mg | MEDICATED_PATCH | Freq: Every day | TRANSDERMAL | Status: DC

## 2017-03-09 MED ORDER — BUPROPION HCL ER (XL) 150 MG PO TB24
300.0000 mg | ORAL_TABLET | Freq: Every day | ORAL | Status: DC
  Administered 2017-03-09 – 2017-03-10 (×2): 300 mg via ORAL
  Filled 2017-03-09 (×2): qty 2

## 2017-03-09 NOTE — ED Notes (Signed)
Patients belongings secured in POD F locker #6

## 2017-03-09 NOTE — ED Notes (Signed)
Snack and drink given 

## 2017-03-09 NOTE — ED Notes (Signed)
Pt called his mother, Bjorn LoserRhonda -- [phone]. Pt noted to be cursing loudly on the phone even after much encouragement to lower voice. Pt stated "They don't know what's going on with me. I don't know who Renata CapriceConrad is and what he's thinking". Pt requested for his mother to call [facility] to advise them what is going on w/him. Pt signed consent form - copy faxed to [facility], copy sent to Medical Records, and original placed on clipboard.

## 2017-03-09 NOTE — ED Notes (Addendum)
Pt ambulated to F8 - copy of Medical Clearance Pt Policy form given to pt that he had signed earlier. Pt asking for snack - voiced understanding of meal times. Pt noted to be calm, cooperative.

## 2017-03-09 NOTE — ED Notes (Signed)
Pt woke and asking for snack - given.
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
## 2017-03-09 NOTE — ED Notes (Signed)
Pt ambulatory to shower w/NT. 

## 2017-03-09 NOTE — ED Provider Notes (Signed)
10:15 AM patient resting comfortably.  No distress.  Alert Glasgow Coma Score 15.  Requesting pain medicine for left arm pain at site of I&D which occurred yesterday.  Left arm there is an open wound proximally 1 cm in diameter with surrounding redness and tenderness.  Pulse 2+.  Good capillary refill.  Tylenol ordered.  Wound dressed redressed.Tdap updated as well as oral potassium supplementation ordered   Doug SouJacubowitz, Gordy Goar, MD 03/09/17 1023

## 2017-03-09 NOTE — ED Notes (Signed)
Telepsych performed. Pt asking when he will "speak with someone else". Advised pt waiting for consult note then will be able to advise - voiced understanding.

## 2017-03-09 NOTE — ED Notes (Signed)
Pt requesting for home med - Wellbutrin to be given and Nicotine Patch. Dr Patria Maneampos aware.

## 2017-03-09 NOTE — ED Notes (Signed)
Pt up to restroom.

## 2017-03-10 DIAGNOSIS — Z915 Personal history of self-harm: Secondary | ICD-10-CM

## 2017-03-10 DIAGNOSIS — Z590 Homelessness: Secondary | ICD-10-CM

## 2017-03-10 MED ORDER — DOXYCYCLINE HYCLATE 100 MG PO CAPS: 100.0000 mg | ORAL_CAPSULE | Freq: Two times a day (BID) | ORAL | 0 refills | Status: DC

## 2017-03-10 NOTE — ED Notes (Signed)
Pt does not need dressing changes. Wound is closed but needs covered. A Band-Aid will be sufficient.

## 2017-03-10 NOTE — ED Notes (Signed)
Pt standing at desk on phone to get ride.

## 2017-03-10 NOTE — ED Notes (Signed)
Pt called back stating he had lost his perscription. RX called [facility] [address] at request of pt after discussed with Dr. Rush Landmarkegeler. RX for doxycycline 100 mg bid po x 7 days. #14 no refills.

## 2017-03-10 NOTE — Discharge Instructions (Signed)
Follow-up for treatment at the recovery center of your choice.  Use heat on the sore area, of your arm, with the infection.  Do this several times each day.  Return here, if needed, for problems.

## 2017-03-10 NOTE — ED Provider Notes (Signed)
The patient was evaluated by me. He is not actively suicidal and does not have a suicidal plan. He is abusing illegal drugs and feels depressed. He relapsed several weeks ago. He is seeking further treatment for substance abuse.  The patient is stable for discharge from the ED and to be treated in an Outpatient Psychiatric facility.     Mancel BaleWentz, Allona Gondek, MD 03/10/17 [phone]

## 2017-03-10 NOTE — ED Notes (Addendum)
Pt states he understands instructions. Home stable with steady gait.  Band aid applied to wound at left arm.

## 2017-03-10 NOTE — ED Notes (Signed)
Breakfast tray ordered 

## 2017-03-10 NOTE — Progress Notes (Signed)
Per Hillery Jacksanika Lewis, NP, the patient does not meet criteria for inpatient treatment. The patient is recommended for discharge and to follow up with an outpatient provider.   The patient is psychiatrically cleared at this time. The patient was seen via [facility]psych with [facility], Hillery Jacksanika Lewis, NP. Patient's chart was reviewed and consulted with Dr. Lucianne MussKumar on 03/10/17.   Charlton AmorNicole Kohut, RN notified.    Baldo DaubJolan Priscilla Kirstein, MSW, LCSWA Clinical Social Worker (Disposition) [facility]  [phone]

## 2017-03-10 NOTE — Consult Note (Signed)
Telepsych Consultation   Reason for Consult:  Suicidal ideation Referring Physician:  EPD Location of Patient:  Location of Provider: [facility]  Patient Identification: Robert SinclairJoseph [name] MRN:  [id number] Principal Diagnosis: Substance induced mood disorder (HCC) Diagnosis:   Patient Active Problem List   Diagnosis Date Noted  . MDD (major depressive disorder), recurrent severe, without psychosis (HCC) [F33.2] 03/09/2017  . Substance induced mood disorder (HCC) [F19.94] 03/08/2017  . Alcoholism (HCC) [F10.20] 09/12/2014    Total Time spent with patient: 20 minutes  Subjective:   Robert [name] is a 37 y.o. male seen via tele assessment, case was staffed with MD Lucianne MussKumar and treatment team.  Patient present with irritably and is guarded during this assessment. Reports he is unable to go back to [facility] because he can't stop using drugs. Reports he has been off his Wellbutrin for the past 1 years and states he was restarted while in the hospital. Patient is requesting detox from cocaine and heroin.  Reports he was told that he was going inpatient on yesterday. Discussed patient to follow-up with outpatient and substance abuse. TTS to provide additional resources. Noted that patient  reports inpatient [facility] admissions [facility] [place] for 4 mos and [facility].Support, Encouragement  and Reassurance was provided   HPI:  Past Psychiatric History:  Per consult note-Robert [name] is a 37 y.o. male patient admitted with reports of suicidal ideation with plan to OD on heroin or shoot self in head. Pt seen and chart reviewed. Pt is alert/oriented x4, calm, cooperative, and appropriate to situation. Pt denies homicidal ideation and psychosis and does not appear to be responding to internal stimuli. Pt stated approximately 5 times during the assessment that he wants to shoot himself with a gun or OD on heroin to die and that he has no reason for living. Pt reports that  [facility] this numerous times yesterday and that nothing has changed. There have been some inconsistencies in his presentation yet this subjective reporting is too concerning to consider discharge at this time, even in the presence of high potential for secondary gain (see disposition plan at end of note).    HPI:  I have reviewed and concur with HPI elements below, modified as follows:  Robert MayhewJoseph [name] an 37 y.o.singlemalewho presents [facility]ac[facility] reporting substance abuse and suicidal ideation.Pt was evaluated earlier today at [facility] ED, psychiatrically cleared with referrals to outpatient resources and then [facility]. Pt reports current suicidal ideation with plan to overdose on heroin or shoot himself in the head. He says he has access to heroin and believes he could acquire a firearm but current has no access. He denies any intentional suicide attempts but says he has accidentally overdosed on drugs in the past. He denies current homicidal ideation and says he has been charged with assault in the past. He denies any history of auditory or visual hallucinations except when he has been severely intoxicated on substances. Pt reports he is using alcohol, cocaine, heroin and methamphetamines (see below for details of use). He states he is currently homeless and that he was [facility] for using [facility]. He cannot identify any family or friends who are supportive. Pt says he is "losing hope" and believes no one wants to help him.  Pt is dressed in hospital scrubs, alert and oriented x4. Pt speaks in a clear tone, at moderate volume and  normal pace. Motor behavior appears normal. Eye contact ispoor. Pt's mood is depressedand irritableand affect is congruent with mood. Thought process is coherent and relevant. There is no indication Pt is currently responding to internal stimuli or experiencing delusional thought content.  Pt was impatient but generally cooperative during assessment. He says he cannot contract for safety outside the hospital.  ASSESSMENT NOTE BY CAROLINE PAIGE MCLEAN, LCSW 03/08/17 at 1622: Robert [name].Pt [facility]. Pt is extremely drowsy and falls asleep several times during assessment. He wakes up easily. Pt is cooperative and oriented x 4. He now denies SI.Pt denies any history of suicide attempts and denies history of self-mutilation. He reports after 15 mos of clean and sober time, pt relapsed on 02/17/17. (See below for substance use details). He reports using heroin and crack by injection and drinking etoh. Pt sts he was recently kicked out of [employer]. Pt reports no social support system since his relapse. He reports he knows nothing about his bio dad. Pt reports hx of substance abuse and mental illness on his mom's side of family. He denies hx seizures. 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 Pt endorses anhedonia, irritability, fatigue, guilt and sadness. He reports hx of verbal abuse. Pt reports inpatient treatment admissions to [facility] in [place] for 4 mos and [facility]. He reports "aggravated" mood.Pt denies homicidal thoughts or physical aggression. Pt denies having access to firearms.He reports having five DUIS.Pt denies having any legal problems at this time.Pt denies hallucinations. Pt does not appear to be responding to internal stimuli and exhibits no delusional thought. Pt's reality testing appears to be intact.     Risk to Self: Suicidal Ideation: Yes-Currently Present Suicidal Intent: Yes-Currently Present Is patient at risk for suicide?: Yes Suicidal Plan?: Yes-Currently Present Specify Current Suicidal Plan: Overdose on heroin or shoot himself in the head Access to Means: Yes Specify Access to Suicidal Means: Pt reports access to heroin What has been your use of  drugs/alcohol within the last 12 months?: Pt reports using alcohol, cocaine, heroin and methamphetamines How many times?: 0 Other Self Harm Risks: Pt reports history of accidental overdose Triggers for Past Attempts: None known Intentional Self Injurious Behavior: None Risk to Others: Homicidal Ideation: No Thoughts of Harm to Others: No Current Homicidal Intent: No Current Homicidal Plan: No Access to Homicidal Means: No Identified Victim: None History of harm to others?: No Assessment of Violence: In distant past Violent Behavior Description: Pt reports he has been charged with assault in the past Does patient have access to weapons?: No Criminal Charges Pending?: No Does patient have a court date: No Prior Inpatient Therapy: Prior Inpatient Therapy: Yes Prior Therapy Dates: over severalyears Prior Therapy Facilty/Provider(s): [facility], [address] in [place] (4 mos) & [facility] Reason for Treatment: substance abuse, detox Prior Outpatient Therapy: Prior Outpatient Therapy: No Prior Therapy Dates: NA Prior Therapy Facilty/Provider(s): NA Reason for Treatment: NA Does patient have an ACCT team?: No Does patient have Intensive In-House Services?  : No Does patient have [facility] services? : No Does patient have P4CC services?: No  Past Medical History:  Past Medical History:  Diagnosis Date  . Anxiety   . Arthritis   . Depression     Past Surgical History:  Procedure Laterality Date  . GALLBLADDER SURGERY     Family History:  Family History  Problem Relation Age of Onset  .  Diabetes Mother   . Diabetes Maternal Grandfather    Family Psychiatric  History:  Social History:  Social History   Substance and Sexual Activity  Alcohol Use Yes  . Alcohol/week: 0.0 oz     Social History   Substance and Sexual Activity  Drug Use Yes  . Types: Cocaine, Heroin    Social History   Socioeconomic History  . Marital status: Single    Spouse name: None  . Number  of children: None  . Years of education: None  . Highest education level: None  Social Needs  . Financial resource strain: None  . Food insecurity - worry: None  . Food insecurity - inability: None  . Transportation needs - medical: None  . Transportation needs - non-medical: None  Occupational History  . None  Tobacco Use  . Smoking status: Current Every Day Smoker  . Smokeless tobacco: Never Used  Substance and Sexual Activity  . Alcohol use: Yes    Alcohol/week: 0.0 oz  . Drug use: Yes    Types: Cocaine, Heroin  . Sexual activity: None  Other Topics Concern  . None  Social History Narrative  . None   Additional Social History:    Allergies:  No Known Allergies  Labs:  Results for orders placed or performed during the hospital encounter of 03/08/17 (from the past 48 hour(s))  Rapid urine drug screen (hospital performed)     Status: Abnormal   Collection Time: 03/08/17  7:53 PM  Result Value Ref Range   Opiates NONE DETECTED NONE DETECTED   Cocaine POSITIVE (A) NONE DETECTED   Benzodiazepines POSITIVE (A) NONE DETECTED   Amphetamines POSITIVE (A) NONE DETECTED   Tetrahydrocannabinol POSITIVE (A) NONE DETECTED   Barbiturates NONE DETECTED NONE DETECTED    Comment: (NOTE) DRUG SCREEN FOR MEDICAL PURPOSES ONLY.  IF CONFIRMATION IS NEEDED FOR ANY PURPOSE, NOTIFY LAB WITHIN 5 DAYS. LOWEST DETECTABLE LIMITS FOR URINE DRUG SCREEN Drug Class                     Cutoff (ng/mL) Amphetamine and metabolites    1000 Barbiturate and metabolites    200 Benzodiazepine                 200 Tricyclics and metabolites     300 Opiates and metabolites        300 Cocaine and metabolites        300 THC                            50     Medications:  Current Facility-Administered Medications  Medication Dose Route Frequency Provider Last Rate Last Dose  . acetaminophen (TYLENOL) tablet 650 mg  650 mg Oral Q4H PRN Benjiman Core, MD   650 mg at 03/10/17 0109  . alum & mag  hydroxide-simeth (MAALOX/MYLANTA) 200-200-20 MG/5ML suspension 30 mL  30 mL Oral Q6H PRN Benjiman Core, MD      . buPROPion (WELLBUTRIN XL) 24 hr tablet 300 mg  300 mg Oral Daily Terrilee Files, MD   300 mg at 03/10/17 0933  . doxycycline (VIBRA-TABS) tablet 100 mg  100 mg Oral BID Benjiman Core, MD   100 mg at 03/10/17 0933  . nicotine (NICODERM CQ - dosed in mg/24 hours) patch 21 mg  21 mg Transdermal Daily Terrilee Files, MD   21 mg at 03/10/17 0936  . ondansetron (ZOFRAN) tablet 4  mg  4 mg Oral Q8H PRN Benjiman Core, MD       Current Outpatient Medications  Medication Sig Dispense Refill  . buPROPion (WELLBUTRIN XL) 300 MG 24 hr tablet Take 300 mg by mouth daily.  0    Musculoskeletal: Strength & Muscle Tone: UTA teleassessment Gait & Station: UTA Patient leans: N/A  Psychiatric Specialty Exam: Physical Exam  Vitals reviewed. Constitutional: He appears well-developed.  Cardiovascular: Normal rate.  Neurological: He is alert.  Psychiatric: He has a normal mood and affect.    Review of Systems  Psychiatric/Behavioral: Positive for depression, substance abuse and suicidal ideas. The patient is nervous/anxious.     Blood pressure 115/69, pulse 86, temperature 98 F (36.7 C), temperature source Oral, resp. rate 18, SpO2 98 %.There is no height or weight on file to calculate BMI.  General Appearance: Casual  Eye Contact:  Fair  Speech:  Clear and Coherent  Volume:  Normal  Mood:  Anxious and Depressed  Affect:  Congruent  Thought Process:  Coherent  Orientation:  Full (Time, Place, and Person)  Thought Content:  Hallucinations: None  Suicidal Thoughts: Yes, reports suicidal ideation due to he was recently discharged from the [facility]- due to substance abuse. TTS to provided resources for housing and substance abuse treatment.   Homicidal Thoughts:  No  Memory:  Immediate;   Fair Recent;   Fair Remote;   Fair  Judgement:  Fair  Insight:  Fair   Psychomotor Activity:  Normal  Concentration:  Concentration: Fair  Recall:  [employer] of Knowledge:  Fair  Language:  Fair  Akathisia:  No  Handed:    AIMS (if indicated):     Assets:  Communication Skills Desire for Improvement Resilience Social Support  ADL's:  Intact  Cognition:  WNL  Sleep:        Disposition: Patient does not meet criteria for psychiatric inpatient admission. Supportive therapy provided about ongoing stressors. Discussed crisis plan, support from social network, calling 911, coming to the Emergency Department, and calling Suicide Hotline.   TTS to faxed additional resources and local shelter    This service was provided via telemedicine using a 2-way, interactive audio and video technology.  Names of all persons participating in this telemedicine service and their role in this encounter. Name:  Merry Proud Role:TTS  Name: candy peters Role: RN          Oneta Rack, NP 03/10/2017 12:28 PM

## 2017-06-25 ENCOUNTER — Encounter (HOSPITAL_COMMUNITY): Payer: Self-pay | Admitting: Emergency Medicine

## 2017-06-25 ENCOUNTER — Other Ambulatory Visit: Payer: Self-pay

## 2017-06-25 ENCOUNTER — Emergency Department (HOSPITAL_COMMUNITY)
Admission: EM | Admit: 2017-06-25 | Discharge: 2017-06-26 | Disposition: A | Discharge status: Home / Self Care | Payer: Self-pay | Attending: Emergency Medicine | Admitting: Emergency Medicine

## 2017-06-25 DIAGNOSIS — F1910 Other psychoactive substance abuse, uncomplicated: Secondary | ICD-10-CM | POA: Insufficient documentation

## 2017-06-25 DIAGNOSIS — Z79899 Other long term (current) drug therapy: Secondary | ICD-10-CM | POA: Insufficient documentation

## 2017-06-25 DIAGNOSIS — F17210 Nicotine dependence, cigarettes, uncomplicated: Secondary | ICD-10-CM | POA: Insufficient documentation

## 2017-06-25 MED ORDER — CLONIDINE HCL 0.1 MG PO TABS
0.1000 mg | ORAL_TABLET | Freq: Once | ORAL | Status: AC
  Administered 2017-06-26: 0.1 mg via ORAL
  Filled 2017-06-25: qty 1

## 2017-06-25 NOTE — ED Triage Notes (Signed)
Pt reports to using meth and cocaine for the last few days and has not been sleeping. Pt denies any SI/HI.

## 2017-06-25 NOTE — ED Notes (Signed)
Bed: [facility] Expected date:  Expected time:  Means of arrival:  Comments: 

## 2017-06-26 LAB — CBC
HCT: 47.5 % (ref 39.0–52.0)
Hemoglobin: 16.1 g/dL (ref 13.0–17.0)
MCH: 31 pg (ref 26.0–34.0)
MCHC: 33.9 g/dL (ref 30.0–36.0)
MCV: 91.5 fL (ref 78.0–100.0)
Platelets: 255 10*3/uL (ref 150–400)
RBC: 5.19 MIL/uL (ref 4.22–5.81)
RDW: 12.8 % (ref 11.5–15.5)
WBC: 10 10*3/uL (ref 4.0–10.5)

## 2017-06-26 LAB — COMPREHENSIVE METABOLIC PANEL
ALT: 37 U/L (ref 17–63)
AST: 29 U/L (ref 15–41)
Albumin: 4.9 g/dL (ref 3.5–5.0)
Alkaline Phosphatase: 61 U/L (ref 38–126)
Anion gap: 13 (ref 5–15)
BUN: 20 mg/dL (ref 6–20)
CO2: 22 mmol/L (ref 22–32)
Calcium: 9.5 mg/dL (ref 8.9–10.3)
Chloride: 105 mmol/L (ref 101–111)
Creatinine, Ser: 1.12 mg/dL (ref 0.61–1.24)
GFR calc Af Amer: >60 mL/min (ref >60)
GFR calc non Af Amer: >60 mL/min (ref >60)
Glucose, Bld: 159 mg/dL — ABNORMAL HIGH (ref 65–99)
Potassium: 3.4 mmol/L — ABNORMAL LOW (ref 3.5–5.1)
Sodium: 140 mmol/L (ref 135–145)
Total Bilirubin: 1 mg/dL (ref 0.3–1.2)
Total Protein: 8.3 g/dL — ABNORMAL HIGH (ref 6.5–8.1)

## 2017-06-26 LAB — ETHANOL: Alcohol, Ethyl (B): <10 mg/dL (ref <10)

## 2017-06-26 NOTE — Discharge Instructions (Signed)
Discontinue use of illicit substances.  We recommend that you follow-up with 1 of the treatment facilities listed in the resource guides attached.

## 2017-06-26 NOTE — ED Provider Notes (Signed)
[facility]-EMERGENCY DEPT Provider Note   CSN: [id number] Arrival date & time: 06/25/17  2207     History   Chief Complaint Chief Complaint  Patient presents with  . Addiction Problem    HPI Fran Mcree is a 37 y.o. male.  37 year old male with a history of arthritis, depression, substance-induced mood disorder presents to the emergency department after being dropped off by [employer].  He was found to initiate the parking lot 1 hour after smoking crack cocaine.  He reports using meth over the past few days after relapsing approximately a week ago.  He states that he is "meth guy" cut him off so he bought some crack to smoke today.  He decided to use crack cocaine because he was "not ready to go to bed".  Speech is fairly rapid and pressured with mild hyperactivity, but patient seems to be expressing how he was hallucinating while he was outside of [employer].  He denies any suicidal or homicidal ideations at this time.  The history is provided by the patient. No language interpreter was used.    Past Medical History:  Diagnosis Date  . Anxiety   . Arthritis   . Depression     Patient Active Problem List   Diagnosis Date Noted  . 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 Substance induced mood disorder (HCC) 03/08/2017  . Alcoholism (HCC) 09/12/2014    Past Surgical History:  Procedure Laterality Date  . GALLBLADDER SURGERY          Home Medications    Prior to Admission medications   Medication Sig Start Date End Date Taking? Authorizing Provider  buPROPion (WELLBUTRIN XL) 300 MG 24 hr tablet Take 300 mg by mouth daily. 12/27/16  Yes [provider]  doxycycline (VIBRAMYCIN) 100 MG capsule Take 1 capsule (100 mg total) by mouth 2 (two) times daily. One po bid x 7 days Patient not taking: Reported on 06/25/2017 03/10/17   Mancel Bale, MD    Family History Family History  Problem  Relation Age of Onset  . Diabetes Mother   . Diabetes Maternal Grandfather     Social History Social History   Tobacco Use  . Smoking status: Current Every Day Smoker    Types: Cigarettes  . Smokeless tobacco: Never Used  Substance Use Topics  . Alcohol use: Yes    Alcohol/week: 0.0 oz  . Drug use: Yes    Types: Cocaine, Heroin, Methamphetamines     Allergies   Patient has no known allergies.   Review of Systems Review of Systems Ten systems reviewed and are negative for acute change, except as noted in the HPI.    Physical Exam Updated Vital Signs BP (!) 137/94 (BP Location: Left Arm)   Pulse (!) 109   Temp 98.5 F (36.9 C) (Oral)   Resp 18   Ht  (1.651 m)   Wt 95.3 kg (210 lb)   SpO2 97%   BMI 34.95 kg/m   Physical Exam  Constitutional: He is oriented to person, place, and time. He appears well-developed and well-nourished. No distress.  Eating a sandwich and cheese stick  HENT:  Head: Normocephalic and atraumatic.  Eyes: Conjunctivae and EOM are normal. No scleral icterus.  Neck: Normal range of motion.  Pulmonary/Chest: Effort normal. No respiratory distress.  Respirations even and unlabored  Musculoskeletal: Normal range of motion.  Neurological: He is alert and oriented to person, place, and  time. He exhibits normal muscle tone. Coordination normal.  Skin: Skin is warm and dry. No rash noted. He is not diaphoretic. No erythema. No pallor.  Psychiatric: He has a normal mood and affect. His speech is rapid and/or pressured. He is hyperactive (mild).  Denies SI/HI  Nursing note and vitals reviewed.    ED Treatments / Results  Labs (all labs ordered are listed, but only abnormal results are displayed) Labs Reviewed  COMPREHENSIVE METABOLIC PANEL - Abnormal; Notable for the following components:      Result Value   Potassium 3.4 (*)    Glucose, Bld 159 (*)    Total Protein 8.3 (*)    All other components within normal limits  ETHANOL  CBC      EKG None  Radiology No results found.  Procedures Procedures (including critical care time)  Medications Ordered in ED Medications  cloNIDine (CATAPRES) tablet 0.1 mg (0.1 mg Oral Given 06/26/17 0044)     Initial Impression / Assessment and Plan / ED Course  I have reviewed the triage vital signs and the nursing notes.  Pertinent labs & imaging results that were available during my care of the patient were reviewed by me and considered in my medical decision making (see chart for details).     37 year old male brought to the emergency department by [employer] after he was found high post crack cocaine use in the [place] parking lot.  Patient denies suicidal and homicidal thoughts.  Mild hyperactivity noted, vitals are reassuring.  He was given 1 tablet of clonidine to help with reports of increased anxiety.  I do not see current indication for further emergent work-up or assessment.  Patient given information on outpatient treatment facilities.  Return precautions discussed and provided.  Patient discharged in stable condition with no unaddressed concerns.   Final Clinical Impressions(s) / ED Diagnoses   Final diagnoses:  Polysubstance abuse [facility])    ED Discharge Orders    None       Antony Madura, PA-C 06/26/17 0127    Marily Memos, MD 06/29/17 1530

## 2017-07-04 ENCOUNTER — Emergency Department
Admission: EM | Admit: 2017-07-04 | Discharge: 2017-07-07 | Disposition: A | Discharge status: Home / Self Care | Payer: Self-pay | Attending: Emergency Medicine | Admitting: Emergency Medicine

## 2017-07-04 ENCOUNTER — Other Ambulatory Visit: Payer: Self-pay

## 2017-07-04 ENCOUNTER — Encounter: Payer: Self-pay | Admitting: Emergency Medicine

## 2017-07-04 DIAGNOSIS — F17210 Nicotine dependence, cigarettes, uncomplicated: Secondary | ICD-10-CM | POA: Insufficient documentation

## 2017-07-04 DIAGNOSIS — F1910 Other psychoactive substance abuse, uncomplicated: Secondary | ICD-10-CM | POA: Insufficient documentation

## 2017-07-04 DIAGNOSIS — Z046 Encounter for general psychiatric examination, requested by authority: Secondary | ICD-10-CM | POA: Insufficient documentation

## 2017-07-04 DIAGNOSIS — R451 Restlessness and agitation: Secondary | ICD-10-CM | POA: Insufficient documentation

## 2017-07-04 DIAGNOSIS — Z79899 Other long term (current) drug therapy: Secondary | ICD-10-CM | POA: Insufficient documentation

## 2017-07-04 DIAGNOSIS — F1594 Other stimulant use, unspecified with stimulant-induced mood disorder: Secondary | ICD-10-CM | POA: Diagnosis present

## 2017-07-04 DIAGNOSIS — K5900 Constipation, unspecified: Secondary | ICD-10-CM | POA: Insufficient documentation

## 2017-07-04 DIAGNOSIS — F419 Anxiety disorder, unspecified: Secondary | ICD-10-CM | POA: Insufficient documentation

## 2017-07-04 DIAGNOSIS — R51 Headache: Secondary | ICD-10-CM | POA: Insufficient documentation

## 2017-07-04 DIAGNOSIS — F1320 Sedative, hypnotic or anxiolytic dependence, uncomplicated: Secondary | ICD-10-CM | POA: Diagnosis present

## 2017-07-04 DIAGNOSIS — R45851 Suicidal ideations: Secondary | ICD-10-CM | POA: Insufficient documentation

## 2017-07-04 DIAGNOSIS — F332 Major depressive disorder, recurrent severe without psychotic features: Secondary | ICD-10-CM | POA: Diagnosis present

## 2017-07-04 DIAGNOSIS — F1420 Cocaine dependence, uncomplicated: Secondary | ICD-10-CM | POA: Diagnosis present

## 2017-07-04 DIAGNOSIS — F1410 Cocaine abuse, uncomplicated: Secondary | ICD-10-CM | POA: Insufficient documentation

## 2017-07-04 DIAGNOSIS — F329 Major depressive disorder, single episode, unspecified: Secondary | ICD-10-CM | POA: Insufficient documentation

## 2017-07-04 DIAGNOSIS — M791 Myalgia, unspecified site: Secondary | ICD-10-CM | POA: Insufficient documentation

## 2017-07-04 DIAGNOSIS — R454 Irritability and anger: Secondary | ICD-10-CM | POA: Insufficient documentation

## 2017-07-04 DIAGNOSIS — F1994 Other psychoactive substance use, unspecified with psychoactive substance-induced mood disorder: Secondary | ICD-10-CM | POA: Diagnosis present

## 2017-07-04 LAB — URINE DRUG SCREEN, QUALITATIVE (ARMC ONLY)
Amphetamines, Ur Screen: NOT DETECTED
Barbiturates, Ur Screen: NOT DETECTED
Benzodiazepine, Ur Scrn: POSITIVE — AB
Cannabinoid 50 Ng, Ur ~~LOC~~: NOT DETECTED
Cocaine Metabolite,Ur ~~LOC~~: POSITIVE — AB
MDMA (Ecstasy)Ur Screen: NOT DETECTED
Methadone Scn, Ur: NOT DETECTED
Opiate, Ur Screen: NOT DETECTED
Phencyclidine (PCP) Ur S: NOT DETECTED
Tricyclic, Ur Screen: NOT DETECTED

## 2017-07-04 LAB — CBC
HCT: 41.2 % (ref 40.0–52.0)
Hemoglobin: 14.4 g/dL (ref 13.0–18.0)
MCH: 30.9 pg (ref 26.0–34.0)
MCHC: 34.9 g/dL (ref 32.0–36.0)
MCV: 88.6 fL (ref 80.0–100.0)
Platelets: 246 10*3/uL (ref 150–440)
RBC: 4.65 MIL/uL (ref 4.40–5.90)
RDW: 13.6 % (ref 11.5–14.5)
WBC: 7.7 10*3/uL (ref 3.8–10.6)

## 2017-07-04 LAB — ETHANOL: Alcohol, Ethyl (B): <10 mg/dL (ref <10)

## 2017-07-04 LAB — COMPREHENSIVE METABOLIC PANEL
ALT: 72 U/L — ABNORMAL HIGH (ref 17–63)
AST: 65 U/L — ABNORMAL HIGH (ref 15–41)
Albumin: 4.2 g/dL (ref 3.5–5.0)
Alkaline Phosphatase: 70 U/L (ref 38–126)
Anion gap: 11 (ref 5–15)
BUN: 11 mg/dL (ref 6–20)
CO2: 27 mmol/L (ref 22–32)
Calcium: 9 mg/dL (ref 8.9–10.3)
Chloride: 100 mmol/L — ABNORMAL LOW (ref 101–111)
Creatinine, Ser: 0.89 mg/dL (ref 0.61–1.24)
GFR calc Af Amer: >60 mL/min (ref >60)
GFR calc non Af Amer: >60 mL/min (ref >60)
Glucose, Bld: 115 mg/dL — ABNORMAL HIGH (ref 65–99)
Potassium: 3.5 mmol/L (ref 3.5–5.1)
Sodium: 138 mmol/L (ref 135–145)
Total Bilirubin: 0.5 mg/dL (ref 0.3–1.2)
Total Protein: 7.5 g/dL (ref 6.5–8.1)

## 2017-07-04 LAB — ACETAMINOPHEN LEVEL: Acetaminophen (Tylenol), Serum: <10 ug/mL — ABNORMAL LOW (ref 10–30)

## 2017-07-04 LAB — SALICYLATE LEVEL: Salicylate Lvl: <7 mg/dL (ref 2.8–30.0)

## 2017-07-04 MED ORDER — CALCIUM CARBONATE ANTACID 500 MG PO CHEW
CHEWABLE_TABLET | ORAL | Status: AC
  Administered 2017-07-04: 200 mg via ORAL
  Filled 2017-07-04: qty 1

## 2017-07-04 MED ORDER — CALCIUM CARBONATE ANTACID 500 MG PO CHEW
1.0000 | CHEWABLE_TABLET | Freq: Once | ORAL | Status: AC
  Administered 2017-07-04: 200 mg via ORAL

## 2017-07-04 MED ORDER — ACETAMINOPHEN 500 MG PO TABS
1000.0000 mg | ORAL_TABLET | Freq: Once | ORAL | Status: AC
  Administered 2017-07-04: 1000 mg via ORAL

## 2017-07-04 MED ORDER — ACETAMINOPHEN 500 MG PO TABS
ORAL_TABLET | ORAL | Status: AC
  Filled 2017-07-04: qty 2

## 2017-07-04 MED ORDER — ACETAMINOPHEN 325 MG PO TABS: 650.0000 mg | ORAL_TABLET | Freq: Once | ORAL | Status: DC

## 2017-07-04 NOTE — ED Notes (Signed)
Pt requested something for headache and acid reflux and sprite. Pt given sprite and RN notified of headache and acid reflux.

## 2017-07-04 NOTE — ED Notes (Signed)

## 2017-07-04 NOTE — ED Notes (Signed)
Pt belongings put into a belongings bag. Pts belongings included: pair of pants, one shirt, pair of shoes, black hat, pair of white socks, cell phone, one key and a drivers license.

## 2017-07-04 NOTE — ED Provider Notes (Signed)
[facility] Emergency Department Provider Note  ____________________________________________   First MD Initiated Contact with Patient 07/04/17 1152     (approximate)  I have reviewed the triage vital signs and the nursing notes.   HISTORY  Chief Complaint Suicidal   HPI Robert [name] is a 37 y.o. male a history of depression as well as substance abuse who is presenting to the emergency department today with suicidal ideation.  He states that he is a plan to shoot himself in the head with a gun.  He does not have direct access to gun but he says "anyone can get a gun."  Patient says that he is also been smoking cocaine over the past 24 hours.  He says that his substance abuse as well as pressure from his family has driven him to want to kill himself.  Denies any other drinking or drug use.   Past Medical History:  Diagnosis Date  . Anxiety   . Arthritis   . Depression     Patient Active Problem List   Diagnosis Date Noted  . 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 Substance induced mood disorder (HCC) 03/08/2017  . Alcoholism (HCC) 09/12/2014    Past Surgical History:  Procedure Laterality Date  . GALLBLADDER SURGERY      Prior to Admission medications   Medication Sig Start Date End Date Taking? Authorizing Provider  buPROPion (WELLBUTRIN XL) 300 MG 24 hr tablet Take 300 mg by mouth daily. 12/27/16   [provider]  doxycycline (VIBRAMYCIN) 100 MG capsule Take 1 capsule (100 mg total) by mouth 2 (two) times daily. One po bid x 7 days Patient not taking: Reported on 06/25/2017 03/10/17   Mancel Bale, MD    Allergies Patient has no known allergies.  Family History  Problem Relation Age of Onset  . Diabetes Mother   . Diabetes Maternal Grandfather     Social History Social History   Tobacco Use  . Smoking status: Current Every Day Smoker    Types: Cigarettes  . Smokeless  tobacco: Never Used  Substance Use Topics  . Alcohol use: Yes    Alcohol/week: 0.0 oz  . Drug use: Yes    Types: Cocaine, Heroin, Methamphetamines    Review of Systems  Constitutional: No fever/chills Eyes: No visual changes. ENT: No sore throat. Cardiovascular: Denies chest pain. Respiratory: Denies shortness of breath. Gastrointestinal: No abdominal pain.  No nausea, no vomiting.  No diarrhea.  No constipation. Genitourinary: Negative for dysuria. Musculoskeletal: Negative for back pain. Skin: Negative for rash. Neurological: Negative for headaches, focal weakness or numbness.   ____________________________________________   PHYSICAL EXAM:  VITAL SIGNS: ED Triage Vitals  Enc Vitals Group     BP 07/04/17 1106 128/79     Pulse Rate 07/04/17 1106 88     Resp 07/04/17 1106 20     Temp 07/04/17 1106 98.8 F (37.1 C)     Temp Source 07/04/17 1106 Oral     SpO2 07/04/17 1106 97 %     Weight 07/04/17 1107 210 lb (95.3 kg)     Height 07/04/17 1107  (1.651 m)     Head Circumference --      Peak Flow --      Pain Score 07/04/17 1110 8     Pain Loc --      Pain Edu? --      Excl. in GC? --     Constitutional: Alert  and oriented. Well appearing and in no acute distress. Eyes: Conjunctivae are normal.  Head: Atraumatic. Nose: No congestion/rhinnorhea. Mouth/Throat: Mucous membranes are moist.  Neck: No stridor.   Cardiovascular: Normal rate, regular rhythm. Grossly normal heart sounds.   Respiratory: Normal respiratory effort.  No retractions. Lungs CTAB. Gastrointestinal: Soft and nontender. No distention.  Musculoskeletal: No lower extremity tenderness nor edema.  No joint effusions. Neurologic:  Normal speech and language. No gross focal neurologic deficits are appreciated. Skin:  Skin is warm, dry and intact. No rash noted. Psychiatric: Mood and affect are normal. Speech and behavior are normal.  ____________________________________________   LABS (all  labs ordered are listed, but only abnormal results are displayed)  Labs Reviewed  COMPREHENSIVE METABOLIC PANEL - Abnormal; Notable for the following components:      Result Value   Chloride 100 (*)    Glucose, Bld 115 (*)    AST 65 (*)    ALT 72 (*)    All other components within normal limits  ACETAMINOPHEN LEVEL - Abnormal; Notable for the following components:   Acetaminophen (Tylenol), Serum <10 (*)    All other components within normal limits  ETHANOL  SALICYLATE LEVEL  CBC  URINE DRUG SCREEN, QUALITATIVE ([facility] ONLY)   ____________________________________________  EKG   ____________________________________________  RADIOLOGY   ____________________________________________   PROCEDURES  Procedure(s) performed:   Procedures  Critical Care performed:   ____________________________________________   INITIAL IMPRESSION / ASSESSMENT AND PLAN / ED COURSE  Pertinent labs & imaging results that were available during my care of the patient were reviewed by me and considered in my medical decision making (see chart for details).  DDX: Suicidal ideation, substance abuse, substance abuse mood disorder, cocaine abuse, depression As part of my medical decision making, I reviewed the following data within the electronic MEDICAL RECORD NUMBER Notes from prior ED visits  Patient placed under involuntary commitment.  Pending TTS as well as psychiatric consultation.  Patient aware of commitment status and cooperative. ____________________________________________   FINAL CLINICAL IMPRESSION(S) / ED DIAGNOSES  Substance abuse.  Suicidal ideation.    NEW MEDICATIONS STARTED DURING THIS VISIT:  New Prescriptions   No medications on file     Note:  This document was prepared using Dragon voice recognition software and may include unintentional dictation errors.     Myrna Blazer, MD 07/04/17 [phone]

## 2017-07-04 NOTE — ED Notes (Signed)
Pt given lunch tray.

## 2017-07-04 NOTE — ED Notes (Signed)
Pt given graham crackers and white milk to try and help settle the acid reflux.

## 2017-07-04 NOTE — ED Notes (Signed)
IV[facility] soc called

## 2017-07-04 NOTE — ED Notes (Signed)
BEHAVIORAL HEALTH ROUNDING Patient sleeping: Yes.   Patient alert and oriented: eyes closed  Appears to be asleep Behavior appropriate: Yes.  ; If no, describe:  Nutrition and fluids offered: Yes  Toileting and hygiene offered: sleeping Sitter present: q 15 minute observations and security monitoring Law enforcement present: yes   

## 2017-07-04 NOTE — BH Assessment (Signed)
Assessment Note  Robert [name] is an 37 y.o. male who presents to the ER due to having thoughts of ending his life. He states he is trying to stop abusing drugs and because he can't he want to die. He reports of going to multiple treatment facilities, which includes [facility] and several [facility]. Due to his ongoing use, he was "kicked out" of his recent [place] house. Patient have been homeless for approximately two weeks. Patient reports of having no plan of how he would end his life. He states his past suicide attempts were, "me using too much drugs. That's me trying to kill myself."  During the interview, the patient was calm, cooperative and pleasant. He denies AV/H and HI. He also denies involvement with the legal system and DSS.  Diagnosis: Depression & Substance Use Disorder  Past Medical History:  Past Medical History:  Diagnosis Date  . Anxiety   . Arthritis   . Depression     Past Surgical History:  Procedure Laterality Date  . GALLBLADDER SURGERY      Family History:  Family History  Problem Relation Age of Onset  . Diabetes Mother   . Diabetes Maternal Grandfather     Social History:  reports that he has been smoking cigarettes.  He has never used smokeless tobacco. He reports that he drinks alcohol. He reports that he has current or past drug history. Drugs: Cocaine, Heroin, and Methamphetamines.  Additional Social History:  Alcohol / Drug Use Pain Medications: See PTA Prescriptions: See PTA Over the Counter: See PTA History of alcohol / drug use?: Yes Longest period of sobriety (when/how long): 18 mos (began Oct 2015) most recent peroid of sobriety was 15 mos Negative Consequences of Use: Surveyor, quantity, Armed forces operational officer, Personal relationships, Work / Programmer, multimedia Withdrawal Symptoms: Agitation, Tingling Substance #1 Name of Substance 1: Alcohol Substance #2 Name of Substance 2: Cocaine 2 - Age of First Use: 16 2 - Amount (size/oz): "As much I can get" 2 -  Frequency: Three days out of the week 2 - Duration: "Since I was 37 years old" 2 - Last Use / Amount: 07/04/2017 Substance #3 Name of Substance 3: Methamphetamine 3 - Age of First Use: 36 3 - Amount (size/oz): "A few grams" 3 - Frequency: "About three days a week" 3 - Duration: Since the age of 8 3 - Last Use / Amount: "A week ago (06/27/2017)  CIWA: CIWA-Ar BP: 128/79 Pulse Rate: 88 COWS:    Allergies: No Known Allergies  Home Medications:  (Not in a hospital admission)  OB/GYN Status:  No LMP for male patient.  General Assessment Data Location of Assessment: [facility] ED TTS Assessment: In system Is this a Tele or Face-to-Face Assessment?: Face-to-Face Is this an Initial Assessment or a Re-assessment for this encounter?: Initial Assessment Marital status: Single Maiden name: n/a Is patient pregnant?: No Pregnancy Status: No Living Arrangements: Other (Comment)(Homeless) Can pt return to current living arrangement?: Yes Admission Status: Involuntary Is patient capable of signing voluntary admission?: No(Under IVC) Referral Source: Self/Family/Friend Insurance type: None  Medical Screening Exam [facility] Walk-in ONLY) Medical Exam completed: Yes  Crisis Care Plan Living Arrangements: Other (Comment)(Homeless) Legal Guardian: Other:(Self) Name of Psychiatrist: Reports of none Name of Therapist: Reports of none  Education Status Is patient currently in school?: No Is the patient employed, unemployed or receiving disability?: Unemployed  Risk to self with the past 6 months Suicidal Ideation: Yes-Currently Present Has patient been a risk to self within the past 6  months prior to admission? : Yes Suicidal Intent: No Has patient had any suicidal intent within the past 6 months prior to admission? : No Is patient at risk for suicide?: No Suicidal Plan?: No-Not Currently/Within Last 6 Months Has patient had any suicidal plan within the past 6 months prior to admission? :  No Access to Means: No What has been your use of drugs/alcohol within the last 12 months?: Alcohol, Cocaine & Methamphetamine Previous Attempts/Gestures: No How many times?: 0 Other Self Harm Risks: Active Addiction Triggers for Past Attempts: Other (Comment)(Active Addiction) Intentional Self Injurious Behavior: None Family Suicide History: No Recent stressful life event(s): Other (Comment), Loss (Comment), Financial Problems(Active Addiction) Persecutory voices/beliefs?: No Depression: Yes Depression Symptoms: Tearfulness, Isolating, Feeling worthless/self pity, Guilt, Feeling angry/irritable Substance abuse history and/or treatment for substance abuse?: Yes Suicide prevention information given to non-admitted patients: Not applicable  Risk to Others within the past 6 months Homicidal Ideation: No Does patient have any lifetime risk of violence toward others beyond the six months prior to admission? : No Thoughts of Harm to Others: No Current Homicidal Intent: No Current Homicidal Plan: No Access to Homicidal Means: No Identified Victim: Reports of none History of harm to others?: No Assessment of Violence: None Noted Violent Behavior Description: Active Addiction Does patient have access to weapons?: No Criminal Charges Pending?: No Does patient have a court date: No Is patient on probation?: No  Psychosis Hallucinations: None noted Delusions: None noted  Mental Status Report Appearance/Hygiene: Unremarkable, In scrubs Eye Contact: Poor Motor Activity: Unable to assess(Patient laying in the bed) Speech: Unremarkable, Logical/coherent Level of Consciousness: Quiet/awake Mood: Depressed, Sad, Pleasant Affect: Appropriate to circumstance, Depressed, Sad Anxiety Level: Minimal Thought Processes: Coherent, Relevant Judgement: Unimpaired Orientation: Person, Place, Time, Situation, Appropriate for developmental age Obsessive Compulsive Thoughts/Behaviors:  Minimal  Cognitive Functioning Concentration: Normal Memory: Recent Intact, Remote Intact Is patient IDD: No Is patient DD?: No Insight: Fair Impulse Control: Fair Appetite: Good Have you had any weight changes? : No Change Sleep: No Change Total Hours of Sleep: 8 Vegetative Symptoms: None  ADLScreening [facility] Assessment Services) Patient's cognitive ability adequate to safely complete daily activities?: Yes Patient able to express need for assistance with ADLs?: Yes Independently performs ADLs?: Yes (appropriate for developmental age)  Prior Inpatient Therapy Prior Inpatient Therapy: Yes Prior Therapy Dates: Unable to remember date Prior Therapy Facilty/Provider(s): [facility] Reason for Treatment: Substance Use  Prior Outpatient Therapy Prior Outpatient Therapy: No Does patient have an ACCT team?: No Does patient have Intensive In-House Services?  : No Does patient have [facility] services? : No Does patient have P4CC services?: No  ADL Screening (condition at time of admission) Patient's cognitive ability adequate to safely complete daily activities?: Yes Is the patient deaf or have difficulty hearing?: No Does the patient have difficulty seeing, even when wearing glasses/contacts?: No Does the patient have difficulty concentrating, remembering, or making decisions?: No Patient able to express need for assistance with ADLs?: Yes Does the patient have difficulty dressing or bathing?: No Independently performs ADLs?: Yes (appropriate for developmental age) Does the patient have difficulty walking or climbing stairs?: No Weakness of Legs: None Weakness of Arms/Hands: None  Home Assistive Devices/Equipment Home Assistive Devices/Equipment: None  Therapy Consults (therapy consults require a physician order) PT Evaluation Needed: No OT Evalulation Needed: No SLP Evaluation Needed: No Abuse/Neglect Assessment (Assessment to be complete while patient is  alone) Abuse/Neglect Assessment Can Be Completed: Yes Physical Abuse: Denies Verbal Abuse: Denies Sexual Abuse:  Denies Exploitation of patient/patient's resources: Denies Self-Neglect: Denies Values / Beliefs Cultural Requests During Hospitalization: None Spiritual Requests During Hospitalization: None Consults Spiritual Care Consult Needed: No Social Work Consult Needed: No         Child/Adolescent Assessment Running Away Risk: Denies(Patient is an adult)  Disposition:  Disposition Initial Assessment Completed for this Encounter: Yes  On Site Evaluation by:   Reviewed with Physician:    Lilyan Gilford MS, LCAS, LPC, NCC, CCSI Therapeutic Triage Specialist 07/04/2017 9:05 PM

## 2017-07-04 NOTE — ED Notes (Signed)
First Nurse Note:  Patient presented to registration stating he was having thoughts of hurting himself.  Placed inside triage hallway under direct observation.

## 2017-07-04 NOTE — ED Notes (Signed)
Pt asking to take a shower. Pt informed by this tech that I do not have time before shift change to start and stop the shower, therefore I would inform whoever comes in at seven that pt would like to take a shower.

## 2017-07-04 NOTE — ED Notes (Signed)
BEHAVIORAL HEALTH ROUNDING Patient sleeping: No. Patient alert and oriented: yes Behavior appropriate: Yes.  ; If no, describe:  Nutrition and fluids offered: yes Toileting and hygiene offered: Yes  Sitter present: q15 minute observations and security monitoring Law enforcement present: Yes    

## 2017-07-04 NOTE — ED Triage Notes (Signed)
Pt to ED via POV, pt states that he is having SI. Pt denies plan but states that "it wouldn't be too hard". Pt admits to using 7-8 grams of cocaine over the last 24 hours. Pt denies ETOH. Pt is calm and cooperative in triage at this time.

## 2017-07-04 NOTE — ED Notes (Signed)
ED BHU PLACEMENT JUSTIFICATION Is the patient under IVC or is there intent for IVC: Yes.   Is the patient medically cleared: Yes.   Is there vacancy in the ED BHU: Yes.   Is the population mix appropriate for patient: Yes.   Is the patient awaiting placement in inpatient or outpatient setting: Yes.   Has the patient had a psychiatric consult: Yes.   Survey of unit performed for contraband, proper placement and condition of furniture, tampering with fixtures in bathroom, shower, and each patient room: Yes.   APPEARANCE/BEHAVIOR adequate rapport can be established NEURO ASSESSMENT Orientation: time, place and person Hallucinations: No.None noted (Hallucinations) Speech: Normal Gait: normal RESPIRATORY ASSESSMENT Normal expansion.  Clear to auscultation.  No rales, rhonchi, or wheezing. CARDIOVASCULAR ASSESSMENT regular rate and rhythm, S1, S2 normal, no murmur, click, rub or gallop GASTROINTESTINAL ASSESSMENT soft, nontender, BS WNL, no r/g EXTREMITIES normal strength, tone, and muscle mass PLAN OF CARE Provide calm/safe environment. Vital signs assessed twice daily. ED BHU Assessment once each 12-hour shift. Collaborate with intake RN daily or as condition indicates. Assure the ED provider has rounded once each shift. Provide and encourage hygiene. Provide redirection as needed. Assess for escalating behavior; address immediately and inform ED provider.  Assess family dynamic and appropriateness for visitation as needed: Yes.   Educate the patient/family about BHU procedures/visitation: Yes.   

## 2017-07-04 NOTE — ED Notes (Signed)
Patient received PM snack. 

## 2017-07-05 MED ORDER — LORAZEPAM 1 MG PO TABS
1.0000 mg | ORAL_TABLET | Freq: Once | ORAL | Status: AC
  Administered 2017-07-05: 1 mg via ORAL
  Filled 2017-07-05: qty 1

## 2017-07-05 MED ORDER — TRAZODONE HCL 100 MG PO TABS
100.0000 mg | ORAL_TABLET | Freq: Every evening | ORAL | Status: DC | PRN
  Administered 2017-07-05 – 2017-07-06 (×2): 100 mg via ORAL
  Filled 2017-07-05 (×2): qty 1

## 2017-07-05 MED ORDER — NICOTINE 21 MG/24HR TD PT24
21.0000 mg | MEDICATED_PATCH | Freq: Once | TRANSDERMAL | Status: AC
Start: 2017-07-05 — End: 2017-07-06
  Administered 2017-07-05: 21 mg via TRANSDERMAL
  Filled 2017-07-05: qty 1

## 2017-07-05 MED ORDER — HYDROXYZINE HCL 25 MG PO TABS
50.0000 mg | ORAL_TABLET | Freq: Three times a day (TID) | ORAL | Status: DC | PRN
  Administered 2017-07-05 – 2017-07-06 (×4): 50 mg via ORAL
  Filled 2017-07-05 (×4): qty 2

## 2017-07-05 NOTE — BH Assessment (Addendum)
Referral sent to the following:  . [facility]   [address] [place] [address]  [phone]  . [facility]   [address] [place] [address]  [phone]  . [facility]  [address] [place] [address]  [phone]  . [facility]   [address], [place] [address]  [phone]  . [facility]   [address], [place] [address]  [phone]

## 2017-07-05 NOTE — ED Notes (Signed)
ED BHU PLACEMENT JUSTIFICATION Is the patient under IVC or is there intent for IVC: Yes.   Is the patient medically cleared: Yes.   Is there vacancy in the ED BHU: Yes.   Is the population mix appropriate for patient: Yes.   Is the patient awaiting placement in inpatient or outpatient setting: Yes.   Has the patient had a psychiatric consult: Yes.   Survey of unit performed for contraband, proper placement and condition of furniture, tampering with fixtures in bathroom, shower, and each patient room: Yes.   APPEARANCE/BEHAVIOR calm and adequate rapport can be established NEURO ASSESSMENT Orientation: time, place and person Hallucinations: No.None noted (Hallucinations) Speech: Normal Gait: normal RESPIRATORY ASSESSMENT Normal expansion.  Clear to auscultation.  No rales, rhonchi, or wheezing. CARDIOVASCULAR ASSESSMENT regular rate and rhythm, S1, S2 normal, no murmur, click, rub or gallop GASTROINTESTINAL ASSESSMENT soft, nontender, BS WNL, no r/g EXTREMITIES normal strength, tone, and muscle mass PLAN OF CARE Provide calm/safe environment. Vital signs assessed twice daily. ED BHU Assessment once each 12-hour shift. Collaborate with intake RN daily or as condition indicates. Assure the ED provider has rounded once each shift. Provide and encourage hygiene. Provide redirection as needed. Assess for escalating behavior; address immediately and inform ED provider.  Assess family dynamic and appropriateness for visitation as needed: Yes.   Educate the patient/family about BHU procedures/visitation: Yes.   

## 2017-07-05 NOTE — ED Notes (Signed)
Patient is alert and oriented x 4.  Presents with irritable affect and mood.  Denies suicidal thoughts, auditory and visual hallucinations.  Routine safety checks maintained every 15 minutes.  Prn Vistaril 50 mg given for complain of anxiety with poor effect.  Patient states medication not strong enough for him.  Continues to be irritable and demanding on the unit.  Offered support and encouragement as needed.  Patient is safe on the unit.

## 2017-07-05 NOTE — ED Provider Notes (Signed)
-----------------------------------------   2:42 PM on 07/05/2017 -----------------------------------------   Blood pressure 128/79, pulse 88, temperature 98.8 F (37.1 C), temperature source Oral, resp. rate 20, height  (1.651 m), weight 95.3 kg (210 lb), SpO2 100 %.  The patient had no acute events since last update.  Calm and cooperative at this time.  Patient requesting medication for anxiety.  I have written for as needed hydroxyzine.     Minna Antis, MD 07/05/17 1442

## 2017-07-05 NOTE — ED Notes (Signed)
Watching TV laying calmly on bed

## 2017-07-05 NOTE — ED Notes (Signed)
Patient received PM snack. 

## 2017-07-06 MED ORDER — IBUPROFEN 600 MG PO TABS
600.0000 mg | ORAL_TABLET | Freq: Once | ORAL | Status: AC
  Administered 2017-07-06: 600 mg via ORAL
  Filled 2017-07-06: qty 1

## 2017-07-06 MED ORDER — NICOTINE 21 MG/24HR TD PT24
21.0000 mg | MEDICATED_PATCH | Freq: Once | TRANSDERMAL | Status: AC
  Administered 2017-07-06: 21 mg via TRANSDERMAL
  Filled 2017-07-06: qty 1

## 2017-07-06 NOTE — ED Provider Notes (Signed)
-----------------------------------------   2:10 AM on 07/06/2017 -----------------------------------------   Blood pressure 128/79, pulse 88, temperature 98.8 F (37.1 C), temperature source Oral, resp. rate 20, height  (1.651 m), weight 95.3 kg (210 lb), SpO2 100 %.  The patient had no acute events since last update.  Calm and cooperative at this time.  Disposition is pending Psychiatry/Behavioral Medicine team recommendations.     Merrily Brittle, MD 07/06/17 [phone]

## 2017-07-06 NOTE — BH Assessment (Signed)
Per [place] @ [facility] [phone]) patient denied due to no insurance.

## 2017-07-06 NOTE — ED Notes (Signed)
Patient received PM snack. 

## 2017-07-06 NOTE — ED Notes (Signed)
Dinner meal given to patient. 

## 2017-07-06 NOTE — BH Assessment (Addendum)
Referrals sent to the following:  [facility] - Per Jasmine December, patient will be reviewed and will give a call back.   [address] [place] [address]  [phone]  [facility]- Per Candee Furbish, call back, system isn't working.   [address] [place] [address]  [phone]  [facility]- Unable to speak with anyone.    [address] [place] [address]  [phone]   [facility]- Per Tinnie Gens, no beds available.    [address] [place], [place] [address]  [phone]  [facility]- Per Irish Lack, resubmit fax because they review referrals daily, confirmed fax as [phone]  [address], [place] [address]  [phone]

## 2017-07-06 NOTE — ED Notes (Signed)
Patient observed in room alert and verbal. Patient immediately confronts this Clinical research associate with a list of complaints. Patient states he is her for SI thoughts. He denies HI and A/V hallucinations. Patient is wanting to be admitted inpatient for treatment. Patient states he is still having SI thoughts but contracts for safety. Patient provided support and encouragement. Q 15 minute checks in progress and patient remains safe on unit. Monitoring continues.

## 2017-07-07 ENCOUNTER — Encounter: Payer: Self-pay | Admitting: Psychiatry

## 2017-07-07 DIAGNOSIS — F332 Major depressive disorder, recurrent severe without psychotic features: Secondary | ICD-10-CM

## 2017-07-07 DIAGNOSIS — F1320 Sedative, hypnotic or anxiolytic dependence, uncomplicated: Secondary | ICD-10-CM | POA: Diagnosis present

## 2017-07-07 DIAGNOSIS — F1420 Cocaine dependence, uncomplicated: Secondary | ICD-10-CM | POA: Diagnosis present

## 2017-07-07 MED ORDER — LORAZEPAM 2 MG PO TABS
2.0000 mg | ORAL_TABLET | Freq: Once | ORAL | Status: AC
  Administered 2017-07-07: 2 mg via ORAL
  Filled 2017-07-07: qty 1

## 2017-07-07 MED ORDER — CHLORDIAZEPOXIDE HCL 25 MG PO CAPS
25.0000 mg | ORAL_CAPSULE | Freq: Four times a day (QID) | ORAL | Status: DC
  Administered 2017-07-07: 25 mg via ORAL
  Filled 2017-07-07: qty 1

## 2017-07-07 MED ORDER — NICOTINE 21 MG/24HR TD PT24
21.0000 mg | MEDICATED_PATCH | Freq: Every morning | TRANSDERMAL | Status: DC
  Administered 2017-07-07: 21 mg via TRANSDERMAL
  Filled 2017-07-07: qty 1

## 2017-07-07 MED ORDER — QUETIAPINE FUMARATE 25 MG PO TABS: 100.0000 mg | ORAL_TABLET | Freq: Every day | ORAL | Status: DC

## 2017-07-07 MED ORDER — DULOXETINE HCL 30 MG PO CPEP: 30.0000 mg | ORAL_CAPSULE | Freq: Every day | ORAL | 3 refills | Status: DC

## 2017-07-07 MED ORDER — POLYETHYLENE GLYCOL 3350 17 G PO PACK
17.0000 g | PACK | Freq: Every day | ORAL | Status: DC | PRN
  Administered 2017-07-07: 17 g via ORAL
  Filled 2017-07-07: qty 1

## 2017-07-07 MED ORDER — DULOXETINE HCL 30 MG PO CPEP
30.0000 mg | ORAL_CAPSULE | Freq: Every day | ORAL | Status: DC
  Filled 2017-07-07: qty 1

## 2017-07-07 MED ORDER — ACETAMINOPHEN 325 MG PO TABS
650.0000 mg | ORAL_TABLET | Freq: Four times a day (QID) | ORAL | Status: DC | PRN
  Administered 2017-07-07: 650 mg via ORAL
  Filled 2017-07-07: qty 2

## 2017-07-07 NOTE — BH Assessment (Signed)
Per the request of the Psych MD (Dr. Cynda Acres) writer spoke with the patient's uncle ([phone]) to confirmed he does have a job available for him and that the patient is able to live with him. Per the uncle, the patient started working for him at the age of 12, on and off. Patient currently have his belongings at his home and he is able to live with him, upon discharging form the ER.  Uncle further reports he is able to pick the patient up from the ER if need be but it will have to be after he gets off from work. Uncle asked if the patient can drive but Clinical research associate asked he may have to pick him up, per the request of the MD. Kateri Mc agreed to do so if need be.  Writer updated Psych MD (Dr. Lenon Curt).
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
## 2017-07-07 NOTE — ED Notes (Signed)
Patient told nurse that he would like to leave and go back home because His uncle is going to let him work with him and make the money to pay His bills and save money to move to [place]. Patient with brighter attitude and and states that a lot of His anxiety and depression had come from the fact that He did not have a job and that he feels as if He has a reason to continue on in life now. Nurse talked to him about His drug use and He admitted that he was a addict and wanted to try celebrate recovery vs NA, Nurse called TTS and let him know that Patient had a plan and was feeling more stable.

## 2017-07-07 NOTE — BH Assessment (Signed)
14:30-[facility] Referral Form completed for [facility]  14:35-Information faxed to [employer] for Auth #.  17:35-Received phone call from [employer] ([id number]) with  Authorization/Tracking Number [id number]).  Due to patient discharging from ER, the remaining process for [facility] was not completed.

## 2017-07-07 NOTE — Consult Note (Signed)
Mr. Mester is still suicidal. He agrees to rehab.   1. Referral to [facility].  2. Start Librium for benzo withdrawal.  3. Start Seroquel for mood stabilization. Refuses antidepressants.  4. Full consult to follow.

## 2017-07-07 NOTE — ED Provider Notes (Signed)
discussed with Dr. Jennet Maduro after her evaluation. Feels the patient is psychiatrically stable at this time, we discharge home. Dr. Demetrius Charity. Reversing IVC and facilitating follow up..  Final diagnoses:  Suicidal ideation  Cocaine abuse (HCC)  Polysubstance abuse [facility])      Sharman Cheek, MD 07/07/17 1911

## 2017-07-07 NOTE — ED Notes (Addendum)
Patient took shower, no behavioral issues, will continue to monitor. Nurse did talk with patient briefly and He states " I have not had any help and I am still suicidal" He complained of head ache and constipation, body aches and runny nose,  and nurse received order for tylenol and miralax, nurse will administer when pharmacy verifies. Patient is safe, q 15 minute checks and camera surveillance in progress.

## 2017-07-07 NOTE — ED Notes (Signed)
Patient with increased anxiety, agitation, nervousness noted , ED , Dr. Lenna Gilford antianxiety medication one time dose.

## 2017-07-07 NOTE — ED Notes (Signed)
Nurse talked to Patient's uncle and He confirmed that He was giving Patient a job. Patient is calm and cooperative.

## 2017-07-07 NOTE — ED Notes (Signed)
Report to include Situation, Background, Assessment, and Recommendations received from Wendy RN. Patient alert and oriented, warm and dry, in no acute distress. Patient denies SI, HI, AVH and pain. Patient made aware of Q15 minute rounds and security cameras for their safety. Patient instructed to come to me with needs or concerns.  

## 2017-07-07 NOTE — ED Notes (Signed)
Patient is talking on the phone.

## 2017-07-07 NOTE — ED Provider Notes (Signed)
-----------------------------------------   7:02 AM on 07/07/2017 -----------------------------------------   Blood pressure 118/67, pulse 92, temperature 97.9 F (36.6 C), temperature source Oral, resp. rate 20, height  (1.651 m), weight 95.3 kg (210 lb), SpO2 98 %.  The patient had no acute events since last update.  Calm and cooperative at this time.  Disposition is pending Psychiatry/Behavioral Medicine team recommendations.     Irean Hong, MD 07/07/17 [phone]

## 2018-02-28 ENCOUNTER — Encounter (HOSPITAL_COMMUNITY): Payer: Self-pay | Admitting: Emergency Medicine

## 2018-02-28 ENCOUNTER — Other Ambulatory Visit: Payer: Self-pay

## 2018-02-28 ENCOUNTER — Inpatient Hospital Stay (HOSPITAL_COMMUNITY): Payer: Self-pay

## 2018-02-28 ENCOUNTER — Inpatient Hospital Stay (HOSPITAL_COMMUNITY)
Admission: EM | Admit: 2018-02-28 | Discharge: 2018-03-04 | DRG: 603 | Disposition: A | Discharge status: Home / Self Care | Payer: Self-pay | Attending: Oncology | Admitting: Oncology

## 2018-02-28 DIAGNOSIS — F419 Anxiety disorder, unspecified: Secondary | ICD-10-CM | POA: Diagnosis present

## 2018-02-28 DIAGNOSIS — L02419 Cutaneous abscess of limb, unspecified: Secondary | ICD-10-CM | POA: Diagnosis present

## 2018-02-28 DIAGNOSIS — F1594 Other stimulant use, unspecified with stimulant-induced mood disorder: Secondary | ICD-10-CM | POA: Diagnosis present

## 2018-02-28 DIAGNOSIS — L03119 Cellulitis of unspecified part of limb: Secondary | ICD-10-CM

## 2018-02-28 DIAGNOSIS — X58XXXA Exposure to other specified factors, initial encounter: Secondary | ICD-10-CM | POA: Diagnosis present

## 2018-02-28 DIAGNOSIS — Z872 Personal history of diseases of the skin and subcutaneous tissue: Secondary | ICD-10-CM

## 2018-02-28 DIAGNOSIS — F1320 Sedative, hypnotic or anxiolytic dependence, uncomplicated: Secondary | ICD-10-CM | POA: Diagnosis present

## 2018-02-28 DIAGNOSIS — Z885 Allergy status to narcotic agent status: Secondary | ICD-10-CM

## 2018-02-28 DIAGNOSIS — F17210 Nicotine dependence, cigarettes, uncomplicated: Secondary | ICD-10-CM | POA: Diagnosis present

## 2018-02-28 DIAGNOSIS — F1110 Opioid abuse, uncomplicated: Secondary | ICD-10-CM

## 2018-02-28 DIAGNOSIS — L03115 Cellulitis of right lower limb: Secondary | ICD-10-CM | POA: Diagnosis present

## 2018-02-28 DIAGNOSIS — T3696XA Underdosing of unspecified systemic antibiotic, initial encounter: Secondary | ICD-10-CM | POA: Diagnosis present

## 2018-02-28 DIAGNOSIS — F1324 Sedative, hypnotic or anxiolytic dependence with sedative, hypnotic or anxiolytic-induced mood disorder: Secondary | ICD-10-CM | POA: Diagnosis present

## 2018-02-28 DIAGNOSIS — Z79899 Other long term (current) drug therapy: Secondary | ICD-10-CM

## 2018-02-28 DIAGNOSIS — L02414 Cutaneous abscess of left upper limb: Secondary | ICD-10-CM | POA: Diagnosis present

## 2018-02-28 DIAGNOSIS — F1510 Other stimulant abuse, uncomplicated: Secondary | ICD-10-CM

## 2018-02-28 DIAGNOSIS — E876 Hypokalemia: Secondary | ICD-10-CM | POA: Diagnosis present

## 2018-02-28 DIAGNOSIS — F19939 Other psychoactive substance use, unspecified with withdrawal, unspecified: Secondary | ICD-10-CM | POA: Diagnosis not present

## 2018-02-28 DIAGNOSIS — Z91128 Patient's intentional underdosing of medication regimen for other reason: Secondary | ICD-10-CM

## 2018-02-28 DIAGNOSIS — R339 Retention of urine, unspecified: Secondary | ICD-10-CM | POA: Diagnosis present

## 2018-02-28 DIAGNOSIS — F1410 Cocaine abuse, uncomplicated: Secondary | ICD-10-CM

## 2018-02-28 DIAGNOSIS — F1994 Other psychoactive substance use, unspecified with psychoactive substance-induced mood disorder: Secondary | ICD-10-CM | POA: Diagnosis present

## 2018-02-28 DIAGNOSIS — L608 Other nail disorders: Secondary | ICD-10-CM | POA: Diagnosis present

## 2018-02-28 DIAGNOSIS — L02413 Cutaneous abscess of right upper limb: Secondary | ICD-10-CM | POA: Diagnosis present

## 2018-02-28 DIAGNOSIS — F1490 Cocaine use, unspecified, uncomplicated: Secondary | ICD-10-CM | POA: Diagnosis present

## 2018-02-28 DIAGNOSIS — L03113 Cellulitis of right upper limb: Secondary | ICD-10-CM | POA: Diagnosis present

## 2018-02-28 DIAGNOSIS — L0390 Cellulitis, unspecified: Secondary | ICD-10-CM

## 2018-02-28 DIAGNOSIS — L03114 Cellulitis of left upper limb: Secondary | ICD-10-CM | POA: Diagnosis present

## 2018-02-28 DIAGNOSIS — F1190 Opioid use, unspecified, uncomplicated: Secondary | ICD-10-CM | POA: Diagnosis present

## 2018-02-28 DIAGNOSIS — E871 Hypo-osmolality and hyponatremia: Secondary | ICD-10-CM | POA: Diagnosis present

## 2018-02-28 DIAGNOSIS — F329 Major depressive disorder, single episode, unspecified: Secondary | ICD-10-CM | POA: Diagnosis present

## 2018-02-28 DIAGNOSIS — L03116 Cellulitis of left lower limb: Principal | ICD-10-CM | POA: Diagnosis present

## 2018-02-28 DIAGNOSIS — F339 Major depressive disorder, recurrent, unspecified: Secondary | ICD-10-CM

## 2018-02-28 DIAGNOSIS — R21 Rash and other nonspecific skin eruption: Secondary | ICD-10-CM | POA: Diagnosis present

## 2018-02-28 DIAGNOSIS — Z720 Tobacco use: Secondary | ICD-10-CM

## 2018-02-28 DIAGNOSIS — Z833 Family history of diabetes mellitus: Secondary | ICD-10-CM

## 2018-02-28 HISTORY — DX: Cellulitis of unspecified part of limb: L03.119

## 2018-02-28 HISTORY — DX: Cutaneous abscess of limb, unspecified: L02.419

## 2018-02-28 LAB — CBC WITH DIFFERENTIAL/PLATELET
Abs Immature Granulocytes: 0.07 10*3/uL (ref 0.00–0.07)
Basophils Absolute: 0.1 10*3/uL (ref 0.0–0.1)
Basophils Relative: 0 %
Eosinophils Absolute: 0.1 10*3/uL (ref 0.0–0.5)
Eosinophils Relative: 0 %
HCT: 38.3 % — ABNORMAL LOW (ref 39.0–52.0)
Hemoglobin: 13.1 g/dL (ref 13.0–17.0)
Immature Granulocytes: 1 %
Lymphocytes Relative: 14 %
Lymphs Abs: 2 10*3/uL (ref 0.7–4.0)
MCH: 30.5 pg (ref 26.0–34.0)
MCHC: 34.2 g/dL (ref 30.0–36.0)
MCV: 89.1 fL (ref 80.0–100.0)
Monocytes Absolute: 0.9 10*3/uL (ref 0.1–1.0)
Monocytes Relative: 6 %
Neutro Abs: 11.2 10*3/uL — ABNORMAL HIGH (ref 1.7–7.7)
Neutrophils Relative %: 79 %
Platelets: 333 10*3/uL (ref 150–400)
RBC: 4.3 MIL/uL (ref 4.22–5.81)
RDW: 12.4 % (ref 11.5–15.5)
WBC: 14.2 10*3/uL — ABNORMAL HIGH (ref 4.0–10.5)
nRBC: 0 % (ref 0.0–0.2)

## 2018-02-28 LAB — COMPREHENSIVE METABOLIC PANEL
ALT: 30 U/L (ref 0–44)
AST: 33 U/L (ref 15–41)
Albumin: 3.4 g/dL — ABNORMAL LOW (ref 3.5–5.0)
Alkaline Phosphatase: 75 U/L (ref 38–126)
Anion gap: 12 (ref 5–15)
BUN: 10 mg/dL (ref 6–20)
CO2: 24 mmol/L (ref 22–32)
Calcium: 8.9 mg/dL (ref 8.9–10.3)
Chloride: 96 mmol/L — ABNORMAL LOW (ref 98–111)
Creatinine, Ser: 0.84 mg/dL (ref 0.61–1.24)
GFR calc Af Amer: >60 mL/min (ref >60)
GFR calc non Af Amer: >60 mL/min (ref >60)
Glucose, Bld: 137 mg/dL — ABNORMAL HIGH (ref 70–99)
Potassium: 3.3 mmol/L — ABNORMAL LOW (ref 3.5–5.1)
Sodium: 132 mmol/L — ABNORMAL LOW (ref 135–145)
Total Bilirubin: 0.7 mg/dL (ref 0.3–1.2)
Total Protein: 7.4 g/dL (ref 6.5–8.1)

## 2018-02-28 MED ORDER — PIPERACILLIN-TAZOBACTAM 3.375 G IVPB 30 MIN
3.3750 g | Freq: Once | INTRAVENOUS | Status: AC
  Administered 2018-02-28: 3.375 g via INTRAVENOUS
  Filled 2018-02-28: qty 50

## 2018-02-28 MED ORDER — ONDANSETRON HCL 4 MG/2ML IJ SOLN: 4.0000 mg | Freq: Four times a day (QID) | INTRAMUSCULAR | Status: DC | PRN

## 2018-02-28 MED ORDER — POLYETHYLENE GLYCOL 3350 17 G PO PACK
17.0000 g | PACK | Freq: Every day | ORAL | Status: DC
  Administered 2018-03-04: 17 g via ORAL
  Filled 2018-02-28 (×2): qty 1

## 2018-02-28 MED ORDER — VANCOMYCIN HCL IN DEXTROSE 1-5 GM/200ML-% IV SOLN
1000.0000 mg | Freq: Two times a day (BID) | INTRAVENOUS | Status: DC
  Administered 2018-03-01 – 2018-03-04 (×6): 1000 mg via INTRAVENOUS
  Filled 2018-02-28 (×8): qty 200

## 2018-02-28 MED ORDER — ADULT MULTIVITAMIN W/MINERALS CH
1.0000 | ORAL_TABLET | Freq: Every day | ORAL | Status: DC
  Administered 2018-03-01: 1 via ORAL
  Filled 2018-02-28: qty 1

## 2018-02-28 MED ORDER — SODIUM CHLORIDE 0.9 % IV SOLN
INTRAVENOUS | Status: DC | PRN
  Administered 2018-02-28 (×2): 500 mL via INTRAVENOUS

## 2018-02-28 MED ORDER — CEFAZOLIN SODIUM-DEXTROSE 1-4 GM/50ML-% IV SOLN
1.0000 g | Freq: Once | INTRAVENOUS | Status: AC
  Administered 2018-02-28: 1 g via INTRAVENOUS
  Filled 2018-02-28: qty 50

## 2018-02-28 MED ORDER — HYDROMORPHONE HCL 1 MG/ML IJ SOLN
1.0000 mg | INTRAMUSCULAR | Status: DC | PRN
  Administered 2018-02-28 – 2018-03-04 (×16): 1 mg via INTRAVENOUS
  Filled 2018-02-28 (×17): qty 1

## 2018-02-28 MED ORDER — ENOXAPARIN SODIUM 40 MG/0.4ML ~~LOC~~ SOLN
40.0000 mg | SUBCUTANEOUS | Status: DC
  Administered 2018-02-28 – 2018-03-03 (×4): 40 mg via SUBCUTANEOUS
  Filled 2018-02-28 (×4): qty 0.4

## 2018-02-28 MED ORDER — VANCOMYCIN HCL 10 G IV SOLR
2000.0000 mg | Freq: Once | INTRAVENOUS | Status: AC
  Administered 2018-02-28: 2000 mg via INTRAVENOUS
  Filled 2018-02-28: qty 2000

## 2018-02-28 MED ORDER — LORAZEPAM 2 MG/ML IJ SOLN
2.0000 mg | INTRAMUSCULAR | Status: DC | PRN
  Administered 2018-03-01: 2 mg via INTRAVENOUS
  Filled 2018-02-28: qty 1

## 2018-02-28 MED ORDER — FOLIC ACID 1 MG PO TABS
1.0000 mg | ORAL_TABLET | Freq: Every day | ORAL | Status: DC
  Administered 2018-03-01: 1 mg via ORAL
  Filled 2018-02-28: qty 1

## 2018-02-28 MED ORDER — VITAMIN B-1 100 MG PO TABS
100.0000 mg | ORAL_TABLET | Freq: Every day | ORAL | Status: DC
  Administered 2018-03-01: 100 mg via ORAL
  Filled 2018-02-28: qty 1

## 2018-02-28 MED ORDER — ACETAMINOPHEN 650 MG RE SUPP: 650.0000 mg | Freq: Four times a day (QID) | RECTAL | Status: DC | PRN

## 2018-02-28 MED ORDER — GADOBUTROL 1 MMOL/ML IV SOLN
9.0000 mL | Freq: Once | INTRAVENOUS | Status: AC | PRN
  Administered 2018-02-28: 9 mL via INTRAVENOUS

## 2018-02-28 MED ORDER — PIPERACILLIN-TAZOBACTAM 3.375 G IVPB
3.3750 g | Freq: Three times a day (TID) | INTRAVENOUS | Status: DC
  Administered 2018-02-28 – 2018-03-03 (×9): 3.375 g via INTRAVENOUS
  Filled 2018-02-28 (×9): qty 50

## 2018-02-28 MED ORDER — SENNOSIDES-DOCUSATE SODIUM 8.6-50 MG PO TABS
1.0000 | ORAL_TABLET | Freq: Two times a day (BID) | ORAL | Status: DC
  Administered 2018-02-28 – 2018-03-04 (×5): 1 via ORAL
  Filled 2018-02-28 (×6): qty 1

## 2018-02-28 MED ORDER — ACETAMINOPHEN 325 MG PO TABS: 650.0000 mg | ORAL_TABLET | Freq: Four times a day (QID) | ORAL | Status: DC | PRN

## 2018-02-28 NOTE — ED Notes (Signed)
Pt c/o blurry vision in both eyes that began this morning

## 2018-02-28 NOTE — Consult Note (Signed)
ORTHOPAEDIC CONSULTATION  REQUESTING PHYSICIAN: Robert [name], Robert A, MD  PCP:  Patient, No Pcp Per  Chief Complaint: Bilateral LE pain, swelling  HPI: Robert SinclairJoseph [name] is [name] 38 y.o. male who complains of bilateral ankle redness and warmth and swelling.  He also has pain with range of motion.  He has [name] IV drug user who admits to poor "technique" as well as sticking himself multiple times throughout the last couple of days to help alleviate some of the pressure within the skin.  He denies any fevers or night sweats or chills.  Currently he denies any numbness or tingling in the lower extremities.  He has had no specific treatment for the legs.  He has been in the emergency department awaiting MRI scans to assess for intra-articular effusion or actionable soft tissue abscesses.  Past Medical History:  Diagnosis Date  . Anxiety   . Arthritis   . Depression    Past Surgical History:  Procedure Laterality Date  . GALLBLADDER SURGERY     Social History   Socioeconomic History  . Marital status: Single    Spouse name: Not on file  . Number of children: Not on file  . Years of education: Not on file  . Highest education level: Not on file  Occupational History  . Not on file  Social Needs  . Financial resource strain: Not on file  . Food insecurity:    Worry: Not on file    Inability: Not on file  . Transportation needs:    Medical: Not on file    Non-medical: Not on file  Tobacco Use  . Smoking status: Current Every Day Smoker    Types: Cigarettes  . Smokeless tobacco: Never Used  Substance and Sexual Activity  . Alcohol use: Yes    Alcohol/week: 0.0 standard drinks  . Drug use: Yes    Types: Cocaine, Heroin, Methamphetamines  . Sexual activity: Not on file  Lifestyle  . Physical activity:    Days per week: Not on file    Minutes per session: Not on file  . Stress: Not on file  Relationships  . Social connections:    Talks on phone: Not on file    Gets together: Not  on file    Attends religious service: Not on file    Active member of club or organization: Not on file    Attends meetings of clubs or organizations: Not on file    Relationship status: Not on file  Other Topics Concern  . Not on file  Social History Narrative  . Not on file   Family History  Problem Relation Age of Onset  . Diabetes Mother   . Diabetes Maternal Grandfather    Allergies  Allergen Reactions  . Hydrocodone     nausea   Prior to Admission medications   Medication Sig Start Date End Date Taking? Authorizing Provider  DULoxetine (CYMBALTA) 30 MG capsule Take 1 capsule (30 mg total) by mouth daily. 07/07/17  Yes Pucilowska, Ellin GoodieJolanta B, MD   Mr Ankle Right W Wo Contrast  Result Date: 02/28/2018 CLINICAL DATA:  IV drug abuse, needle marks along the ankle, swelling and erythema. EXAM: MRI OF THE RIGHT ANKLE WITHOUT AND WITH CONTRAST TECHNIQUE: Multiplanar, multisequence MR imaging of the ankle was performed before and after the administration of intravenous contrast. CONTRAST:  9 cc Gadavist COMPARISON:  None. FINDINGS: TENDONS Peroneal: Unremarkable Posteromedial: Distal tibialis posterior tenosynovitis. Mild flexor hallucis longus tenosynovitis. Anterior: Unremarkable Achilles: Unremarkable Plantar  Fascia: Borderline thickening of the medial band of plantar fascia proximally. LIGAMENTS Lateral: Unremarkable Medial: Unremarkable CARTILAGE Ankle Joint: 8 mm osteochondral lesion of the medial talar dome appears non-fragmented. No joint effusion. Subtalar Joints/Sinus Tarsi: Unremarkable Bones: No significant extra-articular osseous abnormalities identified. No findings of osteomyelitis. Other: Subcutaneous edema diffusely along the ankle and tracking along into the dorsum of the foot, with associated diffuse subcutaneous enhancement especially medially, compatible with cellulitis. No drainable abscess. IMPRESSION: IMPRESSION 1. Cellulitis of the ankle tracking into the dorsum of the  foot. No drainable abscess or osteomyelitis. 2. 8 mm non-fragmented osteochondral lesion of the medial talar dome. 3. Mild tibialis posterior and flexor hallucis longus tenosynovitis. 4. Borderline thickening of the medial band of the plantar fascia could reflect mild plantar fasciitis. Electronically Signed   By: Gaylyn Rong M.D.   On: 02/28/2018 17:54   Mr Ankle Left W Wo Contrast  Result Date: 02/28/2018 CLINICAL DATA:  Needle marks along the ankles, IV drug abuse. Erythema and swelling of the ankles. EXAM: MRI OF THE LEFT ANKLE WITHOUT AND WITH CONTRAST TECHNIQUE: Multiplanar, multisequence MR imaging of the ankle was performed before and after the administration of intravenous contrast. CONTRAST:  9 cc Gadavist COMPARISON:  None. FINDINGS: TENDONS Peroneal: Mild common peroneus tendon sheath tenosynovitis. Posteromedial: Mild tibialis posterior and mild flexor hallucis longus tenosynovitis. Anterior: Unremarkable Achilles: Unremarkable Plantar Fascia: Unremarkable LIGAMENTS Lateral: Unremarkable Medial: Unremarkable CARTILAGE Ankle Joint: Unremarkable Subtalar Joints/Sinus Tarsi: Unremarkable Bones: Unremarkable Other: Subcutaneous edema along the medial, lateral, and anterior ankle and extending into the dorsum of the foot, with some low-grade enhancement especially overlying the medial ankle, favoring cellulitis. IMPRESSION: IMPRESSION 1. Notable subcutaneous edema along the ankle, with some enhancement medially favoring cellulitis. No findings of osteomyelitis or septic joint. 2. Mild flexor tenosynovitis as noted above. Electronically Signed   By: Gaylyn Rong M.D.   On: 02/28/2018 17:34    Positive ROS: All other systems have been reviewed and were otherwise negative with the exception of those mentioned in the HPI and as above.  Physical Exam: General: Alert, no acute distress Cardiovascular: No pedal edema Respiratory: No cyanosis, no use of accessory musculature GI: No  organomegaly, abdomen is soft and non-tender Skin: No lesions in the area of chief complaint Neurologic: Sensation intact distally Psychiatric: Patient is competent for consent with normal mood and affect Lymphatic: No axillary or cervical lymphadenopathy  MUSCULOSKELETAL:  Bilateral lower extremities are warm and erythematous to the touch up to the mid tibia down to the toes.  He has no pain with active or passive range of motion to the ankle or subtalar joint.  He does have diffuse tenderness throughout the dorsum of the feet bilateral.  Otherwise neurovascular intact.  Assessment: Bilateral lower extremity cellulitis related to IV drug use  Plan: -I reviewed the MRIs of bilateral ankles.  He has no ankle joint effusion and no otherwise signs of septic arthritis.  He also has no signs of deep space abscesses that would require surgical intervention.  At this time there is no further recommendation from an orthopedic standpoint. -Admission and cellulitis management per the internal medicine service.    Yolonda Kida, MD Cell [phone]    02/28/2018 7:26 PM

## 2018-02-28 NOTE — Progress Notes (Signed)
Bilateral ankle MRI's reviewed.  No septic arthrosis or drainable abscesses noted.  Ok for full diet, no surgical intervention indicated.  Full consult note to follow.

## 2018-02-28 NOTE — Progress Notes (Signed)
Pharmacy Antibiotic Note  Robert [name] is a 38 y.o. male admitted on 02/28/2018 with cellulitis.  Pharmacy has been consulted for vancomycin and Zosyn dosing.  Plan: Start Zosyn 3.375 gm IV q8h (4 hour infusion) Give vancomycin 2g IV x 1, then start vancomycin 1g IV Q12h Monitor clinical picture, renal function, vanc levels prn F/U C&S, abx deescalation / LOT  Height: 5\' 5"  (165.1 cm) Weight: 200 lb (90.7 kg) IBW/kg (Calculated) : 61.5  Temp (24hrs), Avg:99.1 F (37.3 C), Min:99.1 F (37.3 C), Max:99.1 F (37.3 C)  Recent Labs  Lab 02/28/18 1045  WBC 14.2*  CREATININE 0.84    Estimated Creatinine Clearance: 124.7 mL/min (by C-G formula based on SCr of 0.84 mg/dL).    Allergies  Allergen Reactions  . Hydrocodone     nausea   Thank you for allowing pharmacy to be a part of this patient's care.  Armandina Stammer 02/28/2018 1:25 PM

## 2018-02-28 NOTE — H&P (Signed)
Date: 02/28/2018               Patient Name:  Robert [name] MRN: [id number]  DOB: [birth year] Age / Sex: 38 y.o., male   PCP: Patient, No Pcp Per         Medical Service: Internal Medicine Teaching Service         Attending Physician: Dr. Rogelia Boga, Austin Miles, MD    First Contact: Dr. Beryl Meager Pager: [phone]  Second Contact: Dr. Levora Dredge Pager: [phone]       After Hours (After 5p/  First Contact Pager: [phone]  weekends / holidays): Second Contact Pager: [phone]   Chief Complaint: Upper extremity and lower extremity swelling  History of Present Illness: Robert [name] is a 38 y.o male with a history of substance abuse and depression who presented to the ED with bilateral upper and lower extremity swelling/pain. He has used multiple substances since the age of 62 and recently switched to IV drugs about 9 months ago, but just recently started to use it heavily. Last used cocaine, heroin, and meth this AM. He states that he has recurrent abscess as a result and has needed multiple I&Ds. He was seen on 12/24 for an abscess in the arm and prescribed some antibiotics. States that he only took 3 days worth of antibiotics then felt better so he stopped taking them.   This morning looked down and noticed increased swelling and erythema. He got the idea to stick needles in his arms and legs to "drain the fluid." He feels this helped to decrease the swelling. Describes sticking himself >100 times per day. Describes the act as very painful but helpful. After the initial improvement he noticed worsening again so he tried to drain his arms and legs a second time. This did not help so he came to the ED. Describes pre-syncope and blurry vision this AM. Endorses chills. Denies fevers, N/V, diarrhea, chest pain or abdominal pain. He injects in the arms, legs, and feet. Has had substance withdrawals in the past. He did go to inpatient treatment ([facility]) in the past, within the past 12 months, but  then relapsed.   ED course: On arrival, he was normotensive and afebrile. Leukocytosis 14.2, hyponatremia 132, hypokalemia 3.3. EKG showed NSR. Blood cultures were drawn and he was started on cefazolin.  Meds: Does not use any prescription medications but is supposed to be on an antibiotic and cymbalta.   Allergies: Allergies as of 02/28/2018 - Review Complete 02/28/2018  Allergen Reaction Noted  . Hydrocodone  02/28/2018   Past Medical History:  Diagnosis Date  . Anxiety   . Arthritis   . Depression    Family History: Family history significant for DM.  Social History: Currently lives in a motel. Multiple substances since the age of 56 but just recently switched to IV drugs about 9 months ago, but just recently started to use it heavily. Last used cocaine, heroin, and meth this AM. He injects in the arms, legs, and feet. Does drink EtOH with the substance, he denies previous withdrawals. Does use tobacco products.  Review of Systems: A complete ROS was negative except as per HPI.   Physical Exam: Blood pressure 139/89, pulse 93, temperature 99.1 F (37.3 C), temperature source Oral, resp. rate 17, height 5\' 5"  (1.651 m), weight 90.7 kg, SpO2 100 %.  Physical Exam  Constitutional: He is oriented to person, place, and time and well-developed, well-nourished, and in no distress.  Cardiovascular: Normal  rate, regular rhythm, normal heart sounds and intact distal pulses.  No murmur heard. Pulmonary/Chest: Effort normal and breath sounds normal. No respiratory distress. He has no wheezes. He has no rales.  Musculoskeletal:        General: Tenderness and edema present.     Right ankle: He exhibits swelling. He exhibits normal range of motion. Tenderness.     Left ankle: He exhibits swelling. He exhibits normal range of motion. No tenderness.  Neurological: He is alert and oriented to person, place, and time.  Skin: Skin is warm. There is erythema.  Petechial rash on left LE  mid-shin, bilateral UE anterior cubital fossa abscesses, multiple track marks on bilateral UE and LE, splinter hemorrhages noted on right thumb nail, right ring fingernail and left toenail   Psychiatric: Mood and memory normal.  Pressured speech and tangential thought process.    EKG: personally reviewed my interpretation is sinus rhythm  CXR: n/a  Assessment & Plan by Problem: Active Problems:   Cellulitis  Robert [name] is a 38 y.o male with a history of substance abuse and depression who presented to the ED with bilateral upper and lower extremity swelling/pain. He has used multiple substances since the age of 4 and recently switched to IV drugs about 9 months ago, but just recently started to use it heavily. Last used cocaine, heroin, and meth this AM. He was recently prescribed an antibiotic for a right arm abscess but did not complete the course. He noticed worsening swelling in his arms and legs and started to "drain the fluid" by sticking himself with needles.   Cellulitis  Patient presented with bilateral upper and lower extremity swelling/pain. All four extremities with severe erythema, swelling and multiple track marks. Bilateral upper extremities with abscesses in the anterior cubital fossa's. On the left lower extremity, mid-shin, there is a small petechial rash. Also, splinter hemorrhages noted on right hand and left foot. He meets two minor criteria for the Modified Duke's criteria. Given his history of IVDU for the past nine months there is concern for bacteremia and endocarditis. Blood cultures x2 were drawn in the ED he was started on cefazolin.  Will switch to broad-spectrum antibiotics, vancomycin and piperacillin-tazobactam for MRSA and anaerobic coverage.  There is also concern for septic arthritis in bilateral ankles because of decreased active and passive range of motion, tenderness, erythema and swelling.  Consulted Ortho for possible washout.  They recommended obtaining  bilateral ankle MRIs.  - Consulted ortho, she recommendations - Echocardiogram - Start vancomycin and piperacillin-tazobactam - Follow-up blood cultures - Bilateral ankle MRIs - Cardiac monitoring - Pain control: Dilaudid 1 mg IV every 3 hours as needed  History of IV drug use Patient last used cocaine, heroin and meth this morning. He has been using multiple substances since the age of 13, recently switched to IV drug use 9 months ago. He injects himself "head to toe" in multiple areas.  He endorsed withdrawal in the past.  Monitor for signs and symptoms of withdrawal -CIWA protocol with Ativan  Depression Patient reported a history of depression, home medication duloxetine.   Diet: Regular DVT prophylaxis: Lovenox Full code   Dispo: Admit patient to Inpatient with expected length of stay greater than 2 midnights.  SignedJaci Standard, DO 02/28/2018, 1:22 PM  Pager: [phone]

## 2018-02-28 NOTE — ED Notes (Signed)
Pt transported to MRI 

## 2018-02-28 NOTE — ED Provider Notes (Signed)
[facility] Provider Note   CSN: [id number] Arrival date & time: 02/28/18  0915     History   Chief Complaint Chief Complaint  Patient presents with  . Leg Swelling    HPI Robert SinclairJoseph [name] is a 38 y.o. male.  The history is provided by the patient and a relative. No language interpreter was used.  Leg Pain   This is a new problem. The current episode started more than 2 days ago. The problem occurs constantly. The problem has been gradually worsening. The pain is present in the right arm, left lower leg, right upper leg and left upper leg. The pain is moderate. Associated symptoms include limited range of motion. He has tried nothing for the symptoms. The treatment provided no relief.  Pt complains of pain both arms and both legs.  Pt reports legs and arms are swollen.  Pt has been sticking needles in arms and legs to drain the fluid/   Past Medical History:  Diagnosis Date  . Anxiety   . Arthritis   . Depression     Patient Active Problem List   Diagnosis Date Noted  . Cocaine use disorder, moderate, dependence (HCC) 07/07/2017  . 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 Substance induced mood disorder (HCC) 03/08/2017  . Alcoholism (HCC) 09/12/2014    Past Surgical History:  Procedure Laterality Date  . GALLBLADDER SURGERY          Home Medications    Prior to Admission medications   Medication Sig Start Date End Date Taking? Authorizing Provider  DULoxetine (CYMBALTA) 30 MG capsule Take 1 capsule (30 mg total) by mouth daily. 07/07/17   Pucilowska, Ellin GoodieJolanta B, MD    Family History Family History  Problem Relation Age of Onset  . Diabetes Mother   . Diabetes Maternal Grandfather     Social History Social History   Tobacco Use  . Smoking status: Current Every Day Smoker    Types:  Cigarettes  . Smokeless tobacco: Never Used  Substance Use Topics  . Alcohol use: Yes    Alcohol/week: 0.0 standard drinks  . Drug use: Yes    Types: Cocaine, Heroin, Methamphetamines     Allergies   Hydrocodone   Review of Systems Review of Systems  Skin: Positive for wound.  All other systems reviewed and are negative.    Physical Exam Updated Vital Signs BP 128/82 (BP Location: Left Arm)   Pulse (!) 101   Temp 99.1 F (37.3 C) (Oral)   Resp 15   Ht 5\' 5"  (1.651 m)   Wt 90.7 kg   SpO2 98%   BMI 33.28 kg/m   Physical Exam Vitals signs and nursing note reviewed.  Constitutional:      Appearance: He is well-developed.  HENT:     Head: Normocephalic and atraumatic.     Comments: Grinding teeth,     Mouth/Throat:     Mouth: Mucous membranes are moist.  Eyes:     Conjunctiva/sclera: Conjunctivae normal.  Neck:     Musculoskeletal: Neck supple.  Cardiovascular:     Rate and Rhythm: Regular rhythm. Tachycardia present.     Heart sounds: No murmur.  Pulmonary:     Effort: Pulmonary effort is normal. No respiratory distress.     Breath sounds: Normal breath sounds.  Abdominal:     Palpations: Abdomen is soft.  Tenderness: There is no abdominal tenderness.  Musculoskeletal:        General: Swelling and tenderness present.     Right lower leg: Edema present.     Left lower leg: Edema present.     Comments: Swelling and redness bilat lower legs, redness bilat arms contractures antecubital areas.   Skin:    General: Skin is dry.     Findings: Bruising and erythema present.  Neurological:     General: No focal deficit present.     Mental Status: He is alert.  Psychiatric:        Mood and Affect: Mood normal.      ED Treatments / Results  Labs (all labs ordered are listed, but only abnormal results are displayed) Labs Reviewed  CULTURE, BLOOD (ROUTINE X 2)  CULTURE, BLOOD (ROUTINE X 2)  CBC WITH DIFFERENTIAL/PLATELET  COMPREHENSIVE METABOLIC PANEL    URINALYSIS, ROUTINE W REFLEX MICROSCOPIC  RAPID URINE DRUG SCREEN, HOSP PERFORMED    EKG None  Radiology No results found.  Procedures Procedures (including critical care time)  Medications Ordered in ED Medications - No data to display   Initial Impression / Assessment and Plan / ED Course  I have reviewed the triage vital signs and the nursing notes.  Pertinent labs & imaging results that were available during my care of the patient were reviewed by me and considered in my medical decision making (see chart for details).     MDM Pt finished/stopped taking Doxycycline that was prescribed on 12/24.  Pt reports he was sticking needles in his legs to decrease the swelling  Iv Ancef ordered, Pt has an elevated wbc count.    Final Clinical Impressions(s) / ED Diagnoses   Final diagnoses:  Cellulitis of left lower extremity  Cellulitis of leg, right    ED Discharge Orders    None    I spoke with Internal Medicine Resident who will see pt here for admission and treatment   Osie CheeksSofia, Dyllen Menning K, PA-C 02/28/18 1403    Benjiman CorePickering, Nathan, MD 02/28/18 1530

## 2018-02-28 NOTE — ED Notes (Signed)
Pt mom cell- [phone]

## 2018-02-28 NOTE — ED Notes (Signed)
Pt requesting food and to speak with his dr. Dr. Caron Presume contacted with same.

## 2018-02-28 NOTE — ED Triage Notes (Signed)
Pt states he has been abusing cocaine, injecting in legs and feet, arms. Pt has swelling/redness, small wound in AC. Pt states he is high on cocaine at this time.

## 2018-03-01 ENCOUNTER — Inpatient Hospital Stay (HOSPITAL_COMMUNITY): Payer: Self-pay

## 2018-03-01 DIAGNOSIS — F19939 Other psychoactive substance use, unspecified with withdrawal, unspecified: Secondary | ICD-10-CM

## 2018-03-01 DIAGNOSIS — Z8659 Personal history of other mental and behavioral disorders: Secondary | ICD-10-CM

## 2018-03-01 LAB — CBC
HCT: 37.6 % — ABNORMAL LOW (ref 39.0–52.0)
Hemoglobin: 12 g/dL — ABNORMAL LOW (ref 13.0–17.0)
MCH: 29 pg (ref 26.0–34.0)
MCHC: 31.9 g/dL (ref 30.0–36.0)
MCV: 90.8 fL (ref 80.0–100.0)
Platelets: 223 10*3/uL (ref 150–400)
RBC: 4.14 MIL/uL — ABNORMAL LOW (ref 4.22–5.81)
RDW: 12.5 % (ref 11.5–15.5)
WBC: 9 10*3/uL (ref 4.0–10.5)
nRBC: 0 % (ref 0.0–0.2)

## 2018-03-01 LAB — BASIC METABOLIC PANEL
Anion gap: 9 (ref 5–15)
BUN: 6 mg/dL (ref 6–20)
CO2: 26 mmol/L (ref 22–32)
Calcium: 8.5 mg/dL — ABNORMAL LOW (ref 8.9–10.3)
Chloride: 101 mmol/L (ref 98–111)
Creatinine, Ser: 0.95 mg/dL (ref 0.61–1.24)
GFR calc Af Amer: >60 mL/min (ref >60)
GFR calc non Af Amer: >60 mL/min (ref >60)
Glucose, Bld: 157 mg/dL — ABNORMAL HIGH (ref 70–99)
Potassium: 3 mmol/L — ABNORMAL LOW (ref 3.5–5.1)
Sodium: 136 mmol/L (ref 135–145)

## 2018-03-01 LAB — URINALYSIS, ROUTINE W REFLEX MICROSCOPIC
Bilirubin Urine: NEGATIVE
Glucose, UA: NEGATIVE mg/dL
Hgb urine dipstick: NEGATIVE
Ketones, ur: NEGATIVE mg/dL
Leukocytes, UA: NEGATIVE
Nitrite: NEGATIVE
Protein, ur: NEGATIVE mg/dL
Specific Gravity, Urine: 1.014 (ref 1.005–1.030)
pH: 5 (ref 5.0–8.0)

## 2018-03-01 LAB — RAPID URINE DRUG SCREEN, HOSP PERFORMED
Amphetamines: POSITIVE — AB
Barbiturates: NOT DETECTED
Benzodiazepines: NOT DETECTED
Cocaine: POSITIVE — AB
Opiates: POSITIVE — AB
Tetrahydrocannabinol: POSITIVE — AB

## 2018-03-01 LAB — HIV ANTIBODY (ROUTINE TESTING W REFLEX): HIV Screen 4th Generation wRfx: NONREACTIVE

## 2018-03-01 LAB — MRSA PCR SCREENING: MRSA by PCR: NEGATIVE

## 2018-03-01 MED ORDER — VITAMIN B-1 100 MG PO TABS
100.0000 mg | ORAL_TABLET | Freq: Every day | ORAL | Status: DC
  Administered 2018-03-02 – 2018-03-04 (×3): 100 mg via ORAL
  Filled 2018-03-01 (×3): qty 1

## 2018-03-01 MED ORDER — FOLIC ACID 1 MG PO TABS
1.0000 mg | ORAL_TABLET | Freq: Every day | ORAL | Status: DC
  Administered 2018-03-02 – 2018-03-04 (×3): 1 mg via ORAL
  Filled 2018-03-01 (×3): qty 1

## 2018-03-01 MED ORDER — ADULT MULTIVITAMIN W/MINERALS CH
1.0000 | ORAL_TABLET | Freq: Every day | ORAL | Status: DC
  Administered 2018-03-02 – 2018-03-04 (×3): 1 via ORAL
  Filled 2018-03-01 (×3): qty 1

## 2018-03-01 MED ORDER — THIAMINE HCL 100 MG/ML IJ SOLN: 100.0000 mg | Freq: Every day | INTRAMUSCULAR | Status: DC

## 2018-03-01 MED ORDER — POTASSIUM CHLORIDE CRYS ER 20 MEQ PO TBCR
40.0000 meq | EXTENDED_RELEASE_TABLET | Freq: Once | ORAL | Status: AC
  Administered 2018-03-01: 40 meq via ORAL
  Filled 2018-03-01: qty 2

## 2018-03-01 MED ORDER — LORAZEPAM 1 MG PO TABS
1.0000 mg | ORAL_TABLET | Freq: Four times a day (QID) | ORAL | Status: AC | PRN
  Administered 2018-03-01 – 2018-03-04 (×7): 1 mg via ORAL
  Filled 2018-03-01 (×7): qty 1

## 2018-03-01 MED ORDER — WHITE PETROLATUM EX OINT
TOPICAL_OINTMENT | CUTANEOUS | Status: AC
  Administered 2018-03-01: 15:00:00
  Filled 2018-03-01: qty 28.35

## 2018-03-01 MED ORDER — LORAZEPAM 2 MG/ML IJ SOLN
1.0000 mg | Freq: Four times a day (QID) | INTRAMUSCULAR | Status: AC | PRN
  Administered 2018-03-02: 1 mg via INTRAVENOUS
  Filled 2018-03-01: qty 1

## 2018-03-01 NOTE — Progress Notes (Signed)
Bladder scan patient noting 595 of urine in bladder per scanner. Patient states he does not "want that catheter" and will be able to urinate if he "gets something for anxiety". Will give 2 mg PRN Ativan and recheck bladder volume.

## 2018-03-01 NOTE — Progress Notes (Addendum)
   Subjective: Mr. Feltz was drowsy on exam this morning after being given 2 mg ativan for withdrawal. He reported he felt about the same but noted his swelling had improved some. He said he was unable to urinate due to anxiety. He declined both in and out and foley catheter at this time.    Objective:  Vital signs in last 24 hours: Vitals:   02/28/18 1645 02/28/18 1805 02/28/18 2227 03/01/18 0526  BP: (!) 161/115 119/88 123/70 (!) 86/44  Pulse: (!) 109 100 96 86  Resp:  (!) 22 (!) 22   Temp:   98.9 F (37.2 C) 98.7 F (37.1 C)  TempSrc:   Oral Oral  SpO2: 97% 100% 98%   Weight:      Height:       Physical Exam  Constitutional: He is well-developed, well-nourished, and in no distress. No distress.  Drowsy and somnolent  Cardiovascular: Normal rate, regular rhythm and normal heart sounds.  No murmur heard. Pulmonary/Chest: Effort normal and breath sounds normal. No respiratory distress. He has no wheezes. He has no rales.  Musculoskeletal:        General: Tenderness and edema present.  Skin: Rash noted. He is not diaphoretic. There is erythema.  Petechial rash on left LE mid-shin, bilateral UE anterior cubital fossa abscesses, multiple track marks on bilateral UE and LE, splinter hemorrhages noted on right thumb nail, right ring fingernail and left toenail    Psychiatric: Mood and memory normal.    Assessment/Plan:  Active Problems:   Cellulitis  Robert [name] is a 38 y.o male with a history of substance abuse and depression who presented to the ED with bilateral upper and lower extremity swelling/pain. He has used multiple substances since the age of 2 and recently switched to IV drugs about 9 months ago, but just recently started to use it heavily. Last used cocaine, heroin, and meth this AM. He was recently prescribed an antibiotic for a right arm abscess but did not complete the course. He noticed worsening swelling in his arms and legs and started to "drain the fluid" by  sticking himself with needles.   Cellulitis 2/2 IV drug use  All four extremities continue to have erythema, swelling and multiple track marks. Blood cultures NGTD. Bilateral ankle MRI's negative for osteomyelitis or septic arthritis; showed cellulitis and mild tenosynovitis. Orthopedics has no further recommendations at this time, signed off. Echocardiogram is pending. Continue antibiotics. - Ortho signed off, appreciate recommendations  - F/u Echocardiogram - Continue vancomycin and piperacillin-tazobactam - Follow-up blood cultures - Cardiac monitoring - Pain control: Dilaudid 1 mg IV every 3 hours as needed  History of IV drug use Per RN, patient has s/sx of withdrawal this morning and given 2 mg ativan, mainly diaphoretic. He was drowsy after this dose so decreased to 1 mg. Urine drug screen positive for opiates, cocaine, amphetamines, and tetrahydrocannabinols.  -CIWA protocol with Ativan  Urinary retention Patient has had urinary retention since admission. He states its due to his anxiety. He is declining both foley catheter and ins and outs at this time.   - attempt foley catheter vs in and out cath if patient continues to retain urine  Depression Currently not on any medication. Will obtain a PHQ-9.   Dispo: Anticipated discharge in approximately 2-3 day(s).   Jaci Standard, DO 03/01/2018, 6:28 AM Pager: [phone]

## 2018-03-01 NOTE — Progress Notes (Signed)
Pt arrived to unit around 2100 on bed, pt alert and oriented X4. Lung sound and abdominal sound WNL. Pt refused to removed under pant, stated than nothing wrong in that area and that only is leg are hurting. The rest of pt skin have no issue. Pt received med for pain.

## 2018-03-01 NOTE — Progress Notes (Signed)
Consult was placed for another iv restart;  Pt was seen in the ER yesterday and attempted by 2 IV Team RNs;  Pt with extremely poor venous access;  Pt openly admits to being a drug user;  Both arms have red , very hard, raised areas, especially in the left [facility] area;  Pt has a HOLE in the skin of the R AC, admittedly, by pt, from drug use;  Able to place a new IV with ultrasound in the right upper arm, above and lateral to the "hole" in his skin.  Pt advised to NOT TAKE ANOTHER SHOWER with this iv , as access is extremely limited for his IV pain medications.  Barkley Bruns RN IV Team

## 2018-03-01 NOTE — Progress Notes (Signed)
Date: 03/01/2018  Patient name: Robert [name]  Medical record number: [id number]  Date of birth: 11/25/1980   I have seen and evaluated Robert [name] and discussed their care with the Residency Team.  Robert [name] is a 38 year old man with substance abuse disorder who presents with bilateral upper and lower extremity swelling and pain.  He was seen in the ED on December 24 for a arm abscess and took 3 days of the prescribed antibiotics.  On the morning of admission, he noticed increased swelling and erythema of his legs.  He stuck his legs and arms with needles to drain the fluid and did this numerous times over the day.  It was not helping so he came to the ED.  PMHx, Fam Hx, and/or Soc Hx : He uses cocaine, heroin, meth, and marijuana.  He had been living with his mother but most recently living in a motel.  Vitals:   03/01/18 0900 03/01/18 1000  BP:    Pulse: 83   Resp: (!) 25 (!) 26  Temp:    SpO2: 96%   T-max 99.1 Blood pressure 86/44 General asleep but was awoken by voice.  Frequently directed off during our visit. H RRR no MRG Skin slight erythema to lower extremities.  Abscesses bilateral antecubital fossa's.  Splinter hemorrhage right thumb nail and ring finger. Neuro no tremors, no diaphoresis, no agitation, somnolence but he just received Ativan  Na 132 - 136 K 3.3 - 3.0 Glucose 157 Creatinine 0.95 Albumin 3.4 WBC 14.2 - ABX 9.0 HgB 13.1 - 12.0 MCV 91 ANC 11.2 UDS positive for THC, cocaine, opioids, and amphetamines UA normal MRSA PCR negative Blood cultures negative at 24 hours  I personally viewed the EKG and confirmed my reading with the official read.  Sinus, normal axis, normal intervals, no ischemic changes  MRI right and left ankle showed subcutaneous edema suggestive of cellulitis.  No osteomyelitis or septic joint.  Assessment and Plan: I have seen and evaluated the patient as outlined above. I agree with the formulated Assessment and Plan as detailed in the  residents' note, with the following changes:  Robert [name] is a 38 year old man with substance abuse disorder who presents with bilateral upper and lower extremity from cellulitis.  Septic arthritis of the bilateral ankles was suspected based on symptoms and examination but MRI and Ortho consult has ruled this out.  We had a concern for bacteremia and endocarditis.  He is afebrile, his leukocytosis resolved with antibiotics, and there is no murmur on examination.  However due to the high pretest probability, we are checking a TTE.  We will not pursue a TEE unless his blood cultures, which are initially negative at 24 hours, return positive or he develops a murmur, leukocytosis, or fever.  He was sedated due to Ativan administration today so we were unable to discuss depression symptoms or his desire for substance abuse treatment.  His hospital course has been complicated by urinary retention, opioid use could be exacerbating this, and he is currently not accepted of catheterization.  1.  Right and left upper and lower cellulitis secondary to IV drug use - due to suspect for bacteremia, he is on broad antibiotics with vancomycin and Zosyn.  His TTE is pending.  His antibiotics can be narrowed if he remains afebrile without a leukocytosis and his cultures remain negative.  2.  Active IV drug use - team will need to assess his readiness for treatment and whether he chooses a pharmaceutical treatment regimen or  a non-pharmaceutical regimen.  HIV and hepatitis C are pending.  I will get a hepatitis B surface antibody as if he is not immunized, it would be beneficial to get him vaccinated.  3.  History of depression - once he is stable and more alert, a PHQ 2 or 9 would be indicated so that we could start treatment if he elects.  4.  Urinary retention- opioids can cause and exacerbate urinary retention.  He is currently declining a Foley, will follow for now.  Burns Spain, MD 1/5/202011:17 AM

## 2018-03-02 ENCOUNTER — Inpatient Hospital Stay (HOSPITAL_COMMUNITY): Payer: Self-pay

## 2018-03-02 DIAGNOSIS — I339 Acute and subacute endocarditis, unspecified: Secondary | ICD-10-CM

## 2018-03-02 DIAGNOSIS — F1990 Other psychoactive substance use, unspecified, uncomplicated: Secondary | ICD-10-CM

## 2018-03-02 LAB — BASIC METABOLIC PANEL
Anion gap: 9 (ref 5–15)
BUN: 7 mg/dL (ref 6–20)
CO2: 24 mmol/L (ref 22–32)
Calcium: 8.8 mg/dL — ABNORMAL LOW (ref 8.9–10.3)
Chloride: 104 mmol/L (ref 98–111)
Creatinine, Ser: 0.78 mg/dL (ref 0.61–1.24)
GFR calc Af Amer: >60 mL/min (ref >60)
GFR calc non Af Amer: >60 mL/min (ref >60)
Glucose, Bld: 110 mg/dL — ABNORMAL HIGH (ref 70–99)
Potassium: 3.3 mmol/L — ABNORMAL LOW (ref 3.5–5.1)
Sodium: 137 mmol/L (ref 135–145)

## 2018-03-02 LAB — CBC
HCT: 36 % — ABNORMAL LOW (ref 39.0–52.0)
Hemoglobin: 11.3 g/dL — ABNORMAL LOW (ref 13.0–17.0)
MCH: 29 pg (ref 26.0–34.0)
MCHC: 31.4 g/dL (ref 30.0–36.0)
MCV: 92.3 fL (ref 80.0–100.0)
Platelets: 328 10*3/uL (ref 150–400)
RBC: 3.9 MIL/uL — ABNORMAL LOW (ref 4.22–5.81)
RDW: 12.6 % (ref 11.5–15.5)
WBC: 8.1 10*3/uL (ref 4.0–10.5)
nRBC: 0 % (ref 0.0–0.2)

## 2018-03-02 LAB — ECHOCARDIOGRAM COMPLETE
Height: 65 in
Weight: 3200 oz

## 2018-03-02 MED ORDER — POTASSIUM CHLORIDE CRYS ER 20 MEQ PO TBCR
40.0000 meq | EXTENDED_RELEASE_TABLET | Freq: Once | ORAL | Status: AC
  Administered 2018-03-02: 40 meq via ORAL
  Filled 2018-03-02: qty 2

## 2018-03-02 NOTE — Progress Notes (Signed)
  Echocardiogram 2D Echocardiogram has been performed.  Robert [name] T Tavari Loadholt 03/02/2018, 2:26 PM

## 2018-03-02 NOTE — Progress Notes (Signed)
   Subjective: No overnight events. [name] reported that he felt his swelling was improving and his pain level was about the same. No acute complaints.  Objective:  Vital signs in last 24 hours: Vitals:   03/01/18 1843 03/01/18 1847 03/01/18 2038 03/02/18 0601  BP:  120/64 (!) 118/57   Pulse: 97 97 96   Resp: 17 17    Temp:   99.1 F (37.3 C) 98.4 F (36.9 C)  TempSrc:   Oral Oral  SpO2: 99% 99%    Weight:      Height:       Physical Exam  Constitutional: He is oriented to person, place, and time and well-developed, well-nourished, and in no distress.  Cardiovascular: Normal rate, regular rhythm and normal heart sounds.  No murmur heard. Pulmonary/Chest: Effort normal and breath sounds normal. No respiratory distress. He has no wheezes. He has no rales.  Musculoskeletal:        General: Edema present.  Neurological: He is alert and oriented to person, place, and time.  Skin: Skin is warm. There is erythema.  Erythema is improving, petechial rash on left LE mid-shin, bilateral UE anterior cubital fossa abscesses, multiple track marks on bilateral UE and LE, splinter hemorrhages noted on right thumb nail, right ring fingernail and left toenail    Psychiatric: Memory and affect normal.    Assessment/Plan:  Principal Problem:   Cellulitis Active Problems:   Substance induced mood disorder (HCC)   Sedative, hypnotic or anxiolytic use disorder, severe, dependence (HCC)  Robert [name] is a 39 y.o malewith a history of substance abuse and depressionwho presented to the ED with bilateral upper and lower extremity swelling/pain. He has used multiple substances since the age of 16andrecently switched to IV drugs about 9 months ago, but just recently started to use it heavily. Last used cocaine, heroin, and meth this AM. He was recently prescribed an antibiotic for a right arm abscess but did not complete the course. He noticed worsening swelling in his arms and legs and started to  "drain the fluid" by sticking himself with needles.   Cellulitis  Blood cultures NGTD. Echo is pending. All four extremities continue to have erythema and swelling, which is improving. Will continue broad spectrum antibiotics at this time.  - F/u Echocardiogram - Continue vancomycin and piperacillin-tazobactam - BC NGTD - Cardiac monitoring - Pain control: Dilaudid 1 mg IV every 3 hours as needed  History of IV drug use No s/sx of withdrawal on exam today. He was given one dose of ativan 1 mg overnight.  -CIWA protocol with Ativan  Urinary retention Patient had good urinary output, ~900 ml. Resolved.  Depression Currently not on any medication. Will most likely need close follow up with PCP.  - obtain a PHQ-9  Dispo: Anticipated discharge in approximately 1-2 day(s).   Jaci Standard, DO 03/02/2018, 6:31 AM Pager: [phone]

## 2018-03-03 DIAGNOSIS — I071 Rheumatic tricuspid insufficiency: Secondary | ICD-10-CM

## 2018-03-03 LAB — BASIC METABOLIC PANEL WITH GFR
Anion gap: 13 (ref 5–15)
BUN: 5 mg/dL — ABNORMAL LOW (ref 6–20)
CO2: 23 mmol/L (ref 22–32)
Calcium: 9 mg/dL (ref 8.9–10.3)
Chloride: 103 mmol/L (ref 98–111)
Creatinine, Ser: 0.8 mg/dL (ref 0.61–1.24)
GFR calc Af Amer: >60 mL/min
GFR calc non Af Amer: >60 mL/min
Glucose, Bld: 90 mg/dL (ref 70–99)
Potassium: 4 mmol/L (ref 3.5–5.1)
Sodium: 139 mmol/L (ref 135–145)

## 2018-03-03 LAB — VANCOMYCIN, PEAK: Vancomycin Pk: 24 ug/mL — ABNORMAL LOW (ref 30–40)

## 2018-03-03 LAB — HEPATITIS C ANTIBODY: HCV Ab: 0.3 {s_co_ratio} (ref 0.0–0.9)

## 2018-03-03 NOTE — Progress Notes (Signed)
Pharmacy Antibiotic Note  Robert [name] is a 38 y.o. male admitted on 02/28/2018 with cellulitis.  Pharmacy has been consulted for vancomycin and Zosyn dosing.  Pt has a hx of IVDU. He remains afebrile and wbc wnl. An ECHO was ordered and came back neg for vegetations. D4 abx now. Consider narrowing to just vanc. Cultures remain neg. MRSA PCR neg.   Plan: Consider cont zosyn Zosyn 3.375 gm IV q8h (4 hour infusion) Cont vancomycin 1g IV Q12h  Height: 5\' 5"  (165.1 cm) Weight: 200 lb (90.7 kg) IBW/kg (Calculated) : 61.5  Temp (24hrs), Avg:97.9 F (36.6 C), Min:97.4 F (36.3 C), Max:98.2 F (36.8 C)  Recent Labs  Lab 02/28/18 1045 03/01/18 0428 03/02/18 0836 03/03/18 0527  WBC 14.2* 9.0 8.1  --   CREATININE 0.84 0.95 0.78 0.80    Estimated Creatinine Clearance: 130.9 mL/min (by C-G formula based on SCr of 0.8 mg/dL).    Allergies  Allergen Reactions  . Hydrocodone     nausea   Ulyses Southward, PharmD, [place], AAHIVP, CPP Infectious Disease Pharmacist 03/03/2018 9:40 AM

## 2018-03-03 NOTE — Progress Notes (Addendum)
   Subjective: No overnight events. Patient received ativan 1mg  x3 in the last 24 hours. He denies feeling jittery. He states that the pain and swelling is improving.   Objective:  Vital signs in last 24 hours: Vitals:   03/02/18 1258 03/03/18 0430 03/03/18 0549 03/03/18 0748  BP: 125/75  99/60 (!) 106/47  Pulse: 87  79 77  Resp: 19   (!) 22  Temp:  98 F (36.7 C) (!) 97.4 F (36.3 C) 98.2 F (36.8 C)  TempSrc:  Oral Oral Oral  SpO2: 99%  93% 97%  Weight:      Height:       Physical Exam  Constitutional: He is oriented to person, place, and time and well-developed, well-nourished, and in no distress.  Cardiovascular: Normal rate, regular rhythm and normal heart sounds.  No murmur heard. Pulmonary/Chest: Breath sounds normal. No respiratory distress. He has no wheezes. He has no rales.  Musculoskeletal:        General: Tenderness and edema present.  Neurological: He is alert and oriented to person, place, and time.  Skin: Skin is warm. There is erythema.  Erythema is improving, petechial rash on left LE mid-shin improved, bilateral UE anterior cubital fossa abscesses, multiple track marks on bilateral UE and LE, splinter hemorrhages noted on right thumb nail, right ring fingernail and left toenail      Assessment/Plan:  Principal Problem:   Cellulitis Active Problems:   Substance induced mood disorder (HCC)   Sedative, hypnotic or anxiolytic use disorder, severe, dependence (HCC)  Robert [name] is a 38 y.o malewith a history of substance abuse and depressionwho presented to the ED with bilateral upper and lower extremity swelling/pain. He has used multiple substances since the age of 16andrecently switched to IV drugs about 9 months ago, but just recently started to use it heavily. Last used cocaine, heroin, and meth this AM. He was recently prescribed an antibiotic for a right arm abscess but did not complete the course. He noticed worsening swelling in his arms and legs and  started to "drain the fluid" by sticking himself with needles.   Cellulitis Blood cultures NGTD. Echo showed LV EF 60-65% with no vegetations or valvular abnormalities except mild tricuspid regurgitation. All four extremities continue to have erythema and swelling, which is improving. Blood cultures NGTD, will continue vancomycin and discontinue zoysn. -Continuevancomycin, discontinue piperacillin-tazobactam -BC NGTD -Pain control: Dilaudid 1 mg IV every 3 hours as needed  History of IV drug use No s/sx of withdrawal on exam today. He was given ativan 1 mgx3 in the past 24 hours.  -CIWA protocol with Ativan -Cardiac monitoring  Depression Currently not on any medication. Will most likely need close follow up with PCP.   Dispo: Anticipated discharge pending blood cultures and clinical improvement. Patient will require a few more days of IV antibiotics.  Lerry Liner, DO Pager: [phone]

## 2018-03-04 DIAGNOSIS — L03115 Cellulitis of right lower limb: Secondary | ICD-10-CM

## 2018-03-04 LAB — VANCOMYCIN, TROUGH: Vancomycin Tr: 8 ug/mL — ABNORMAL LOW (ref 15–20)

## 2018-03-04 MED ORDER — DOXYCYCLINE HYCLATE 50 MG PO CAPS: 100.0000 mg | ORAL_CAPSULE | Freq: Two times a day (BID) | ORAL | 0 refills | Status: DC

## 2018-03-04 MED ORDER — INFLUENZA VAC SPLIT QUAD 0.5 ML IM SUSY: 0.5000 mL | PREFILLED_SYRINGE | Freq: Once | INTRAMUSCULAR | Status: DC

## 2018-03-04 MED ORDER — DULOXETINE HCL 30 MG PO CPEP: 30.0000 mg | ORAL_CAPSULE | Freq: Every day | ORAL | 1 refills | Status: DC

## 2018-03-04 MED ORDER — DOXYCYCLINE HYCLATE 50 MG PO CAPS: 100.0000 mg | ORAL_CAPSULE | Freq: Two times a day (BID) | ORAL | 0 refills | Status: AC

## 2018-03-04 MED ORDER — VANCOMYCIN HCL 10 G IV SOLR
1250.0000 mg | Freq: Two times a day (BID) | INTRAVENOUS | Status: DC
  Administered 2018-03-04: 1250 mg via INTRAVENOUS
  Filled 2018-03-04 (×2): qty 1250

## 2018-03-04 NOTE — Progress Notes (Signed)
Robert [name] to be D/C'd Home per MD order.  Discussed with the patient and all questions fully answered.  VSS, Skin clean, dry and intact without evidence of skin break down, no evidence of skin tears noted. IV catheter discontinued intact. Site without signs and symptoms of complications. Dressing and pressure applied.  An After Visit Summary was printed and given to the patient. Patient received prescription.  D/c education completed with patient/family including follow up instructions, medication list, d/c activities limitations if indicated, with other d/c instructions as indicated by MD - patient able to verbalize understanding, all questions fully answered.   Patient instructed to return to ED, call 911, or call MD for any changes in condition.   Patient escorted via WC, and D/C home via private auto.  [name] 03/04/2018 2:18 PM

## 2018-03-04 NOTE — Progress Notes (Signed)
   Subjective: No overnight events. Patient stated he felt that he was withdrawing. He said he felt anxious, jittery, hot, cold and sweating. He feels his swelling has improved but continues to have pain. He feels the pain medication effects only last one hour.   Objective:  Vital signs in last 24 hours: Vitals:   03/03/18 1144 03/03/18 2026 03/04/18 0000 03/04/18 0342  BP: 118/71 (!) 103/53 112/73 113/61  Pulse: 84 81 79 77  Resp: 18 20  20   Temp: 98 F (36.7 C) 99.1 F (37.3 C)  98.3 F (36.8 C)  TempSrc: Oral Oral  Oral  SpO2: 98% 100%  97%  Weight:      Height:       Physical Exam  Constitutional: He is oriented to person, place, and time and well-developed, well-nourished, and in no distress.  Cardiovascular: Normal rate, regular rhythm, normal heart sounds and intact distal pulses.  No murmur heard. Pulmonary/Chest: Effort normal and breath sounds normal. No respiratory distress. He has no wheezes.  Musculoskeletal:        General: Edema present.     Comments: Non-pitting edema, decreased bilateral ankle swelling, increased ROM bilateral ankles  Neurological: He is alert and oriented to person, place, and time.  Skin: Skin is warm.  Erythema has significantly improved, splinter hemorrhages noted on right thumb nail, right ring fingernail and left toenail     Assessment/Plan:  Principal Problem:   Cellulitis Active Problems:   Substance induced mood disorder (HCC)   Sedative, hypnotic or anxiolytic use disorder, severe, dependence (HCC)  Robert [name] is a 38 y.o malewith a history of substance abuse and depressionwho presented to the ED with bilateral upper and lower extremity swelling/pain. He has used multiple substances since the age of 38andrecently switched to IV drugs about 9 months ago, but just recently started to use it heavily. Last used cocaine, heroin, and meth this AM. He was recently prescribed an antibiotic for a right arm abscess but did not complete  the course. He noticed worsening swelling in his arms and legs and started to "drain the fluid" by sticking himself with needles.   Cellulitis Blood cultures NGTD.  All four extremities continue to have erythema and swelling, which is improving. Will transition to oral antibiotics at discharge. -Continuevancomycin, day 5 -BC NGTD -Pain control: Dilaudid 1 mg IV every 3 hours as needed  History of IV drug use Patient reported feeling anxious, jittery and having chills. He was given ativan 1 mgx4 in the past 24 hours.  -CIWA protocol with Ativan -Cardiac monitoring   Dispo: Anticipated discharge in approximately 0-1 day(s).   Jaci Standard, DO 03/04/2018, 6:35 AM Pager: [phone]

## 2018-03-04 NOTE — Care Management Note (Addendum)
Case Management Note  Patient Details  Name: [name] MRN: [id number] Date of Birth: [birth year]  Subjective/Objective:    Bilateral upper and lower extremity cellulitis, Polysubstance use disorder.          PCP: none , will f/u with [facility]  Action/Plan: Transition to home.  [facility]   [phone] [address] [place] [address]    Next Steps: Follow up on 03/24/2018    Instructions: 2:30 pm, Zelda Flemmings     Pt states can afford Rx meds.and has transportation home.  Expected Discharge Date:  03/04/18               Expected Discharge Plan:  Home/Self Care  In-House Referral:  SW  Discharge planning Services  CM Consult, Follow-up appt scheduled  Post Acute Care Choice:  NA Choice offered to:  NA  DME Arranged:  N/A DME Agency:  NA  HH Arranged:  NA HH Agency:  NA  Status of Service:  Completed, signed off  If discussed at Long Length of Stay Meetings, dates discussed:    Additional Comments:  Epifanio Lesches, RN 03/04/2018, 12:43 PM

## 2018-03-04 NOTE — Progress Notes (Signed)
Pharmacy Antibiotic Note  Robert [name] is a 38 y.o. male admitted on 02/28/2018 with cellulitis.  Pharmacy has been consulted for vancomycin dosing.  Vancomycin drawn levels today are subtherapeutic  Plan: Increase Vancomycin 1250 mg IV q12h  Height: 5\' 5"  (165.1 cm) Weight: 200 lb (90.7 kg) IBW/kg (Calculated) : 61.5  Temp (24hrs), Avg:98.1 F (36.7 C), Min:97.4 F (36.3 C), Max:99.1 F (37.3 C)  Recent Labs  Lab 02/28/18 1045 03/01/18 0428 03/02/18 0836 03/03/18 0527 03/03/18 1513 03/03/18 2348  WBC 14.2* 9.0 8.1  --   --   --   CREATININE 0.84 0.95 0.78 0.80  --   --   VANCOTROUGH  --   --   --   --   --  8*  VANCOPEAK  --   --   --   --  24*  --     Estimated Creatinine Clearance: 130.9 mL/min (by C-G formula based on SCr of 0.8 mg/dL).    Allergies  Allergen Reactions  . Hydrocodone     nausea    Robert [name], Robert [name] 03/04/2018 12:33 AM

## 2018-03-04 NOTE — Discharge Instructions (Signed)
Thank you for allowing [place] to provide your care. Please take all your medications as prescribed. It is very important to control this infection. Please abstain from using any recreational drugs.  Cellulitis, Adult  Cellulitis is a skin infection. The infected area is often warm, red, swollen, and sore. It occurs most often in the arms and lower legs. It is very important to get treated for this condition. What are the causes? This condition is caused by bacteria. The bacteria enter through a break in the skin, such as a cut, burn, insect bite, open sore, or crack. What increases the risk? This condition is more likely to occur in people who:  Have a weak body defense system (immune system).  Have open cuts, burns, bites, or scrapes on the skin.  Are older than 38 years of age.  Have a blood sugar problem (diabetes).  Have a long-lasting (chronic) liver disease (cirrhosis) or kidney disease.  Are very overweight (obese).  Have a skin problem, such as: ? Itchy rash (eczema). ? Slow movement of blood in the veins (venous stasis). ? Fluid buildup below the skin (edema).  Have been treated with high-energy rays (radiation).  Use IV drugs. What are the signs or symptoms? Symptoms of this condition include:  Skin that is: ? Red. ? Streaking. ? Spotting. ? Swollen. ? Sore or painful when you touch it. ? Warm.  A fever.  Chills.  Blisters. How is this diagnosed? This condition is diagnosed based on:  Medical history.  Physical exam.  Blood tests.  Imaging tests. How is this treated? Treatment for this condition may include:  Medicines to treat infections or allergies.  Home care, such as: ? Rest. ? Placing cold or warm cloths (compresses) on the skin.  Hospital care, if the condition is very bad. Follow these instructions at home: Medicines  Take over-the-counter and prescription medicines only as told by your doctor.  If you were prescribed an antibiotic  medicine, take it as told by your doctor. Do not stop taking it even if you start to feel better. General instructions   Drink enough fluid to keep your pee (urine) pale yellow.  Do not touch or rub the infected area.  Raise (elevate) the infected area above the level of your heart while you are sitting or lying down.  Place cold or warm cloths on the area as told by your doctor.  Keep all follow-up visits as told by your doctor. This is important. Contact a doctor if:  You have a fever.  You do not start to get better after 1-2 days of treatment.  Your bone or joint under the infected area starts to hurt after the skin has healed.  Your infection comes back. This can happen in the same area or another area.  You have a swollen bump in the area.  You have new symptoms.  You feel ill and have muscle aches and pains. Get help right away if:  Your symptoms get worse.  You feel very sleepy.  You throw up (vomit) or have watery poop (diarrhea) for a long time.  You see red streaks coming from the area.  Your red area gets larger.  Your red area turns dark in color. These symptoms may represent a serious problem that is an emergency. Do not wait to see if the symptoms will go away. Get medical help right away. Call your local emergency services (911 in the [place] Do not drive yourself to the hospital. Summary  Cellulitis is a skin infection. The area is often warm, red, swollen, and sore.  This condition is treated with medicines, rest, and cold and warm cloths.  Take all medicines only as told by your doctor.  Tell your doctor if symptoms do not start to get better after 1-2 days of treatment. This information is not intended to replace advice given to you by your health care provider. Make sure you discuss any questions you have with your health care provider. Document Released: 07/31/2007 Document Revised: 07/03/2017 Document Reviewed: 07/03/2017 Elsevier Interactive  Patient Education  2019 [employer]

## 2018-03-04 NOTE — Discharge Summary (Addendum)
Name: Robert [name] MRN: [id number] DOB: 03/18/1980 37 y.o. PCP: Patient, No Pcp Per  Date of Admission: 02/28/2018  9:15 AM Date of Discharge: 03/04/2018 Attending Physician: Levert FeinsteinGranfortuna, James M, MD  Discharge Diagnosis: 1. Bilateral upper and lower extremity cellulitis 2. Polysubstance use disorder  Discharge Medications: Allergies as of 03/04/2018      Reactions   Hydrocodone    nausea      Medication List    TAKE these medications   doxycycline 50 MG capsule Commonly known as:  VIBRAMYCIN Take 2 capsules (100 mg total) by mouth 2 (two) times daily for 5 days.   DULoxetine 30 MG capsule Commonly known as:  CYMBALTA Take 1 capsule (30 mg total) by mouth daily.     Disposition and follow-up:   Mr.Robert [name] [facility] St[facility].  At the hospital follow up visit please address:  1. Polysubstance use disorder. Patient with heavy IV drug use, primarily stimulants. Please continue to discuss treatment options with the patient.  2.  Labs / imaging needed at time of follow-up: None  3.  Pending labs/ test needing follow-up: None  Follow-up Appointments: Follow-up Information    [facility] Follow up on 03/24/2018.   Why:  2:30 pm, Zelda Flemmings Contact information: [address] [place] [phone]        Hospital Course by problem list:  1. Bilateral upper and lower extremity cellulitis. Robert [name] is a 38 y.o malewith a history of substance abuse and depressionwho presented to the ED with bilateral upper and lower extremity swelling/pain. He was recently prescribed an antibiotic for a right arm abscess but did not complete the course. He noticed worsening swelling in his arms and legs and started to "drain the fluid" by sticking himself with needles. He was subsequently admitted for diffuse cellulitis with systemic symptoms. Throughout his  hospitalization he was treated with vancomycin and Zosyn. Blood cultures remained negative for approximately five days. Echocardiogram was pursued to rule out endocarditis that illustrated no vegetations. Given his extent of symptoms/cellulitis we discharged him on PO doxycycline. He will follow-up with [place] [facility] on 03/24/2018.  2. Polysubstance use disorder. The patient has used multiple substances since the age of 16andrecently switched to IV drugs about 9 months ago, but just recently started to use it heavily. Last used cocaine, heroin, and meth in the morning of admission. He has been to rehab facilities in the past and abstained from using drugs for several months however reports frequent relapses. We discussed treatment options including medication, facilities, and clinics. He stated that he had all available information and did not want any additional help. He was discharged with [facility].  Discharge Vitals:   BP 113/61 (BP Location: Right Arm)   Pulse 77   Temp 98.3 F (36.8 C) (Oral)   Resp 20   Ht 5\' 5"  (1.651 m)   Wt 90.7 kg   SpO2 97%   BMI 33.28 kg/m   Pertinent Labs, Studies, and Procedures:   Echocardiogram 03/02/18 Study Conclusions - Left ventricle: The cavity size was normal. Wall thickness was   normal. Systolic function was normal. The estimated ejection   fraction was in the range of 60% to 65%. Wall motion was normal;   there were no regional wall motion abnormalities. Left   ventricular diastolic function parameters were normal. - Tricuspid valve: There was trivial  regurgitation.  Impressions: - There was no evidence of a vegetation.  Results for orders placed or performed during the hospital encounter of 02/28/18  Blood culture (routine x 2)     Status: None (Preliminary result)   Collection Time: 02/28/18 10:02 AM  Result Value Ref Range Status   Specimen Description BLOOD BLOOD LEFT FOREARM  Final    Special Requests AEROBIC BOTTLE ONLY Blood Culture adequate volume  Final   Culture   Final    NO GROWTH 4 DAYS Performed at [facility], [address] [place], [place] [address]    Report Status PENDING  Incomplete  Blood culture (routine x 2)     Status: None (Preliminary result)   Collection Time: 02/28/18 10:07 AM  Result Value Ref Range Status   Specimen Description BLOOD LEFT ANTECUBITAL  Final   Special Requests   Final    BOTTLES DRAWN AEROBIC AND ANAEROBIC Blood Culture adequate volume   Culture   Final    NO GROWTH 4 DAYS Performed at [facility], [address], [place], [place] [address]    Report Status PENDING  Incomplete  MRSA PCR Screening     Status: None   Collection Time: 03/01/18 12:47 AM  Result Value Ref Range Status   MRSA by PCR NEGATIVE NEGATIVE Final    Comment:        The GeneXpert MRSA Assay (FDA approved for NASAL specimens only), is one component of a comprehensive MRSA colonization surveillance program. It is not intended to diagnose MRSA infection nor to guide or monitor treatment for MRSA infections. Performed at [facility], [address], [place], [place] [address]    Discharge Instructions: Discharge Instructions    Diet - low sodium heart healthy   Complete by:  As directed    Increase activity slowly   Complete by:  As directed      Signed: Levora Dredge, MD 03/04/2018, 11:21 AM

## 2018-03-05 LAB — CULTURE, BLOOD (ROUTINE X 2)
Culture: NO GROWTH
Culture: NO GROWTH
Special Requests: ADEQUATE
Special Requests: ADEQUATE

## 2018-03-24 ENCOUNTER — Inpatient Hospital Stay: Payer: Self-pay | Admitting: Nurse Practitioner

## 2018-09-26 DIAGNOSIS — L0390 Cellulitis, unspecified: Secondary | ICD-10-CM

## 2018-09-26 HISTORY — DX: Cellulitis, unspecified: L03.90

## 2018-10-19 ENCOUNTER — Inpatient Hospital Stay (HOSPITAL_COMMUNITY)
Admission: EM | Admit: 2018-10-19 | Discharge: 2018-10-23 | DRG: 603 | Disposition: A | Discharge status: Home / Self Care | Payer: Self-pay | Attending: Internal Medicine | Admitting: Internal Medicine

## 2018-10-19 ENCOUNTER — Encounter (HOSPITAL_COMMUNITY): Payer: Self-pay | Admitting: Emergency Medicine

## 2018-10-19 ENCOUNTER — Other Ambulatory Visit: Payer: Self-pay

## 2018-10-19 DIAGNOSIS — B9561 Methicillin susceptible Staphylococcus aureus infection as the cause of diseases classified elsewhere: Secondary | ICD-10-CM | POA: Diagnosis present

## 2018-10-19 DIAGNOSIS — L03114 Cellulitis of left upper limb: Secondary | ICD-10-CM | POA: Diagnosis present

## 2018-10-19 DIAGNOSIS — L02419 Cutaneous abscess of limb, unspecified: Secondary | ICD-10-CM | POA: Diagnosis present

## 2018-10-19 DIAGNOSIS — F419 Anxiety disorder, unspecified: Secondary | ICD-10-CM | POA: Diagnosis present

## 2018-10-19 DIAGNOSIS — F1320 Sedative, hypnotic or anxiolytic dependence, uncomplicated: Secondary | ICD-10-CM | POA: Diagnosis present

## 2018-10-19 DIAGNOSIS — L03113 Cellulitis of right upper limb: Secondary | ICD-10-CM | POA: Diagnosis present

## 2018-10-19 DIAGNOSIS — F1910 Other psychoactive substance abuse, uncomplicated: Secondary | ICD-10-CM | POA: Diagnosis present

## 2018-10-19 DIAGNOSIS — L03119 Cellulitis of unspecified part of limb: Secondary | ICD-10-CM

## 2018-10-19 DIAGNOSIS — F17210 Nicotine dependence, cigarettes, uncomplicated: Secondary | ICD-10-CM | POA: Diagnosis present

## 2018-10-19 DIAGNOSIS — F329 Major depressive disorder, single episode, unspecified: Secondary | ICD-10-CM | POA: Diagnosis present

## 2018-10-19 DIAGNOSIS — L0390 Cellulitis, unspecified: Secondary | ICD-10-CM

## 2018-10-19 DIAGNOSIS — F1990 Other psychoactive substance use, unspecified, uncomplicated: Secondary | ICD-10-CM

## 2018-10-19 DIAGNOSIS — Z20828 Contact with and (suspected) exposure to other viral communicable diseases: Secondary | ICD-10-CM | POA: Diagnosis present

## 2018-10-19 DIAGNOSIS — L03115 Cellulitis of right lower limb: Principal | ICD-10-CM | POA: Diagnosis present

## 2018-10-19 HISTORY — DX: Inflammatory liver disease, unspecified: K75.9

## 2018-10-19 LAB — COMPREHENSIVE METABOLIC PANEL
ALT: 44 U/L (ref 0–44)
AST: 46 U/L — ABNORMAL HIGH (ref 15–41)
Albumin: 3.6 g/dL (ref 3.5–5.0)
Alkaline Phosphatase: 92 U/L (ref 38–126)
Anion gap: 16 — ABNORMAL HIGH (ref 5–15)
BUN: 8 mg/dL (ref 6–20)
CO2: 21 mmol/L — ABNORMAL LOW (ref 22–32)
Calcium: 8.9 mg/dL (ref 8.9–10.3)
Chloride: 95 mmol/L — ABNORMAL LOW (ref 98–111)
Creatinine, Ser: 0.9 mg/dL (ref 0.61–1.24)
GFR calc Af Amer: >60 mL/min (ref >60)
GFR calc non Af Amer: >60 mL/min (ref >60)
Glucose, Bld: 108 mg/dL — ABNORMAL HIGH (ref 70–99)
Potassium: 4.1 mmol/L (ref 3.5–5.1)
Sodium: 132 mmol/L — ABNORMAL LOW (ref 135–145)
Total Bilirubin: 1.6 mg/dL — ABNORMAL HIGH (ref 0.3–1.2)
Total Protein: 7.3 g/dL (ref 6.5–8.1)

## 2018-10-19 LAB — LACTIC ACID, PLASMA: Lactic Acid, Venous: 1.4 mmol/L (ref 0.5–1.9)

## 2018-10-19 LAB — CBC WITH DIFFERENTIAL/PLATELET
Abs Immature Granulocytes: 0 10*3/uL (ref 0.00–0.07)
Basophils Absolute: 0.2 10*3/uL — ABNORMAL HIGH (ref 0.0–0.1)
Basophils Relative: 2 %
Eosinophils Absolute: 0.4 10*3/uL (ref 0.0–0.5)
Eosinophils Relative: 3 %
HCT: 41.3 % (ref 39.0–52.0)
Hemoglobin: 14.4 g/dL (ref 13.0–17.0)
Lymphocytes Relative: 13 %
Lymphs Abs: 1.6 10*3/uL (ref 0.7–4.0)
MCH: 31.1 pg (ref 26.0–34.0)
MCHC: 34.9 g/dL (ref 30.0–36.0)
MCV: 89.2 fL (ref 80.0–100.0)
Monocytes Absolute: 0.9 10*3/uL (ref 0.1–1.0)
Monocytes Relative: 7 %
Neutro Abs: 9.2 10*3/uL — ABNORMAL HIGH (ref 1.7–7.7)
Neutrophils Relative %: 75 %
Platelets: 260 10*3/uL (ref 150–400)
RBC: 4.63 MIL/uL (ref 4.22–5.81)
RDW: 12.1 % (ref 11.5–15.5)
WBC: 12.2 10*3/uL — ABNORMAL HIGH (ref 4.0–10.5)
nRBC: 0 % (ref 0.0–0.2)
nRBC: 0 /100 WBC

## 2018-10-19 LAB — SARS CORONAVIRUS 2 (TAT 6-24 HRS): SARS Coronavirus 2: NEGATIVE

## 2018-10-19 MED ORDER — ENOXAPARIN SODIUM 40 MG/0.4ML ~~LOC~~ SOLN
40.0000 mg | SUBCUTANEOUS | Status: DC
  Administered 2018-10-19 – 2018-10-23 (×5): 40 mg via SUBCUTANEOUS
  Filled 2018-10-19 (×5): qty 0.4

## 2018-10-19 MED ORDER — ADULT MULTIVITAMIN W/MINERALS CH
1.0000 | ORAL_TABLET | Freq: Every day | ORAL | Status: DC
  Administered 2018-10-19 – 2018-10-23 (×5): 1 via ORAL
  Filled 2018-10-19 (×5): qty 1

## 2018-10-19 MED ORDER — NICOTINE 21 MG/24HR TD PT24
21.0000 mg | MEDICATED_PATCH | Freq: Every day | TRANSDERMAL | Status: DC
  Administered 2018-10-20 – 2018-10-22 (×3): 21 mg via TRANSDERMAL
  Filled 2018-10-19 (×4): qty 1

## 2018-10-19 MED ORDER — SODIUM CHLORIDE 0.9% FLUSH: 3.0000 mL | Freq: Once | INTRAVENOUS | Status: DC

## 2018-10-19 MED ORDER — VITAMIN B-1 100 MG PO TABS
100.0000 mg | ORAL_TABLET | Freq: Every day | ORAL | Status: DC
  Administered 2018-10-19 – 2018-10-23 (×5): 100 mg via ORAL
  Filled 2018-10-19 (×5): qty 1

## 2018-10-19 MED ORDER — CEFAZOLIN SODIUM-DEXTROSE 1-4 GM/50ML-% IV SOLN
1.0000 g | Freq: Three times a day (TID) | INTRAVENOUS | Status: DC
  Administered 2018-10-19 – 2018-10-21 (×5): 1 g via INTRAVENOUS
  Filled 2018-10-19 (×5): qty 50

## 2018-10-19 MED ORDER — FOLIC ACID 1 MG PO TABS
1.0000 mg | ORAL_TABLET | Freq: Every day | ORAL | Status: DC
  Administered 2018-10-19 – 2018-10-23 (×5): 1 mg via ORAL
  Filled 2018-10-19 (×5): qty 1

## 2018-10-19 MED ORDER — THIAMINE HCL 100 MG/ML IJ SOLN: 100.0000 mg | Freq: Every day | INTRAMUSCULAR | Status: DC

## 2018-10-19 MED ORDER — WHITE PETROLATUM EX OINT
TOPICAL_OINTMENT | CUTANEOUS | Status: AC
  Administered 2018-10-19: 0.2
  Filled 2018-10-19: qty 28.35

## 2018-10-19 MED ORDER — CEFAZOLIN SODIUM-DEXTROSE 1-4 GM/50ML-% IV SOLN
1.0000 g | Freq: Once | INTRAVENOUS | Status: AC
  Administered 2018-10-19: 12:00:00 1 g via INTRAVENOUS
  Filled 2018-10-19: qty 50

## 2018-10-19 MED ORDER — DULOXETINE HCL 30 MG PO CPEP: 30.0000 mg | ORAL_CAPSULE | Freq: Every day | ORAL | Status: DC

## 2018-10-19 MED ORDER — DULOXETINE HCL 30 MG PO CPEP
30.0000 mg | ORAL_CAPSULE | Freq: Every day | ORAL | Status: DC
  Administered 2018-10-20 – 2018-10-23 (×4): 30 mg via ORAL
  Filled 2018-10-19 (×4): qty 1

## 2018-10-19 NOTE — ED Notes (Signed)
Second IV team unsuccessful

## 2018-10-19 NOTE — H&P (Signed)
Date: 10/19/2018               Patient Name:  Robert [name] MRN: [id number]  DOB: 09/10/1980 Age / Sex: 38 y.o., male   PCP: Patient, No Pcp Per         Medical Service: Internal Medicine Teaching Service         Attending Physician: Dr. Rebeca Alert, Raynaldo Opitz, MD    First Contact: Dr. Ladona Horns Pager: [phone]  Second Contact: Dr. Vickki Muff  Pager: [phone]       After Hours (After 5p/  First Contact Pager: [phone]  weekends / holidays): Second Contact Pager: [phone]   Chief Complaint: R leg swelling  History of Present Illness: Mr. Costilla is a 38 year old M with significant PMH of depression, anxiety, and polysubstance use, who presented to the ED for a few day history of R leg swelling. He endorses 1 week history of IV drug use with heroin, methamphetamines, and cocaine, injecting his arms and legs. Pt states that a few days ago he noticed redness, warmth, and swelling of his R leg. This has been worsening over the past day, spreading from his ankle/shin up to his knee. He had an episode like this in January where he was admitted and treated for cellulitis. States that he had abstained from drugs between an admission in February and 1 week ago. States that he hasn't slept in 6 days. He last use was at [facility] on 8/24. Says that a friend got clean needles from a needle exchange and he cleaned his injection sites the first time but not subsequent times. Pt said that he was going to come into the ED for detox tomorrow, but came in sooner because he felt his RLE symptoms were spreading more rapidly. Denies fevers, chills, nausea, abdominal pain, or dizziness. Endorses some purulent drainage from his R antecubital fossa.  In the ED, pt was febrile (38.1), non-tachycardic, BP 120s/70s, non-tachypnic, with O2 saturation of 96 on RA. Labs revealed WBC 12.2 with absolute neutrophils 9.2 and basophils 0.2. Lactate 1.5. Na 132, K 4.1, Cl 95, bicarb 21, Glc 108, BUN 8 , Cr 0.9, and Ca 8.9. AST 46, ALT  44, Alk phos 92, and T bili 1.6. Wound and blood cultures obtained. Given 1g cefazolin IV.   Meds:  No current facility-administered medications on file prior to encounter.    Current Outpatient Medications on File Prior to Encounter  Medication Sig Dispense Refill  . DULoxetine (CYMBALTA) 30 MG capsule Take 1 capsule (30 mg total) by mouth daily. 90 capsule 1   Allergies: Allergies as of 10/19/2018 - Review Complete 10/19/2018  Allergen Reaction Noted  . Hydrocodone  02/28/2018   Past Medical History:  Diagnosis Date  . Anxiety   . Arthritis   . Depression   . Hepatitis    via pts mother   Family History: Mother and grandfather with diabetes  Social History: Current smoker, 1 pack per day. Drinks 2 beers per day. Endorses heroin, cocaine, and methamphetamine use over the past week. Denies ever having withdrawal seizures. Was living at [facility]. Has not been working for the last 6 months.  Review of Systems: Review of Systems  Constitutional: Negative for chills and fever.  HENT: Negative for sore throat.   Eyes: Negative.   Respiratory: Negative for cough and shortness of breath.   Cardiovascular: Positive for leg swelling. Negative for chest pain.  Gastrointestinal: Negative for abdominal pain, nausea and vomiting.  Genitourinary: Negative for frequency and urgency.  Musculoskeletal: Negative for myalgias.  Skin:       Redness and warmth in arms and legs  Neurological: Negative for dizziness, weakness and headaches.    Physical Exam: Blood pressure 121/68, pulse 95, temperature 99 F (37.2 C), temperature source Oral, resp. rate 14, height _0  (1.651 m), weight 86.2 kg, SpO2 97 %. Physical Exam Vitals signs and nursing note reviewed.  Constitutional:      General: He is not in acute distress. HENT:     Head: Normocephalic and atraumatic.     Mouth/Throat:     Mouth: Mucous membranes are dry.  Eyes:     General: No scleral icterus. Cardiovascular:      Rate and Rhythm: Normal rate and regular rhythm.     Heart sounds: Normal heart sounds. No murmur. No friction rub. No gallop.   Pulmonary:     Effort: Pulmonary effort is normal. No tachypnea or respiratory distress.     Breath sounds: Normal breath sounds. No wheezing, rhonchi or rales.  Abdominal:     General: Abdomen is flat. Bowel sounds are normal. There is no distension.     Palpations: Abdomen is soft.     Tenderness: There is no abdominal tenderness.  Musculoskeletal:     Comments: Swelling of R and L lower extremities. RLE warm to touch with concentrated area of erythema at anterior lower shin. This area is tender to palpation.  Skin:    Comments: Areas of erythema and warm in bilateral upper extremities. R hand swollen in comparison to L. Punctate wound in R antecubital fossa weeping.  Neurological:     General: No focal deficit present.     Mental Status: He is easily aroused. He is lethargic.     Comments: Lethargic and nods off mid-sentence. Easily arousable to voice and able to answer questions before sleeping again.  Psychiatric:        Speech: Speech normal.        Cognition and Memory: Cognition is impaired.    Assessment & Plan by Problem: Mr. Severin is a 38 year old M with significant PMH of depression, anxiety, and polysubstance use, who presented with bilateral UE and R LE swelling, erythema, and warm, a fever, and leukocytosis concerning for non-purulent cellulitis.   Non purulent Cellulitis Febrile with leukocytosis, but hemodynamically stable at this time. Pt drowsy on exam, but awaken to voice and is fully alert and responsive, will benefit from rest. - continue cefazolin 1g IV q8h - f/u MRSA screening - f/u blood cultures  - f/u wound cultures - BMP and CBC in AM   Anxiety/depression - continue duloxetine 68m PO daily   Tobacco Use Disorder - nicotine patch   Diet - regular Fluids - none DVT ppx - enoxaparin 447msubQ daily CODE STATUS - FULL  CODE   Dispo: Admit patient to Observation with expected length of stay less than 2 midnights.  Signed: JoLadona HornsMD 10/19/2018, 2:37 PM  Pager: [phone]

## 2018-10-19 NOTE — ED Triage Notes (Signed)
Pt c/o cellulitis x 3 days in several areas. Redness noted to R lower leg with increased pain. Has had chills and upset stomach as well

## 2018-10-19 NOTE — Progress Notes (Signed)
Patient gave consent for RN to speak with mother Deon Pilling at [phone] about current status. Update given & questions answered. Patient has been very sleepy and states that this is due to him not sleeping for 6 days.

## 2018-10-19 NOTE — ED Notes (Signed)
ED TO INPATIENT HANDOFF REPORT  ED Nurse Name and Phone #: Selena BattenKim [phone]  S Name/Age/Gender Robert [name] 38 y.o. male Room/Bed: 034C/034C  Code Status   Code Status: Full Code  Home/SNF/Other Home Patient oriented to: self, place, time and situation Is this baseline? Yes   Triage Complete: Triage complete  Chief Complaint cellulitis   Triage Note Pt c/o cellulitis x 3 days in several areas. Redness noted to R lower leg with increased pain. Has had chills and upset stomach as well    Allergies Allergies  Allergen Reactions  . Hydrocodone     nausea    Level of Care/Admitting Diagnosis ED Disposition    ED Disposition Condition Comment   Admit  [facility] [[id number]]  Level of Care: Med-Surg [16]  Covid Evaluation: Asymptomatic Screening Protocol (No Symptoms)  Diagnosis: Cellulitis [960454][192319]  Admitting Physician: Anne ShutterAINES, ALEXANDER N [[id number]]  Attending Physician: Anne ShutterAINES, ALEXANDER N [[id number]]  PT Class (Do Not Modify): Observation [104]  PT Acc Code (Do Not Modify): Observation [10022]       B Medical/Surgery History Past Medical History:  Diagnosis Date  . Anxiety   . Arthritis   . Depression   . Hepatitis    via pts mother   Past Surgical History:  Procedure Laterality Date  . GALLBLADDER SURGERY       A IV Location/Drains/Wounds Patient Lines/Drains/Airways Status   Active Line/Drains/Airways    Name:   Placement date:   Placement time:   Site:   Days:   Peripheral IV 10/19/18 Right;Upper Arm   10/19/18    1219    Arm   less than 1          Intake/Output Last 24 hours  Intake/Output Summary (Last 24 hours) at 10/19/2018 1419 Last data filed at 10/19/2018 1255 Gross per 24 hour  Intake 49.92 ml  Output -  Net 49.92 ml    Labs/Imaging Results for orders placed or performed during the hospital encounter of 10/19/18 (from the past 48 hour(s))  Lactic acid, plasma     Status: None   Collection Time: 10/19/18  10:12 AM  Result Value Ref Range   Lactic Acid, Venous 1.4 0.5 - 1.9 mmol/L    Comment: Performed at [facility], [address] [place], [place] [address]  Comprehensive metabolic panel     Status: Abnormal   Collection Time: 10/19/18 10:12 AM  Result Value Ref Range   Sodium 132 (L) 135 - 145 mmol/L   Potassium 4.1 3.5 - 5.1 mmol/L   Chloride 95 (L) 98 - 111 mmol/L   CO2 21 (L) 22 - 32 mmol/L   Glucose, Bld 108 (H) 70 - 99 mg/dL   BUN 8 6 - 20 mg/dL   Creatinine, Ser 6.570.90 0.61 - 1.24 mg/dL   Calcium 8.9 8.9 - 84.610.3 mg/dL   Total Protein 7.3 6.5 - 8.1 g/dL   Albumin 3.6 3.5 - 5.0 g/dL   AST 46 (H) 15 - 41 U/L   ALT 44 0 - 44 U/L   Alkaline Phosphatase 92 38 - 126 U/L   Total Bilirubin 1.6 (H) 0.3 - 1.2 mg/dL   GFR calc non Af Amer >60 >60 mL/min   GFR calc Af Amer >60 >60 mL/min   Anion gap 16 (H) 5 - 15    Comment: Performed at [facility], [address] [place], [place] [address]  CBC with Differential     Status: Abnormal  Collection Time: 10/19/18 12:23 PM  Result Value Ref Range   WBC 12.2 (H) 4.0 - 10.5 K/uL   RBC 4.63 4.22 - 5.81 MIL/uL   Hemoglobin 14.4 13.0 - 17.0 g/dL   HCT 16.141.3 09.639.0 - 04.552.0 %   MCV 89.2 80.0 - 100.0 fL   MCH 31.1 26.0 - 34.0 pg   MCHC 34.9 30.0 - 36.0 g/dL   RDW 40.912.1 81.111.5 - 91.415.5 %   Platelets 260 150 - 400 K/uL    Comment: REPEATED TO VERIFY   nRBC 0.0 0.0 - 0.2 %   Neutrophils Relative % 75 %   Neutro Abs 9.2 (H) 1.7 - 7.7 K/uL   Lymphocytes Relative 13 %   Lymphs Abs 1.6 0.7 - 4.0 K/uL   Monocytes Relative 7 %   Monocytes Absolute 0.9 0.1 - 1.0 K/uL   Eosinophils Relative 3 %   Eosinophils Absolute 0.4 0.0 - 0.5 K/uL   Basophils Relative 2 %   Basophils Absolute 0.2 (H) 0.0 - 0.1 K/uL   nRBC 0 0 /100 WBC   Abs Immature Granulocytes 0.00 0.00 - 0.07 K/uL    Comment: Performed at [facility], [address] [place], [place] [address]   No results found.  Pending Labs Unresulted Labs (From admission, onward)     Start     Ordered   10/26/18 0500  Creatinine, serum  (enoxaparin (LOVENOX)    CrCl >/= 30 ml/min)  Weekly,   R    Comments: while on enoxaparin therapy    10/19/18 1414   10/20/18 0500  CBC  Tomorrow morning,   R     10/19/18 1414   10/19/18 1413  CBC  (enoxaparin (LOVENOX)    CrCl >/= 30 ml/min)  Once,   STAT    Comments: Baseline for enoxaparin therapy IF NOT ALREADY DRAWN.  Notify MD if PLT < 100 K.    10/19/18 1414   10/19/18 1413  Creatinine, serum  (enoxaparin (LOVENOX)    CrCl >/= 30 ml/min)  Once,   STAT    Comments: Baseline for enoxaparin therapy IF NOT ALREADY DRAWN.    10/19/18 1414   10/19/18 1356  MRSA PCR Screening  Once,   STAT     10/19/18 1355   10/19/18 1006  CBC with Differential  ONCE - STAT,   STAT     10/19/18 1005   10/19/18 0952  SARS CORONAVIRUS 2 Nasal Swab Aptima Multi Swab  (Asymptomatic/Tier 2 Patients Labs)  Once,   STAT    Question Answer Comment  Is this test for diagnosis or screening Screening   Symptomatic for COVID-19 as defined by CDC No   Hospitalized for COVID-19 No   Admitted to ICU for COVID-19 No   Previously tested for COVID-19 No   Resident in a congregate (group) care setting No   Employed in healthcare setting No      10/19/18 0951   10/19/18 0915  Culture, blood (routine x 2)  BLOOD CULTURE X 2,   STAT     10/19/18 0914   10/19/18 0915  Wound or Superficial Culture  Once,   STAT    Comments: [facility]    10/19/18 0914   10/19/18 0552  CBC with Differential  ONCE - STAT,   STAT     10/19/18 0551          Vitals/Pain Today's Vitals   10/19/18 1315 10/19/18 1330 10/19/18 1336 10/19/18 1345  BP: 113/74 122/77  121/68  Pulse: 94 94 93 95  Resp:    14  Temp:      TempSrc:      SpO2: 97% 100% 96% 97%  Weight:      Height:      PainSc:        Isolation Precautions No active isolations  Medications Medications  sodium chloride flush (NS) 0.9 % injection 3 mL (3 mLs Intravenous Not Given 10/19/18  0918)  DULoxetine (CYMBALTA) DR capsule 30 mg (has no administration in time range)  enoxaparin (LOVENOX) injection 40 mg (has no administration in time range)  ceFAZolin (ANCEF) IVPB 1 g/50 mL premix (0 g Intravenous Stopped 10/19/18 1255)    Mobility walks Moderate fall risk    R Recommendations: See Admitting Provider Note  Report given to:   Additional Notes:

## 2018-10-19 NOTE — ED Notes (Signed)
Pt has multiple reddened areas over both arms and legs-- has used arms and legs to inject IV drugs-- has a weeping wound in right AC.

## 2018-10-19 NOTE — ED Notes (Signed)
IV team unsuccessful for access. Sts will send another team to try.

## 2018-10-19 NOTE — ED Provider Notes (Signed)
[facility] EMERGENCY DEPARTMENT Provider Note   CSN: [id number] Arrival date & time: 10/19/18  [id number]     History   Chief Complaint Chief Complaint  Patient presents with  . Cellulitis    HPI Robert [name] is a 38 y.o. male with PMHx anxiety, depression, and polysubstance abuse who presents to the ED today complaining of gradual onset, constant, worsening, swelling/pain/redness to bilateral arms and bilateral legs x 1 week. Pt endorses he recently started using IV drugs again - endorses meth, cocaine, and heroine that he injects into his arms and legs. States that this has happened to him in the past and he had to be admitted at that time. Pt last used at approximately 3 AM this morning. He is unable to provide further details - states he is unsure if he has had fevers or chills lately.        Past Medical History:  Diagnosis Date  . Anxiety   . Arthritis   . Depression   . Hepatitis    via pts mother    Patient Active Problem List   Diagnosis Date Noted  . Cellulitis of leg, right   . Cellulitis 02/28/2018  . Cocaine use disorder, moderate, dependence (HCC) 07/07/2017  . 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 Substance induced mood disorder (HCC) 03/08/2017  . Alcoholism (HCC) 09/12/2014    Past Surgical History:  Procedure Laterality Date  . GALLBLADDER SURGERY          Home Medications    Prior to Admission medications   Medication Sig Start Date End Date Taking? Authorizing Provider  DULoxetine (CYMBALTA) 30 MG capsule Take 1 capsule (30 mg total) by mouth daily. 03/04/18  Yes Levora DredgeHelberg, Justin, MD    Family History Family History  Problem Relation Age of Onset  . Diabetes Mother   . Diabetes Maternal Grandfather     Social History Social History   Tobacco Use  . Smoking status: Current Every Day  Smoker    Types: Cigarettes  . Smokeless tobacco: Never Used  Substance Use Topics  . Alcohol use: Yes    Alcohol/week: 0.0 standard drinks  . Drug use: Yes    Types: Cocaine, Heroin, Methamphetamines     Allergies   Hydrocodone   Review of Systems Review of Systems  Musculoskeletal: Positive for arthralgias and joint swelling.  Skin: Positive for color change.  All other systems reviewed and are negative.    Physical Exam Updated Vital Signs BP (!) 150/86 (BP Location: Right Arm)   Pulse 99   Temp 99.1 F (37.3 C) (Oral)   Resp 17   Ht 5\' 5"  (1.651 m)   Wt 86.2 kg   SpO2 99%   BMI 31.62 kg/m   Physical Exam Vitals signs and nursing note reviewed.  Constitutional:      Appearance: He is not ill-appearing.  HENT:     Head: Normocephalic and atraumatic.  Eyes:     Conjunctiva/sclera: Conjunctivae normal.  Neck:     Musculoskeletal: Neck supple.  Cardiovascular:     Rate and Rhythm: Normal rate and regular rhythm.     Pulses: Normal pulses.  Pulmonary:     Effort: Pulmonary effort is normal.     Breath sounds: Normal breath sounds. No wheezing, rhonchi or rales.  Abdominal:     Palpations: Abdomen is soft.     Tenderness: There  is no abdominal tenderness. There is no guarding or rebound.  Musculoskeletal:     Comments: See photos below.   Right arm with open wound to antecubital fossa; surrounding erythema and edema noted from right elbow down to hand with increased warm and TTP. Left arm and right lower extremity with similar findings consistent with cellulitis. 2+ radial and DP pulses bilaterally.   Skin:    General: Skin is warm and dry.  Neurological:     Mental Status: He is alert.      ED Treatments / Results  Labs (all labs ordered are listed, but only abnormal results are displayed) Labs Reviewed  COMPREHENSIVE METABOLIC PANEL - Abnormal; Notable for the following components:      Result Value   Sodium 132 (*)    Chloride 95 (*)    CO2  21 (*)    Glucose, Bld 108 (*)    AST 46 (*)    Total Bilirubin 1.6 (*)    Anion gap 16 (*)    All other components within normal limits  CBC WITH DIFFERENTIAL/PLATELET - Abnormal; Notable for the following components:   WBC 12.2 (*)    Neutro Abs 9.2 (*)    Basophils Absolute 0.2 (*)    All other components within normal limits  CULTURE, BLOOD (ROUTINE X 2)  CULTURE, BLOOD (ROUTINE X 2)  AEROBIC CULTURE (SUPERFICIAL SPECIMEN)  SARS CORONAVIRUS 2  LACTIC ACID, PLASMA  CBC WITH DIFFERENTIAL/PLATELET  CBC WITH DIFFERENTIAL/PLATELET    EKG None  Radiology No results found.  Procedures Procedures (including critical care time)  Medications Ordered in ED Medications  sodium chloride flush (NS) 0.9 % injection 3 mL (3 mLs Intravenous Not Given 10/19/18 0918)  ceFAZolin (ANCEF) IVPB 1 g/50 mL premix (0 g Intravenous Stopped 10/19/18 1255)             Initial Impression / Assessment and Plan / ED Course  I have reviewed the triage vital signs and the nursing notes.  Pertinent labs & imaging results that were available during my care of the patient were reviewed by me and considered in my medical decision making (see chart for details).  Clinical Course as of Oct 18 1353  Mon Oct 19, 2018  1100 Lactic Acid, Venous: 1.4 [MV]    Clinical Course User Index [MV] Tanda RockersVenter, Christiane Sistare, [place]-C   38 year old male who presents with cellulitis to bilateral upper and lower extremities x1 week.  Endorses IV drug use.  States around 3 AM today.  Patient unable to say whether he has had a fever or chills at home.  His temp today is 99.  Mildly tachycardic at 103.  No tachypnea.  Patient does have open wound to the right antecubital fossa.  States that this is been present for the last week.  No drainage.  Will obtain wound culture.  Blood cultures, CBC, CMP, lactic acid also obtained.  To be started on cefazolin.  He will need to be admitted.   Normal lactic acid.  White blood cell count  elevated at 12,000.  Patient does have a mild hyponatremia at 132 otherwise no electrolyte abnormalities.  Discussed case with hospitalist who agrees to evaluate patient for admission.      Final Clinical Impressions(s) / ED Diagnoses   Final diagnoses:  Cellulitis, unspecified cellulitis site  IV drug user    ED Discharge Orders    None       Tanda RockersVenter, Rasool Rommel, PA-C 10/19/18 1540    Melene PlanFloyd, Dan,  DO 10/21/18 1508

## 2018-10-20 ENCOUNTER — Inpatient Hospital Stay (HOSPITAL_COMMUNITY): Payer: Self-pay

## 2018-10-20 ENCOUNTER — Encounter (HOSPITAL_COMMUNITY): Payer: Self-pay

## 2018-10-20 DIAGNOSIS — Z885 Allergy status to narcotic agent status: Secondary | ICD-10-CM

## 2018-10-20 DIAGNOSIS — F1590 Other stimulant use, unspecified, uncomplicated: Secondary | ICD-10-CM

## 2018-10-20 DIAGNOSIS — L03115 Cellulitis of right lower limb: Principal | ICD-10-CM

## 2018-10-20 DIAGNOSIS — L03114 Cellulitis of left upper limb: Secondary | ICD-10-CM

## 2018-10-20 DIAGNOSIS — F329 Major depressive disorder, single episode, unspecified: Secondary | ICD-10-CM

## 2018-10-20 DIAGNOSIS — L03113 Cellulitis of right upper limb: Secondary | ICD-10-CM

## 2018-10-20 DIAGNOSIS — Z872 Personal history of diseases of the skin and subcutaneous tissue: Secondary | ICD-10-CM

## 2018-10-20 DIAGNOSIS — F1190 Opioid use, unspecified, uncomplicated: Secondary | ICD-10-CM

## 2018-10-20 DIAGNOSIS — F17210 Nicotine dependence, cigarettes, uncomplicated: Secondary | ICD-10-CM

## 2018-10-20 DIAGNOSIS — F419 Anxiety disorder, unspecified: Secondary | ICD-10-CM

## 2018-10-20 LAB — BASIC METABOLIC PANEL
Anion gap: 11 (ref 5–15)
BUN: 5 mg/dL — ABNORMAL LOW (ref 6–20)
CO2: 27 mmol/L (ref 22–32)
Calcium: 8.7 mg/dL — ABNORMAL LOW (ref 8.9–10.3)
Chloride: 95 mmol/L — ABNORMAL LOW (ref 98–111)
Creatinine, Ser: 0.82 mg/dL (ref 0.61–1.24)
GFR calc Af Amer: >60 mL/min (ref >60)
GFR calc non Af Amer: >60 mL/min (ref >60)
Glucose, Bld: 123 mg/dL — ABNORMAL HIGH (ref 70–99)
Potassium: 2.9 mmol/L — ABNORMAL LOW (ref 3.5–5.1)
Sodium: 133 mmol/L — ABNORMAL LOW (ref 135–145)

## 2018-10-20 LAB — MAGNESIUM: Magnesium: 2 mg/dL (ref 1.7–2.4)

## 2018-10-20 LAB — CBC
HCT: 40.6 % (ref 39.0–52.0)
Hemoglobin: 14.3 g/dL (ref 13.0–17.0)
MCH: 31.4 pg (ref 26.0–34.0)
MCHC: 35.2 g/dL (ref 30.0–36.0)
MCV: 89.2 fL (ref 80.0–100.0)
Platelets: 280 10*3/uL (ref 150–400)
RBC: 4.55 MIL/uL (ref 4.22–5.81)
RDW: 12.1 % (ref 11.5–15.5)
WBC: 9.9 10*3/uL (ref 4.0–10.5)
nRBC: 0 % (ref 0.0–0.2)

## 2018-10-20 LAB — MRSA PCR SCREENING: MRSA by PCR: NEGATIVE

## 2018-10-20 LAB — C-REACTIVE PROTEIN: CRP: 14 mg/dL — ABNORMAL HIGH (ref <1.0)

## 2018-10-20 LAB — CK: Total CK: 117 U/L (ref 49–397)

## 2018-10-20 MED ORDER — POTASSIUM CHLORIDE CRYS ER 20 MEQ PO TBCR: 40.0000 meq | EXTENDED_RELEASE_TABLET | Freq: Two times a day (BID) | ORAL | Status: DC

## 2018-10-20 MED ORDER — POTASSIUM CHLORIDE CRYS ER 20 MEQ PO TBCR
40.0000 meq | EXTENDED_RELEASE_TABLET | Freq: Two times a day (BID) | ORAL | Status: AC
  Administered 2018-10-20 (×2): 40 meq via ORAL
  Filled 2018-10-20 (×2): qty 2

## 2018-10-20 MED ORDER — FENTANYL CITRATE (PF) 100 MCG/2ML IJ SOLN
25.0000 ug | Freq: Once | INTRAMUSCULAR | Status: AC
  Administered 2018-10-20: 25 ug via INTRAVENOUS
  Filled 2018-10-20: qty 2

## 2018-10-20 NOTE — Progress Notes (Signed)
Had a call from pt.'s mom asking if pt.'s showing  suicidal thoughts; this writer told her that he's been calm and cooperative with the staff this shift and did not show any signs of being "suicidal".

## 2018-10-20 NOTE — Progress Notes (Addendum)
   Subjective: Pt seen at the bedside this morning. Lethargic but responsive to questioning. Endorses continued pain at the sites in his bilateral UE and RLE. Denies fevers, chills, or nausea.  Objective:  Vital signs in last 24 hours: Vitals:   10/19/18 1345 10/19/18 1504 10/19/18 1954 10/20/18 0510  BP: 121/68 118/75 117/79 111/63  Pulse: 95 97 95 90  Resp: 14 18 18 20   Temp:  (!) 100.5 F (38.1 C) 98.3 F (36.8 C) 99 F (37.2 C)  TempSrc:  Oral Oral Oral  SpO2: 97% 96% 100% 95%  Weight:      Height:      Physical Exam Vitals signs and nursing note reviewed.  Constitutional:      General: He is not in acute distress.    Appearance: He is not ill-appearing.  Cardiovascular:     Rate and Rhythm: Normal rate and regular rhythm.     Heart sounds: Normal heart sounds. No murmur. No friction rub. No gallop.   Pulmonary:     Effort: Pulmonary effort is normal. No tachypnea or respiratory distress.     Breath sounds: Normal breath sounds.  Abdominal:     General: Abdomen is flat. Bowel sounds are normal. There is no distension.  Musculoskeletal:     Right lower leg: No edema.     Left lower leg: No edema.  Skin:    Comments: RUE appears more swollen and tense than yesterday. ROM and pulses intact. Pt endorses some decreased sensation on the dorsal aspect of his hand. Sites of erythema on RLE and LUE improved from yesterday though remain tender to palpation.  Neurological:     Mental Status: He is easily aroused. He is lethargic.    Assessment/Plan: Mr. Daisey is a 38 year old M with significant PMH of depression, anxiety, and polysubstance use, who presented with bilateral UE and R LE swelling, erythema, and warmth, a fever, and leukocytosis concerning for cellulitis.     Purulent Cellulitis - Leukocytosis resolved, hemodynamically stable at this time. LUE and RLE cellulitis locations less erythematous and improved today. RUE appears more swollen and tense today. Punctate wound  in R AC intermittently draining. Informal bedside ultrasound revealed underlying fluid collection. - CK to evaluate for compartment syndrome - normal 117 - orthopedics consulted for potential need for I&D - continue cefazolin 1g IV q8h - MRSA screening negative - blood cultures no growth in <24hr - wound culture with abundant Staph aureus, sensitivities will help guide therapy - BMP and CBC in AM     Anxiety/depression - continue duloxetine 30mg  PO daily     Tobacco Use Disorder - nicotine patch     Diet - regular Fluids - none DVT ppx - enoxaparin 40mg  subQ daily CODE STATUS - FULL CODE    Dispo: Anticipated discharge in approximately 2-3 day(s).   Ladona Horns, MD 10/20/2018, 6:58 AM Pager: [phone]

## 2018-10-20 NOTE — Plan of Care (Signed)

## 2018-10-20 NOTE — Consult Note (Signed)
Reason for Consult:RUE abscess Referring Physician: A Quoc [name] is an 38 y.o. male.  HPI: Robert [name] was admitted yesterday for detox. He has been injecting multiple substances for the past week. Prior to that he was clean for 6+ months. Robert couple of days ago he began to have pain in his right arm and right leg with swelling. He was febrile on admission. He has had prior e/o similar bouts of cellulitis. The medical team performed Robert bedside US and though there was Robert fluid collection near the [facility] fossa and orthopedic surgery was consulted. He is RHD.  Past Medical History:  Diagnosis Date  . Anxiety   . Arthritis   . Cellulitis 09/2018  . Depression   . Hepatitis    via pts mother    Past Surgical History:  Procedure Laterality Date  . GALLBLADDER SURGERY      Family History  Problem Relation Age of Onset  . Diabetes Mother   . Diabetes Maternal Grandfather     Social History:  reports that he has been smoking cigarettes. He has never used smokeless tobacco. He reports current alcohol use. He reports current drug use. Drugs: Cocaine, Heroin, and Methamphetamines.  Allergies:  Allergies  Allergen Reactions  . Hydrocodone     nausea    Medications: I have reviewed the patient's current medications.  Results for orders placed or performed during the hospital encounter of 10/19/18 (from the past 48 hour(s))  SARS CORONAVIRUS 2 Nasal Swab Aptima Multi Swab     Status: None   Collection Time: 10/19/18  9:59 AM   Specimen: Aptima Multi Swab; Nasal Swab  Result Value Ref Range   SARS Coronavirus 2 NEGATIVE NEGATIVE    Comment: (NOTE) SARS-CoV-2 target nucleic acids are NOT DETECTED. The SARS-CoV-2 RNA is generally detectable in upper and lower respiratory specimens during the acute phase of infection. Negative results do not preclude SARS-CoV-2 infection, do not rule out co-infections with other pathogens, and should not be used as the sole basis for treatment or other  patient management decisions. Negative results must be combined with clinical observations, patient history, and epidemiological information. The expected result is Negative. Fact Sheet for Patients: SugarRoll.be Fact Sheet for Healthcare Providers: https://www.woods-mathews.com/ This test is not yet approved or cleared by the [place] FDA and  has been authorized for detection and/or diagnosis of SARS-CoV-2 by FDA under an Emergency Use Authorization (EUA). This EUA will remain  in effect (meaning this test can be used) for the duration of the COVID-19 declaration under Section 56 4(b)(1) of the Act, 21 U.S.C. section 360bbb-3(b)(1), unless the authorization is terminated or revoked sooner. Performed at [facility], [place] [address] [place], [place] [address]   Culture, blood (routine x 2)     Status: None (Preliminary result)   Collection Time: 10/19/18 10:00 AM   Specimen: BLOOD LEFT FOREARM  Result Value Ref Range   Specimen Description BLOOD LEFT FOREARM    Special Requests      AEROBIC BOTTLE ONLY Blood Culture results may not be optimal due to an inadequate volume of blood received in culture bottles   Culture      NO GROWTH < 24 HOURS Performed at [facility], [place] [address] [place], [place] [address]    Report Status PENDING   Wound or Superficial Culture     Status: None (Preliminary result)   Collection Time: 10/19/18 10:01 AM   Specimen: Wound  Result Value Ref  Range   Specimen Description WOUND RIGHT AC    Special Requests NONE    Gram Stain      RARE WBC PRESENT, PREDOMINANTLY PMN FEW GRAM POSITIVE COCCI IN PAIRS Performed at [facility], [address] [place], [place] [address]    Culture ABUNDANT STAPHYLOCOCCUS AUREUS    Report Status PENDING   Culture, blood (routine x 2)     Status: None (Preliminary result)   Collection Time: 10/19/18 10:10 AM   Specimen: BLOOD  Result Value Ref Range    Specimen Description BLOOD LEFT ANTECUBITAL    Special Requests      AEROBIC BOTTLE ONLY Blood Culture results may not be optimal due to an inadequate volume of blood received in culture bottles   Culture      NO GROWTH < 24 HOURS Performed at [facility], [address] [place], [place] [address]    Report Status PENDING   Lactic acid, plasma     Status: None   Collection Time: 10/19/18 10:12 AM  Result Value Ref Range   Lactic Acid, Venous 1.4 0.5 - 1.9 mmol/L    Comment: Performed at [facility], [address] [place], [place] [address]  Comprehensive metabolic panel     Status: Abnormal   Collection Time: 10/19/18 10:12 AM  Result Value Ref Range   Sodium 132 (L) 135 - 145 mmol/L   Potassium 4.1 3.5 - 5.1 mmol/L   Chloride 95 (L) 98 - 111 mmol/L   CO2 21 (L) 22 - 32 mmol/L   Glucose, Bld 108 (H) 70 - 99 mg/dL   BUN 8 6 - 20 mg/dL   Creatinine, Ser 2.130.90 0.61 - 1.24 mg/dL   Calcium 8.9 8.9 - 08.610.3 mg/dL   Total Protein 7.3 6.5 - 8.1 g/dL   Albumin 3.6 3.5 - 5.0 g/dL   AST 46 (H) 15 - 41 U/L   ALT 44 0 - 44 U/L   Alkaline Phosphatase 92 38 - 126 U/L   Total Bilirubin 1.6 (H) 0.3 - 1.2 mg/dL   GFR calc non Af Amer >60 >60 mL/min   GFR calc Af Amer >60 >60 mL/min   Anion gap 16 (H) 5 - 15    Comment: Performed at [facility], [address] [place], [place] [address]  CBC with Differential     Status: Abnormal   Collection Time: 10/19/18 12:23 PM  Result Value Ref Range   WBC 12.2 (H) 4.0 - 10.5 K/uL   RBC 4.63 4.22 - 5.81 MIL/uL   Hemoglobin 14.4 13.0 - 17.0 g/dL   HCT 96.241.3 95.239.0 - 84.152.0 %   MCV 89.2 80.0 - 100.0 fL   MCH 31.1 26.0 - 34.0 pg   MCHC 34.9 30.0 - 36.0 g/dL   RDW 32.412.1 40.111.5 - 02.715.5 %   Platelets 260 150 - 400 K/uL    Comment: REPEATED TO VERIFY   nRBC 0.0 0.0 - 0.2 %   Neutrophils Relative % 75 %   Neutro Abs 9.2 (H) 1.7 - 7.7 K/uL   Lymphocytes Relative 13 %   Lymphs Abs 1.6 0.7 - 4.0 K/uL   Monocytes Relative 7 %   Monocytes  Absolute 0.9 0.1 - 1.0 K/uL   Eosinophils Relative 3 %   Eosinophils Absolute 0.4 0.0 - 0.5 K/uL   Basophils Relative 2 %   Basophils Absolute 0.2 (H) 0.0 - 0.1 K/uL   nRBC 0 0 /100 WBC  Abs Immature Granulocytes 0.00 0.00 - 0.07 K/uL    Comment: Performed at [facility], [address] [place], [place] [address]  MRSA PCR Screening     Status: None   Collection Time: 10/19/18  1:56 PM   Specimen: Nasopharyngeal  Result Value Ref Range   MRSA by PCR NEGATIVE NEGATIVE    Comment:        The GeneXpert MRSA Assay (FDA approved for NASAL specimens only), is one component of Robert comprehensive MRSA colonization surveillance program. It is not intended to diagnose MRSA infection nor to guide or monitor treatment for MRSA infections. Performed at [facility], [address] [place], [place] [address]   CBC     Status: None   Collection Time: 10/20/18  2:59 AM  Result Value Ref Range   WBC 9.9 4.0 - 10.5 K/uL   RBC 4.55 4.22 - 5.81 MIL/uL   Hemoglobin 14.3 13.0 - 17.0 g/dL   HCT 09.840.6 11.939.0 - 14.752.0 %   MCV 89.2 80.0 - 100.0 fL   MCH 31.4 26.0 - 34.0 pg   MCHC 35.2 30.0 - 36.0 g/dL   RDW 82.912.1 56.211.5 - 13.015.5 %   Platelets 280 150 - 400 K/uL   nRBC 0.0 0.0 - 0.2 %    Comment: Performed at [facility], [address] [place], [place] [address]  Basic metabolic panel     Status: Abnormal   Collection Time: 10/20/18  2:59 AM  Result Value Ref Range   Sodium 133 (L) 135 - 145 mmol/L   Potassium 2.9 (L) 3.5 - 5.1 mmol/L    Comment: DELTA CHECK NOTED   Chloride 95 (L) 98 - 111 mmol/L   CO2 27 22 - 32 mmol/L   Glucose, Bld 123 (H) 70 - 99 mg/dL   BUN 5 (L) 6 - 20 mg/dL   Creatinine, Ser 4.690.82 0.61 - 1.24 mg/dL   Calcium 8.7 (L) 8.9 - 10.3 mg/dL   GFR calc non Af Amer >60 >60 mL/min   GFR calc Af Amer >60 >60 mL/min   Anion gap 11 5 - 15    Comment: Performed at [facility], [address] [place], [place] [address]  Magnesium     Status: None   Collection  Time: 10/20/18  2:59 AM  Result Value Ref Range   Magnesium 2.0 1.7 - 2.4 mg/dL    Comment: Performed at [facility], [address] [place], [place] [address]  CK     Status: None   Collection Time: 10/20/18  2:59 AM  Result Value Ref Range   Total CK 117 49 - 397 U/L    Comment: Performed at [facility], [address] [place], [place] [address]    No results found.  Review of Systems  Constitutional: Positive for chills and fever. Negative for weight loss.  HENT: Negative for ear discharge, ear pain, hearing loss and tinnitus.   Eyes: Negative for blurred vision, double vision, photophobia and pain.  Respiratory: Negative for cough, sputum production and shortness of breath.   Cardiovascular: Negative for chest pain.  Gastrointestinal: Positive for nausea. Negative for abdominal pain and vomiting.  Genitourinary: Negative for dysuria, flank pain, frequency and urgency.  Musculoskeletal: Positive for joint pain (RUE, RLE). Negative for back pain, falls, myalgias and neck pain.  Neurological: Negative for dizziness, tingling, sensory change, focal weakness, loss of consciousness and headaches.  Endo/Heme/Allergies: Does not bruise/bleed  easily.  Psychiatric/Behavioral: Negative for depression, memory loss and substance abuse. The patient is not nervous/anxious.    Blood pressure 111/62, pulse 88, temperature 98.2 F (36.8 C), temperature source Oral, resp. rate 17, height 5\' 5"  (1.651 m), weight 86.2 kg, SpO2 96 %. Physical Exam  Constitutional: He appears well-developed and well-nourished. No distress.  HENT:  Head: Normocephalic and atraumatic.  Eyes: Conjunctivae are normal. Right eye exhibits no discharge. Left eye exhibits no discharge. No scleral icterus.  Neck: Normal range of motion.  Cardiovascular: Normal rate and regular rhythm.  Respiratory: Effort normal. No respiratory distress.  Musculoskeletal:     Comments: Right shoulder, elbow, wrist, digits-  Punctate sinus tract AC fossa with mucopurulent discharge, erythema from [facility] foss[facility] extending to the hand, predominantly volar, mild volar firmness, mild-severe TTP from [facility] fossa to hand respectively, no obvious fluctuance, no instability, no blocks to motion  Sens  Ax/R/M/U intact  Mot   Ax/ R/ PIN/ M/ AIN/ U intact  Rad 2+  RLE No traumatic wounds, ecchymosis, or rash  Mild TTP lower leg, erythema, ?mild edema  No knee or ankle effusion  Knee stable to varus/ valgus and anterior/posterior stress  Sens DPN, SPN, TN intact  Motor EHL, ext, flex, evers 5/5  DP 2+, PT 1+, No significant edema  Neurological: He is alert.  Skin: Skin is warm and dry. He is not diaphoretic.  Psychiatric: He has Robert normal mood and affect. His behavior is normal.    Assessment/Plan: RUE cellulitis -- Given relative lack of pain at the [facility] fossa and, presumably, Robert way for the fluid collection to drain, I do not feel immediate surgical debridement is indicated. Will order formal US to try to quantify size of fluid collection and location. I have asked him to only take in small amounts of clear liquids until we have the results back. RLE cellulitis -- No sign of fluctuance. Would continue broad spectrum abx. PSA Depression/anxiety    Freeman CaldronMichael J. Kenneth Cuaresma, PA-C Orthopedic Surgery [phone] 10/20/2018, 2:10 PM

## 2018-10-20 NOTE — Care Management (Signed)
Attempt to assess. Patient provided with SA resources. Stated he did not want to talk, wanted to "be left alone." Stated that he "knew more about rehab than any counselor," and that he "is not interested." Resources left at bedside. He stated that he was kicked out of the [facility] since he relapsed, and he has no place he can identify at this time to go at [place].  Anticipate course of IV Abx to treat cellulitis as long as patient agreeable. Will continue to follow.

## 2018-10-21 DIAGNOSIS — Z720 Tobacco use: Secondary | ICD-10-CM

## 2018-10-21 DIAGNOSIS — B9561 Methicillin susceptible Staphylococcus aureus infection as the cause of diseases classified elsewhere: Secondary | ICD-10-CM

## 2018-10-21 DIAGNOSIS — F1990 Other psychoactive substance use, unspecified, uncomplicated: Secondary | ICD-10-CM

## 2018-10-21 LAB — BASIC METABOLIC PANEL
Anion gap: 10 (ref 5–15)
BUN: 8 mg/dL (ref 6–20)
CO2: 28 mmol/L (ref 22–32)
Calcium: 8.7 mg/dL — ABNORMAL LOW (ref 8.9–10.3)
Chloride: 99 mmol/L (ref 98–111)
Creatinine, Ser: 0.81 mg/dL (ref 0.61–1.24)
GFR calc Af Amer: >60 mL/min (ref >60)
GFR calc non Af Amer: >60 mL/min (ref >60)
Glucose, Bld: 105 mg/dL — ABNORMAL HIGH (ref 70–99)
Potassium: 3.8 mmol/L (ref 3.5–5.1)
Sodium: 137 mmol/L (ref 135–145)

## 2018-10-21 LAB — AEROBIC CULTURE W GRAM STAIN (SUPERFICIAL SPECIMEN)

## 2018-10-21 MED ORDER — VANCOMYCIN HCL 10 G IV SOLR
1750.0000 mg | Freq: Once | INTRAVENOUS | Status: AC
  Administered 2018-10-21: 1750 mg via INTRAVENOUS
  Filled 2018-10-21: qty 1750

## 2018-10-21 MED ORDER — HYDROMORPHONE HCL 1 MG/ML IJ SOLN
1.0000 mg | INTRAMUSCULAR | Status: DC | PRN
  Administered 2018-10-21 – 2018-10-23 (×10): 1 mg via INTRAVENOUS
  Filled 2018-10-21 (×9): qty 1

## 2018-10-21 MED ORDER — HYDROMORPHONE HCL 1 MG/ML IJ SOLN
INTRAMUSCULAR | Status: AC
  Filled 2018-10-21: qty 1

## 2018-10-21 MED ORDER — FENTANYL CITRATE (PF) 100 MCG/2ML IJ SOLN
25.0000 ug | Freq: Once | INTRAMUSCULAR | Status: AC
  Administered 2018-10-21: 25 ug via INTRAVENOUS

## 2018-10-21 MED ORDER — FENTANYL CITRATE (PF) 100 MCG/2ML IJ SOLN
INTRAMUSCULAR | Status: AC
  Filled 2018-10-21: qty 2

## 2018-10-21 MED ORDER — VANCOMYCIN HCL 10 G IV SOLR
1500.0000 mg | Freq: Two times a day (BID) | INTRAVENOUS | Status: DC
  Administered 2018-10-21 – 2018-10-23 (×4): 1500 mg via INTRAVENOUS
  Filled 2018-10-21 (×5): qty 1500

## 2018-10-21 NOTE — Progress Notes (Signed)
Pharmacy Antibiotic Note  Robert [name] is a 38 y.o. male admitted on 10/19/2018 with cellulitis.  Pharmacy has been consulted for vancomycin dosing.  Plan: Vancomycin 1750mg  IV x 1, then 1500mg  IV q12h  Expected AUC 511, Scr used 0.81 D/C cefazolin F/u staph aureus sensitivities and deescalate if able.   Height: 5\' 5"  (165.1 cm) Weight: 190 lb (86.2 kg) IBW/kg (Calculated) : 61.5  Temp (24hrs), Avg:98.6 F (37 C), Min:98.4 F (36.9 C), Max:99 F (37.2 C)  Recent Labs  Lab 10/19/18 1012 10/19/18 1223 10/20/18 0259 10/21/18 0429  WBC  --  12.2* 9.9  --   CREATININE 0.90  --  0.82 0.81  LATICACIDVEN 1.4  --   --   --     Estimated Creatinine Clearance: 126.1 mL/min (by C-G formula based on SCr of 0.81 mg/dL).    Allergies  Allergen Reactions  . Hydrocodone     nausea    Antimicrobials this admission: 8/24 cefazolin >> 8/26 8/26 vancomycin >>   Dose adjustments this admission:   Microbiology results: 8/26 BCx: NGTD 8/24: Wound culture: Staph aureus  Robert [name] A. Levada Dy, PharmD, BCPS, FNKF Clinical Pharmacist [place] Please utilize Amion for appropriate phone number to reach the unit pharmacist ([place])   10/21/2018 10:48 AM

## 2018-10-21 NOTE — Plan of Care (Signed)
  Problem: Skin Integrity: Goal: Risk for impaired skin integrity will decrease Outcome: Progressing   Problem: Pain Managment: Goal: General experience of comfort will improve Outcome: Progressing   Problem: Safety: Goal: Ability to remain free from injury will improve Outcome: Progressing   Problem: Pain Managment: Goal: General experience of comfort will improve Outcome: Progressing   Problem: Clinical Measurements: Goal: Ability to maintain clinical measurements within normal limits will improve Outcome: Progressing

## 2018-10-21 NOTE — Plan of Care (Signed)
°  Problem: Health Behavior/Discharge Planning: °Goal: Ability to manage health-related needs will improve °Outcome: Progressing °  °Problem: Clinical Measurements: °Goal: Diagnostic test results will improve °Outcome: Progressing °  °Problem: Pain Managment: °Goal: General experience of comfort will improve °Outcome: Progressing °  °

## 2018-10-21 NOTE — Progress Notes (Signed)
RN spoke with Legrand [place], [place] and pt is okay to eat full diet. Pt informed and given menu to order food.

## 2018-10-21 NOTE — Progress Notes (Signed)
   Subjective: Pt seen at the bedside this morning. Continues to have pain of the RLE and RUE, and numbness of the RUE. Denies fevers or chills.   Objective:  Vital signs in last 24 hours: Vitals:   10/20/18 0915 10/20/18 1649 10/20/18 2031 10/21/18 0456  BP: 111/62 110/69 125/65 139/68  Pulse: 88 78 83 80  Resp: 17 18    Temp: 98.2 F (36.8 C) 98.4 F (36.9 C) 98.7 F (37.1 C) 99 F (37.2 C)  TempSrc: Oral Oral Oral Oral  SpO2: 96% 95% 99% 98%  Weight:      Height:      Physical Exam Vitals signs and nursing note reviewed.  Constitutional:      General: He is not in acute distress. Cardiovascular:     Rate and Rhythm: Normal rate and regular rhythm.     Heart sounds: Normal heart sounds.  Pulmonary:     Effort: Pulmonary effort is normal.     Breath sounds: Normal breath sounds.  Skin:    Comments: RUE continues to be swollen and tense. Radial pulse intact. Endorses continued decreased sensation on the dorsal aspect of his hand. Perserved ROM of wrist, though decreased ability to move fingers secondary to swelling. Erythema on RLE spread from yesterday and remains tender to palpation. LUE erythema improved.  Neurological:     Mental Status: He is easily aroused. He is lethargic.      Assessment/Plan: Mr. Szatkowski is a 38 year old M with significant PMH of depression, anxiety, and polysubstance use, who presented with bilateral UE and R LE swelling, erythema, and warmth, a fever, and leukocytosis concerning for cellulitis.     Purulent Cellulitis - not improving RUE continues to appear tense today with worsening ability to move fingers, though still okay ROM in wrist. Erythema on RLE larger today and continues to be painful. LUE appears stable from yesterday. Pt endorses punctate wound in R AC intermittently draining. UE ultrasound on 8/25 with "poor defined areas of subcutaneous edema consistent with cellulitis. No defined abscesses." Wounds do not appear to be improving with  current antibiotic regimen - will broaden to vancomycin - order MRI of R hand and forearm - pain control with 1mg  Dilaudid q4h PRN IV - orthopedics consulted for potential need for I&D - MRSA screening negative - blood cultures no growth in <24hr - wound culture with abundant Staph aureus, sensitivities will help guide therapy - BMP and CBC in AM     Anxiety/depression - continue duloxetine 30mg  PO daily     Tobacco Use Disorder - nicotine patch     Diet - regular Fluids - none DVT ppx - enoxaparin 40mg  subQ daily CODE STATUS - FULL CODE    Dispo: Anticipated discharge in approximately 2-3 day(s).   Ladona Horns, MD 10/21/2018, 6:35 AM Pager: [phone]

## 2018-10-21 NOTE — Progress Notes (Signed)
Pt very agitated, verbally abusive, stated  "Im In Fucking pain Bitch."  C/O of pain rated 10/10 in RUE. Call out to covering MD. Orders given twice during shift for fentanyl 25 mcg IV(one time dose each). Pt verbalized understanding that he is NPO except sips with meds per MD order.   Pt did apologize for the outburst of profanity, stated " I get like that sometimes and thanks for taking good care of me." Day shift nurse updated and made aware of possible surgery maybe later today.

## 2018-10-22 ENCOUNTER — Inpatient Hospital Stay (HOSPITAL_COMMUNITY): Payer: Self-pay

## 2018-10-22 MED ORDER — ONDANSETRON HCL 4 MG/2ML IJ SOLN
4.0000 mg | Freq: Four times a day (QID) | INTRAMUSCULAR | Status: DC | PRN
  Administered 2018-10-22: 4 mg via INTRAVENOUS
  Filled 2018-10-22: qty 2

## 2018-10-22 NOTE — Plan of Care (Signed)
  Problem: Skin Integrity: Goal: Risk for impaired skin integrity will decrease Outcome: Progressing   Problem: Pain Managment: Goal: General experience of comfort will improve Outcome: Progressing   

## 2018-10-22 NOTE — Progress Notes (Signed)
Subjective: Pt seen at the bedside this morning. States he is feeling improved from yesterday, with less pain in RUE and LLE. Denies fevers. Endorses some headaches and nausea overnight.  Objective:  Vital signs in last 24 hours: Vitals:   10/21/18 0844 10/21/18 1623 10/21/18 2045 10/22/18 0530  BP: 112/64 113/67 122/65 107/63  Pulse: 80 76 80 73  Resp: 16 16 20 16   Temp: 98.4 F (36.9 C) 98.3 F (36.8 C) 98.4 F (36.9 C) 98.2 F (36.8 C)  TempSrc: Oral Oral Oral Oral  SpO2: 96%  98% 97%  Weight:      Height:      Physical Exam Constitutional:      General: He is not in acute distress. Cardiovascular:     Rate and Rhythm: Normal rate and regular rhythm.     Heart sounds: Normal heart sounds. No murmur. No friction rub. No gallop.   Pulmonary:     Effort: Pulmonary effort is normal. No respiratory distress.     Breath sounds: Normal breath sounds.  Abdominal:     General: Abdomen is flat. Bowel sounds are normal. There is no distension.  Skin:    Comments: RUE less tight with swelling and erythema improved. Still has an area of distinct erythema on the forearm that is less tender to palpation then yesterday. Improved ROM in R wrist and fingers. Punctate wound in R antecubital fossa healing, no active drainage. RLE with erythema mildly improved, remains warm. LUE erythema and warm resolved.  Neurological:     Mental Status: He is alert.  Psychiatric:        Mood and Affect: Mood normal.    Assessment/Plan: [name] is a 38 year old M with significant PMH of depression, anxiety, and polysubstance use, who presented with bilateral UE and R LE swelling, erythema, and warmth, a fever, and leukocytosis concerning for cellulitis.     Purulent Cellulitis - improved today RUE much better today with less pain and increased mobility in wrist and fingers. Erythema on RLE mildly improved today. LUE resolved. R AC wound healing without drainage. UE ultrasound on 8/25 with "poor  defined areas of subcutaneous edema consistent with cellulitis. No defined abscesses."  - continue vancomycin regimen  - f/u MRI of R hand and forearm - pain control with 1mg  Dilaudid q4h PRN IV - orthopedics consulted for potential need for I&D - MRSA screening negative - wound culture grew MSSA, though question validity of culture as it was taken around site and not of any purulent drainage so likely representative of normal skin flora. Pt also clinically improved after initiation of vancomycin therapy. - blood cultures no growth in 2 days      Substance Use Disorder  Pt endorses periods of sobriety and substance use over the past 5 years, states that "working the program and good people" around him have help him stay sober before. Notes that he begins using again when he is around certain people and the opportunity is there. States that no matter the support around him, it is ultimately his responsibly to make the decision to remain sober.  - pt refusing additional resources at this time, but appreciative of the care he has received while hospitalized   Tobacco Use Disorder - nicotine patch    Anxiety/depression - continue duloxetine 30mg  PO daily   Diet - regular Fluids - none DVT ppx - enoxaparin 40mg  subQ daily CODE STATUS - FULL CODE    Dispo: Anticipated discharge in approximately  1-2 day(s).   Ladona Horns, MD 10/22/2018, 6:30 AM Pager: [phone]

## 2018-10-23 LAB — BASIC METABOLIC PANEL
Anion gap: 10 (ref 5–15)
BUN: 7 mg/dL (ref 6–20)
CO2: 25 mmol/L (ref 22–32)
Calcium: 8.8 mg/dL — ABNORMAL LOW (ref 8.9–10.3)
Chloride: 103 mmol/L (ref 98–111)
Creatinine, Ser: 0.76 mg/dL (ref 0.61–1.24)
GFR calc Af Amer: >60 mL/min (ref >60)
GFR calc non Af Amer: >60 mL/min (ref >60)
Glucose, Bld: 128 mg/dL — ABNORMAL HIGH (ref 70–99)
Potassium: 3.9 mmol/L (ref 3.5–5.1)
Sodium: 138 mmol/L (ref 135–145)

## 2018-10-23 LAB — C-REACTIVE PROTEIN: CRP: 2.5 mg/dL — ABNORMAL HIGH (ref <1.0)

## 2018-10-23 MED ORDER — DOXYCYCLINE HYCLATE 100 MG PO TBEC: 100.0000 mg | DELAYED_RELEASE_TABLET | Freq: Two times a day (BID) | ORAL | 0 refills | Status: AC

## 2018-10-23 MED ORDER — OXYCODONE-ACETAMINOPHEN 10-325 MG PO TABS: 1.0000 | ORAL_TABLET | Freq: Four times a day (QID) | ORAL | 0 refills | Status: AC | PRN

## 2018-10-23 MED ORDER — HYDROMORPHONE HCL 1 MG/ML IJ SOLN
1.0000 mg | Freq: Four times a day (QID) | INTRAMUSCULAR | Status: DC | PRN
  Administered 2018-10-23: 10:00:00 1 mg via INTRAVENOUS
  Filled 2018-10-23: qty 1

## 2018-10-23 MED ORDER — HYDROMORPHONE HCL 1 MG/ML IJ SOLN
1.0000 mg | INTRAMUSCULAR | Status: DC | PRN
  Administered 2018-10-23: 1 mg via INTRAVENOUS
  Filled 2018-10-23: qty 1

## 2018-10-23 MED ORDER — DULOXETINE HCL 30 MG PO CPEP: 30.0000 mg | ORAL_CAPSULE | Freq: Every day | ORAL | 0 refills | Status: DC

## 2018-10-23 MED FILL — OXYCODONE-APAP 10-325: 10-325 | 3 days supply | Qty: 12 | Fill #0

## 2018-10-23 MED FILL — DOXYCYCLINE HYCLATE 100 MG: 100 | 7 days supply | Qty: 14 | Fill #0

## 2018-10-23 MED FILL — DULoxetine HCL 30 MG CPEP: 30 | 30 days supply | Qty: 30 | Fill #0

## 2018-10-23 NOTE — Plan of Care (Signed)
  Problem: Clinical Measurements: Goal: Diagnostic test results will improve Outcome: Progressing   Problem: Nutrition: Goal: Adequate nutrition will be maintained Outcome: Progressing   Problem: Elimination: Goal: Will not experience complications related to bowel motility Outcome: Progressing   Problem: Safety: Goal: Ability to remain free from injury will improve Outcome: Progressing   

## 2018-10-23 NOTE — Progress Notes (Signed)
Pt stated that he wanted to go home. MD was made aware. [place] negative for abscess, se new orders. Continue to monitor.

## 2018-10-23 NOTE — Progress Notes (Signed)
Subjective: Pt seen at the bedside this morning. Had trouble sleeping last evening due to R lateral knee pain and headaches. Upper extremities improving. Looking forward to leaving the hospital. Denies depression or SI.  Objective:  Vital signs in last 24 hours: Vitals:   10/22/18 0758 10/22/18 1504 10/22/18 2034 10/23/18 0254  BP: 107/66 115/63 100/66 114/65  Pulse: 73 66 77 74  Resp: 16 17 20 16   Temp: 98.5 F (36.9 C) 97.8 F (36.6 C) 98 F (36.7 C) 98.4 F (36.9 C)  TempSrc: Oral Oral Oral Oral  SpO2: 99%  99% 100%  Weight:      Height:      Physical Exam Vitals signs and nursing note reviewed.  Constitutional:      General: He is not in acute distress.    Appearance: Normal appearance.  Cardiovascular:     Rate and Rhythm: Normal rate and regular rhythm.  Pulmonary:     Effort: Pulmonary effort is normal. No respiratory distress.     Breath sounds: Normal breath sounds.  Abdominal:     General: Abdomen is flat.  Musculoskeletal:     Right lower leg: No edema.     Left lower leg: No edema.  Skin:    Comments: New small 2-3cm area of erythema on the the R lateral knee that is erythematous, warm, and tender to palpation.  Shin of RLE with decreased erythema today. L and R UE improved from yesterday with decreased tenderness and erythema.       Assessment/Plan: Robert [name] is a 38 year old M with significant PMH of depression, anxiety, and polysubstance use, who presented with bilateral UE and R LE swelling, erythema, and warmth, a fever, and leukocytosis consistent with cellulitis.     Purulent Cellulitis - improving  RUE continues to improve with less pain, increased mobility and decreased swelling in wrist and fingers. Erythema on RLE also improved from yesterday, though with new area of erythema on R lateral knee. LUE resolved. R AC wound healing without drainage. UE ultrasound on 8/25 with "poor defined areas of subcutaneous edema consistent with cellulitis. No  defined abscesses." MRI on 8/27 with "Diffuse subcutaneous edema about the forearm and hand consistent with cellulitis. Negative for abscess, osteomyelitis or septic joint."  - will ultrasound wound on R lateral knee to ensure no abscess or underlying fluid collection - continue vancomycin regimen  - will switch to PO doxycycline at discharge - pain control with 1mg  Dilaudid q6h PRN IV - MRSA screening negative - wound culture grew MSSA, though question validity of culture as it was not of any purulent drainage and pt continues to clinically improved after initiation of vancomycin therapy - blood cultures no growth to date     Substance Use Disorder  Pt endorses periods of sobriety and substance use over the past 5 years. Reiterates today that he knows what he needs to do and it is ultimately his responsibly to make the decision to remain sober. - denies SI or depression at this time - pt refusing additional resources at this time, but appreciative of the care he has received while hospitalized  - list of resources left in pt's room by case management  Tobacco Use Disorder - nicotine patch    Anxiety/depression - continue duloxetine 30mg  PO daily   Diet - regular Fluids - none DVT ppx - enoxaparin 40mg  subQ daily CODE STATUS - FULL CODE    Dispo: Anticipated discharge today or tomorrow pending ultrasound results.  Will schedule close outpatient follow-up next week to check in on the wounds and pt's mood.  Robert [name], Robert Marbach, MD 10/23/2018, 6:25 AM Pager: [phone]

## 2018-10-23 NOTE — TOC Progression Note (Addendum)
Transition of Care [facility]) - Progression Note    Patient Details  Name: Robert [name] MRN: [id number] Date of Birth: [birth year]  [facility]) CM/SW Contact  Jacalyn Lefevre Edson Snowball, RN Phone Number: 10/23/2018, 11:20 AM  Clinical Narrative:     Patient for possible discharge today or tomorrow. Spoke with Dr Aram Candela she will enter prescriptions today and send to [facility]. Will provide assistance with MATCH.   Prescriptions sent to [facility] , patient entered in [facility]. If patient does not discharge today , medications will be placed in main pharmacy.   Patient lives in [place], previous case manager provided information on [facility] , patient will call to arrange appointment to establish care.  Expected Discharge Plan: Home/Self Care Barriers to Discharge: Continued Medical Work up  Expected Discharge Plan and Services Expected Discharge Plan: Home/Self Care   Discharge Planning Services: [facility], [facility], Medication Assistance   Living arrangements for the past 2 months: Single Family Home                           HH Arranged: NA           Social Determinants of Health (SDOH) Interventions    Readmission Risk Interventions No flowsheet data found.

## 2018-10-23 NOTE — Discharge Summary (Signed)
Name: Robert [name] MRN: [id number] DOB: 11/17/1980 37 y.o. PCP: Patient, No Pcp Per  Date of Admission: 10/19/2018  5:42 AM Date of Discharge: 10/23/2018 Attending Physician: Dr. Jessy OtoAlexander Raines  Discharge Diagnosis: 1. Non-purulent cellulitis  Discharge Medications: Allergies as of 10/23/2018      Reactions   Hydrocodone    nausea      Medication List    TAKE these medications   doxycycline 100 MG EC tablet Commonly known as: DORYX Take 1 tablet (100 mg total) by mouth 2 (two) times daily for 7 days. Notes to patient: Every 12 hours.   DULoxetine 30 MG capsule Commonly known as: CYMBALTA Take 1 capsule (30 mg total) by mouth daily.   oxyCODONE-acetaminophen 10-325 MG tablet Commonly known as: Percocet Take 1 tablet by mouth every 6 (six) hours as needed for up to 3 days for pain.       Disposition and follow-up:   Robert [name] [facility] in Good condition.  At the hospital follow up visit please address:  1.  Pt was seen for cellulitis of R and L UE and RLE. Improved with vancomycin in the hospital and discharged with 7 day course of PO doxycycline.  [place]. Needs repeat Hep C titer in September. If positive, will need referral to hepatologist for Hep C treatment.  2.  Labs / imaging needed at time of follow-up: Hep C titer  3.  Pending labs/ test needing follow-up: NONE  Follow-up Appointments: Follow-up Information    Inc, [facility] Follow up.   Specialty: Pediatrics Why: Call to establish a primary care provider.  Contact information: [address] [place] [address] [phone]           Hospital Course by problem list: Robert [name] is a 38 year old M with significant PMH of depression, anxiety, and polysubstance use, who presented with bilateral UE and R LE swelling, erythema, and warmth after using IV drugs. Pt stated he  was injecting heroin, methamphetamines, and cocaine. He noticed worsening pain and redness in spots on his extremities at his injection sites. He was previously admitted in January for a similar episode. In the ED, he was febrile with a leukocytosis of 12.2 with absolute neutrophils 9.2 and basophils 0.2. Other lab work and vitals were unremarkable. His presentation was concerning for non-purulent cellulitis. He was started on 1g IV cefazolin. The following day on 8/26, he had minimal improvement of his wounds and his R forearm was quite swollen and firm. RUE had good cap refill and radial pulse, with normal CK of 117. Ultrasound imaging was done and did not show any underlying abscess cavity. On 8/27, the RUE and RLE were still not improved and antibiotic coverage was broadened to vancomycin. A wound culture sampled from an intermittently draining sinus tract in the R antecubital fossa grew MSSA, though the culture was taken of the skin site and not of draining fluid. Pt only began to clinically improve when broadened to vancomycin so MRSA coverage was continued. MRI also completed on 8/27 showed diffuse subcutaneous edema but no abscess, osteomyelitis, or septic joint. On 8/28, pt continued to improve. He had one new area of erythema on the R lateral knee, that point-of-care ultrasound showed to be edema without fluid collection or joint effusion. ROM intact and able to bear weight on the joint so little concern for septic arthritis. Pt's blood cultures  were negative after 5 days. At discharge, pt was doing well and discharged with PO doxycycline and instructed to schedule close follow-up for wound check and drug abstinence.   Discharge Vitals:   BP 106/64 (BP Location: Left Arm)   Pulse 66   Temp 98.2 F (36.8 C) (Oral)   Resp 16   Ht 5\' 5"  (1.651 m)   Wt 86.2 kg   SpO2 97%   BMI 31.62 kg/m   Pertinent Labs, Studies, and Procedures:  CBC Latest Ref Rng & Units 10/20/2018 10/19/2018 03/02/2018  WBC 4.0  - 10.5 K/uL 9.9 12.2(H) 8.1  Hemoglobin 13.0 - 17.0 g/dL 14.3 14.4 11.3(L)  Hematocrit 39.0 - 52.0 % 40.6 41.3 36.0(L)  Platelets 150 - 400 K/uL 280 260 328   BMP Latest Ref Rng & Units 10/23/2018 10/21/2018 10/20/2018  Glucose 70 - 99 mg/dL 128(H) 105(H) 123(H)  BUN 6 - 20 mg/dL 7 8 5(L)  Creatinine 0.61 - 1.24 mg/dL 0.76 0.81 0.82  Sodium 135 - 145 mmol/L 138 137 133(L)  Potassium 3.5 - 5.1 mmol/L 3.9 3.8 2.9(L)  Chloride 98 - 111 mmol/L 103 99 95(L)  CO2 22 - 32 mmol/L 25 28 27   Calcium 8.9 - 10.3 mg/dL 8.8(L) 8.7(L) 8.7(L)   Results for orders placed or performed during the hospital encounter of 10/19/18  SARS CORONAVIRUS 2 Nasal Swab Aptima Multi Swab     Status: None   Collection Time: 10/19/18  9:59 AM   Specimen: Aptima Multi Swab; Nasal Swab  Result Value Ref Range Status   SARS Coronavirus 2 NEGATIVE NEGATIVE Final    Comment: (NOTE) SARS-CoV-2 target nucleic acids are NOT DETECTED. The SARS-CoV-2 RNA is generally detectable in upper and lower respiratory specimens during the acute phase of infection. Negative results do not preclude SARS-CoV-2 infection, do not rule out co-infections with other pathogens, and should not be used as the sole basis for treatment or other patient management decisions. Negative results must be combined with clinical observations, patient history, and epidemiological information. The expected result is Negative. Fact Sheet for Patients: SugarRoll.be Fact Sheet for Healthcare Providers: https://www.woods-mathews.com/ This test is not yet approved or cleared by the [place] FDA and  has been authorized for detection and/or diagnosis of SARS-CoV-2 by FDA under an Emergency Use Authorization (EUA). This EUA will remain  in effect (meaning this test can be used) for the duration of the COVID-19 declaration under Section 56 4(b)(1) of the Act, 21 U.S.C. section 360bbb-3(b)(1), unless the authorization  is terminated or revoked sooner. Performed at [facility], [place] [address] [place], [place] [address]   Culture, blood (routine x 2)     Status: None   Collection Time: 10/19/18 10:00 AM   Specimen: BLOOD LEFT FOREARM  Result Value Ref Range Status   Specimen Description BLOOD LEFT FOREARM  Final   Special Requests   Final    AEROBIC BOTTLE ONLY Blood Culture results may not be optimal due to an inadequate volume of blood received in culture bottles   Culture   Final    NO GROWTH 5 DAYS Performed at [facility], [place] [address] [place], [place] [address]    Report Status 10/24/2018 FINAL  Final  Wound or Superficial Culture     Status: None   Collection Time: 10/19/18 10:01 AM   Specimen: Wound  Result Value Ref Range Status   Specimen Description WOUND RIGHT [facility]  Final   Special Requests NONE  Final  Gram Stain   Final    RARE WBC PRESENT, PREDOMINANTLY PMN FEW GRAM POSITIVE COCCI IN PAIRS    Culture   Final    ABUNDANT STAPHYLOCOCCUS AUREUS MODERATE GROUP B STREP(S.AGALACTIAE)ISOLATED ORGANISM 2 TESTING AGAINST S. AGALACTIAE NOT ROUTINELY PERFORMED DUE TO PREDICTABILITY OF AMP/PEN/VAN SUSCEPTIBILITY. Performed at [facility], [address] [place], [place] [address]    Report Status 10/21/2018 FINAL  Final   Organism ID, Bacteria STAPHYLOCOCCUS AUREUS  Final      Susceptibility   Staphylococcus aureus - MIC*    CIPROFLOXACIN <=0.5 SENSITIVE Sensitive     ERYTHROMYCIN <=0.25 SENSITIVE Sensitive     GENTAMICIN <=0.5 SENSITIVE Sensitive     OXACILLIN <=0.25 SENSITIVE Sensitive     TETRACYCLINE <=1 SENSITIVE Sensitive     VANCOMYCIN 1 SENSITIVE Sensitive     TRIMETH/SULFA <=10 SENSITIVE Sensitive     CLINDAMYCIN <=0.25 SENSITIVE Sensitive     RIFAMPIN <=0.5 SENSITIVE Sensitive     Inducible Clindamycin NEGATIVE Sensitive     * ABUNDANT STAPHYLOCOCCUS AUREUS  Culture, blood (routine x 2)     Status: None   Collection Time: 10/19/18 10:10 AM    Specimen: BLOOD  Result Value Ref Range Status   Specimen Description BLOOD LEFT ANTECUBITAL  Final   Special Requests   Final    AEROBIC BOTTLE ONLY Blood Culture results may not be optimal due to an inadequate volume of blood received in culture bottles   Culture   Final    NO GROWTH 5 DAYS Performed at [facility], [address] [place], [place] [address]    Report Status 10/24/2018 FINAL  Final  MRSA PCR Screening     Status: None   Collection Time: 10/19/18  1:56 PM   Specimen: Nasopharyngeal  Result Value Ref Range Status   MRSA by PCR NEGATIVE NEGATIVE Final    Comment:        The GeneXpert MRSA Assay (FDA approved for NASAL specimens only), is one component of a comprehensive MRSA colonization surveillance program. It is not intended to diagnose MRSA infection nor to guide or monitor treatment for MRSA infections. Performed at [facility], [address] [place], [place] [address]    R Upper Extremity Ultrasound 10/20/2018 CLINICAL DATA:  Cellulitis of the right arm.  IV drug user.  EXAM: ULTRASOUND RIGHT UPPER EXTREMITY LIMITED  TECHNIQUE: Ultrasound examination of the upper extremity soft tissues was performed in the area of clinical concern.  COMPARISON:  None.  FINDINGS: There are several areas of edema in the subcutaneous fat in the antecubital fossa and mid and distal forearm. No definable discrete abscesses.  IMPRESSION: Multiple poorly defined areas of subcutaneous edema consistent with cellulitis. No defined abscesses.  MR R Hand and Forearm 10/22/2018 CLINICAL DATA:  IV drug abuser with right forearm and hand pain and swelling.  EXAM: MRI OF THE RIGHT HAND WITHOUT CONTRAST; MRI OF THE RIGHT FOREARM WITHOUT CONTRAST  TECHNIQUE: Multiplanar, multisequence MR imaging of the right hand and forearm was performed. No intravenous contrast was administered.  COMPARISON:  None.  FINDINGS: The examination is somewhat  limited as the patient was unable to tolerate additional scanning. Lateral aspect of the proximal forearm is obscured by artifact.  Bones/Joint/Cartilage  No marrow signal abnormality suggest osteomyelitis is identified. No acute bony or joint abnormality. Ulnar minus variance noted.  Ligaments  Negative.  Muscles and Tendons  No intramuscular fluid collection. No tear or strain. No mass is  identified.  Soft tissues  Diffuse subcutaneous edema about the forearm and hand is present.  IMPRESSION: Diffuse subcutaneous edema about the forearm and hand consistent with cellulitis. Negative for abscess, osteomyelitis or septic joint.   Discharge Instructions: Discharge Instructions    Call MD for:  extreme fatigue   Complete by: As directed    Call MD for:  persistant dizziness or light-headedness   Complete by: As directed    Call MD for:  persistant nausea and vomiting   Complete by: As directed    Call MD for:  redness, tenderness, or signs of infection (pain, swelling, redness, odor or green/yellow discharge around incision site)   Complete by: As directed    Call MD for:  severe uncontrolled pain   Complete by: As directed    Call MD for:  temperature >100.4   Complete by: As directed    Diet - low sodium heart healthy   Complete by: As directed    Discharge instructions   Complete by: As directed    You were seen for skin infections on your arms and leg. Imaging of these spots did not show any underlying abscesses or spread of the infection to the muscle or bone. Continue taking the antibiotic (doxycycline) twice a day for 7 days. It is important to finish the entire course even if you begin feeling between. You can take the pain medicine once every 6 hours while the infection resolves.   Follow-up with [facility] next week to ensure that these wounds are healing. At this appointment, please ask about your Hepatitis C lab work to see if that  needs further treatment. It is also important for your overall health to establish care with a primary care doctor.   If you need help or support in abstaining from drug use, please review the packet of resources left in your room which have the numbers for clinics, rehabs, and residential homes.   Call a medical provider if you begin having fevers, chills, worsening pain or erythema around the infection sites, or any other new or concerning symptoms.   Increase activity slowly   Complete by: As directed       Signed: Thom Chimes, MD 10/24/2018, 4:19 PM   Pager: [phone]

## 2018-10-24 LAB — CULTURE, BLOOD (ROUTINE X 2)
Culture: NO GROWTH
Culture: NO GROWTH

## 2018-10-26 LAB — HIV ANTIBODY (ROUTINE TESTING W REFLEX): HIV Screen 4th Generation wRfx: NONREACTIVE

## 2018-10-26 LAB — HCV RT-PCR, QUANT (NON-GRAPH)
HCV log10: 3.356 log10 IU/mL
Hepatitis C Quantitation: 2270 IU/mL

## 2018-10-26 LAB — HCV AB W REFLEX TO QUANT PCR: HCV Ab: >11 s/co ratio — ABNORMAL HIGH (ref 0.0–0.9)

## 2019-07-24 ENCOUNTER — Emergency Department (HOSPITAL_COMMUNITY)
Admission: EM | Admit: 2019-07-24 | Discharge: 2019-07-24 | Disposition: A | Discharge status: Home / Self Care | Payer: Self-pay | Attending: Emergency Medicine | Admitting: Emergency Medicine

## 2019-07-24 ENCOUNTER — Other Ambulatory Visit: Payer: Self-pay

## 2019-07-24 DIAGNOSIS — L02511 Cutaneous abscess of right hand: Secondary | ICD-10-CM | POA: Insufficient documentation

## 2019-07-24 DIAGNOSIS — Z5321 Procedure and treatment not carried out due to patient leaving prior to being seen by health care provider: Secondary | ICD-10-CM | POA: Insufficient documentation

## 2019-07-24 DIAGNOSIS — R21 Rash and other nonspecific skin eruption: Secondary | ICD-10-CM | POA: Insufficient documentation

## 2019-07-24 NOTE — ED Triage Notes (Addendum)
Patient presents to triage pacing around room. Patient unable to finish thoughts/sentences. Patient says he has rash on his wrist. Patient has abscess on left wrist and on top of right hand. Patient reports 8/10. Patient reports nausea as well.

## 2019-07-24 NOTE — ED Notes (Signed)
Patient states he is tired of waiting. Patient grabbed personal  bags and left ED

## 2019-08-02 ENCOUNTER — Ambulatory Visit: Payer: Self-pay | Attending: Internal Medicine

## 2019-08-02 DIAGNOSIS — Z23 Encounter for immunization: Secondary | ICD-10-CM

## 2019-08-06 ENCOUNTER — Other Ambulatory Visit: Payer: Self-pay

## 2019-08-06 DIAGNOSIS — B182 Chronic viral hepatitis C: Secondary | ICD-10-CM | POA: Diagnosis present

## 2019-08-06 DIAGNOSIS — F1190 Opioid use, unspecified, uncomplicated: Secondary | ICD-10-CM | POA: Diagnosis present

## 2019-08-06 DIAGNOSIS — L03113 Cellulitis of right upper limb: Principal | ICD-10-CM | POA: Diagnosis present

## 2019-08-06 DIAGNOSIS — Z885 Allergy status to narcotic agent status: Secondary | ICD-10-CM

## 2019-08-06 DIAGNOSIS — Z20822 Contact with and (suspected) exposure to covid-19: Secondary | ICD-10-CM | POA: Diagnosis present

## 2019-08-06 DIAGNOSIS — F1210 Cannabis abuse, uncomplicated: Secondary | ICD-10-CM | POA: Diagnosis present

## 2019-08-06 DIAGNOSIS — F1510 Other stimulant abuse, uncomplicated: Secondary | ICD-10-CM | POA: Diagnosis present

## 2019-08-06 DIAGNOSIS — F17210 Nicotine dependence, cigarettes, uncomplicated: Secondary | ICD-10-CM | POA: Diagnosis present

## 2019-08-06 DIAGNOSIS — R7302 Impaired glucose tolerance (oral): Secondary | ICD-10-CM | POA: Diagnosis present

## 2019-08-06 DIAGNOSIS — Z590 Homelessness: Secondary | ICD-10-CM

## 2019-08-06 DIAGNOSIS — Z833 Family history of diabetes mellitus: Secondary | ICD-10-CM

## 2019-08-06 DIAGNOSIS — F1410 Cocaine abuse, uncomplicated: Secondary | ICD-10-CM | POA: Diagnosis present

## 2019-08-06 NOTE — ED Triage Notes (Addendum)
Pt presents with a red, raised area on his R wrist. Able to move hand, wrist, and fingers. Several puncture marks noted. Endorses meth use today.

## 2019-08-07 ENCOUNTER — Emergency Department (HOSPITAL_COMMUNITY): Payer: Self-pay

## 2019-08-07 ENCOUNTER — Encounter (HOSPITAL_COMMUNITY): Payer: Self-pay

## 2019-08-07 ENCOUNTER — Other Ambulatory Visit: Payer: Self-pay

## 2019-08-07 ENCOUNTER — Inpatient Hospital Stay (HOSPITAL_COMMUNITY)
Admit: 2019-08-07 | Discharge: 2019-08-10 | DRG: 603 | Payer: Self-pay | Attending: Family Medicine | Admitting: Family Medicine

## 2019-08-07 ENCOUNTER — Observation Stay (HOSPITAL_COMMUNITY): Payer: Self-pay

## 2019-08-07 DIAGNOSIS — L03113 Cellulitis of right upper limb: Secondary | ICD-10-CM | POA: Diagnosis present

## 2019-08-07 DIAGNOSIS — B182 Chronic viral hepatitis C: Secondary | ICD-10-CM

## 2019-08-07 DIAGNOSIS — F17210 Nicotine dependence, cigarettes, uncomplicated: Secondary | ICD-10-CM | POA: Diagnosis present

## 2019-08-07 DIAGNOSIS — F1910 Other psychoactive substance abuse, uncomplicated: Secondary | ICD-10-CM

## 2019-08-07 HISTORY — DX: Cellulitis of right upper limb: L03.113

## 2019-08-07 HISTORY — DX: Nicotine dependence, cigarettes, uncomplicated: F17.210

## 2019-08-07 HISTORY — DX: Chronic viral hepatitis C: B18.2

## 2019-08-07 LAB — SEDIMENTATION RATE: Sed Rate: 15 mm/hr (ref 0–16)

## 2019-08-07 LAB — CBC WITH DIFFERENTIAL/PLATELET
Abs Immature Granulocytes: 0.02 10*3/uL (ref 0.00–0.07)
Basophils Absolute: 0.1 10*3/uL (ref 0.0–0.1)
Basophils Relative: 1 %
Eosinophils Absolute: 0.3 10*3/uL (ref 0.0–0.5)
Eosinophils Relative: 4 %
HCT: 42.4 % (ref 39.0–52.0)
Hemoglobin: 14.2 g/dL (ref 13.0–17.0)
Immature Granulocytes: 0 %
Lymphocytes Relative: 25 %
Lymphs Abs: 1.9 10*3/uL (ref 0.7–4.0)
MCH: 31 pg (ref 26.0–34.0)
MCHC: 33.5 g/dL (ref 30.0–36.0)
MCV: 92.6 fL (ref 80.0–100.0)
Monocytes Absolute: 0.9 10*3/uL (ref 0.1–1.0)
Monocytes Relative: 11 %
Neutro Abs: 4.5 10*3/uL (ref 1.7–7.7)
Neutrophils Relative %: 59 %
Platelets: 252 10*3/uL (ref 150–400)
RBC: 4.58 MIL/uL (ref 4.22–5.81)
RDW: 13.3 % (ref 11.5–15.5)
WBC: 7.7 10*3/uL (ref 4.0–10.5)
nRBC: 0 % (ref 0.0–0.2)

## 2019-08-07 LAB — LACTIC ACID, PLASMA: Lactic Acid, Venous: 1.5 mmol/L (ref 0.5–1.9)

## 2019-08-07 LAB — COMPREHENSIVE METABOLIC PANEL
ALT: 358 U/L — ABNORMAL HIGH (ref 0–44)
AST: 197 U/L — ABNORMAL HIGH (ref 15–41)
Albumin: 4 g/dL (ref 3.5–5.0)
Alkaline Phosphatase: 109 U/L (ref 38–126)
Anion gap: 10 (ref 5–15)
BUN: 10 mg/dL (ref 6–20)
CO2: 29 mmol/L (ref 22–32)
Calcium: 9.1 mg/dL (ref 8.9–10.3)
Chloride: 96 mmol/L — ABNORMAL LOW (ref 98–111)
Creatinine, Ser: 0.75 mg/dL (ref 0.61–1.24)
GFR calc Af Amer: >60 mL/min (ref >60)
GFR calc non Af Amer: >60 mL/min (ref >60)
Glucose, Bld: 100 mg/dL — ABNORMAL HIGH (ref 70–99)
Potassium: 3.9 mmol/L (ref 3.5–5.1)
Sodium: 135 mmol/L (ref 135–145)
Total Bilirubin: 0.8 mg/dL (ref 0.3–1.2)
Total Protein: 8 g/dL (ref 6.5–8.1)

## 2019-08-07 LAB — SARS CORONAVIRUS 2 BY RT PCR (HOSPITAL ORDER, PERFORMED IN ~~LOC~~ HOSPITAL LAB): SARS Coronavirus 2: NEGATIVE

## 2019-08-07 LAB — RAPID URINE DRUG SCREEN, HOSP PERFORMED
Amphetamines: POSITIVE — AB
Barbiturates: NOT DETECTED
Benzodiazepines: NOT DETECTED
Cocaine: POSITIVE — AB
Opiates: NOT DETECTED
Tetrahydrocannabinol: POSITIVE — AB

## 2019-08-07 LAB — C-REACTIVE PROTEIN: CRP: 3.2 mg/dL — ABNORMAL HIGH (ref <1.0)

## 2019-08-07 LAB — ETHANOL: Alcohol, Ethyl (B): <10 mg/dL (ref <10)

## 2019-08-07 MED ORDER — ONDANSETRON HCL 4 MG/2ML IJ SOLN: 4.0000 mg | Freq: Four times a day (QID) | INTRAMUSCULAR | Status: DC | PRN

## 2019-08-07 MED ORDER — PANTOPRAZOLE SODIUM 40 MG PO TBEC
40.0000 mg | DELAYED_RELEASE_TABLET | Freq: Every day | ORAL | Status: DC
  Administered 2019-08-07 – 2019-08-10 (×4): 40 mg via ORAL
  Filled 2019-08-07 (×4): qty 1

## 2019-08-07 MED ORDER — CLONIDINE HCL 0.1 MG PO TABS: 0.1000 mg | ORAL_TABLET | Freq: Every day | ORAL | Status: DC

## 2019-08-07 MED ORDER — CLONIDINE HCL 0.1 MG PO TABS: 0.1000 mg | ORAL_TABLET | Freq: Four times a day (QID) | ORAL | Status: DC

## 2019-08-07 MED ORDER — VANCOMYCIN HCL 1500 MG/300ML IV SOLN
1500.0000 mg | Freq: Two times a day (BID) | INTRAVENOUS | Status: DC
  Administered 2019-08-07 – 2019-08-09 (×4): 1500 mg via INTRAVENOUS
  Filled 2019-08-07 (×5): qty 300

## 2019-08-07 MED ORDER — KETOROLAC TROMETHAMINE 30 MG/ML IJ SOLN
15.0000 mg | Freq: Four times a day (QID) | INTRAMUSCULAR | Status: DC | PRN
  Administered 2019-08-07 – 2019-08-08 (×3): 15 mg via INTRAVENOUS
  Filled 2019-08-07 (×3): qty 1

## 2019-08-07 MED ORDER — KETOROLAC TROMETHAMINE 30 MG/ML IJ SOLN
30.0000 mg | Freq: Four times a day (QID) | INTRAMUSCULAR | Status: DC | PRN
  Administered 2019-08-07 – 2019-08-08 (×2): 30 mg via INTRAVENOUS
  Filled 2019-08-07 (×2): qty 1

## 2019-08-07 MED ORDER — HYDROXYZINE HCL 25 MG PO TABS
25.0000 mg | ORAL_TABLET | Freq: Four times a day (QID) | ORAL | Status: DC | PRN
  Administered 2019-08-07 – 2019-08-10 (×5): 25 mg via ORAL
  Filled 2019-08-07 (×5): qty 1

## 2019-08-07 MED ORDER — ENOXAPARIN SODIUM 40 MG/0.4ML ~~LOC~~ SOLN
40.0000 mg | SUBCUTANEOUS | Status: DC
  Administered 2019-08-07 – 2019-08-09 (×3): 40 mg via SUBCUTANEOUS
  Filled 2019-08-07 (×3): qty 0.4

## 2019-08-07 MED ORDER — GADOBUTROL 1 MMOL/ML IV SOLN
10.0000 mL | Freq: Once | INTRAVENOUS | Status: AC | PRN
  Administered 2019-08-07: 10 mL via INTRAVENOUS

## 2019-08-07 MED ORDER — DICYCLOMINE HCL 20 MG PO TABS
20.0000 mg | ORAL_TABLET | Freq: Four times a day (QID) | ORAL | Status: DC | PRN
  Administered 2019-08-09: 20 mg via ORAL
  Filled 2019-08-07: qty 1

## 2019-08-07 MED ORDER — ONDANSETRON 4 MG PO TBDP: 4.0000 mg | ORAL_TABLET | Freq: Four times a day (QID) | ORAL | Status: DC | PRN

## 2019-08-07 MED ORDER — POLYETHYLENE GLYCOL 3350 17 G PO PACK: 17.0000 g | PACK | Freq: Every day | ORAL | Status: DC | PRN

## 2019-08-07 MED ORDER — LOPERAMIDE HCL 2 MG PO CAPS: 2.0000 mg | ORAL_CAPSULE | ORAL | Status: DC | PRN

## 2019-08-07 MED ORDER — ONDANSETRON HCL 4 MG PO TABS
4.0000 mg | ORAL_TABLET | Freq: Four times a day (QID) | ORAL | Status: DC | PRN
  Administered 2019-08-07: 4 mg via ORAL
  Filled 2019-08-07: qty 1

## 2019-08-07 MED ORDER — METHOCARBAMOL 500 MG PO TABS
500.0000 mg | ORAL_TABLET | Freq: Three times a day (TID) | ORAL | Status: DC | PRN
  Administered 2019-08-08 – 2019-08-09 (×4): 500 mg via ORAL
  Filled 2019-08-07 (×4): qty 1

## 2019-08-07 MED ORDER — VANCOMYCIN HCL IN DEXTROSE 1-5 GM/200ML-% IV SOLN
1000.0000 mg | Freq: Once | INTRAVENOUS | Status: AC
  Administered 2019-08-07: 1000 mg via INTRAVENOUS
  Filled 2019-08-07: qty 200

## 2019-08-07 MED ORDER — PIPERACILLIN-TAZOBACTAM 3.375 G IVPB 30 MIN
3.3750 g | Freq: Once | INTRAVENOUS | Status: AC
  Administered 2019-08-07: 3.375 g via INTRAVENOUS
  Filled 2019-08-07: qty 50

## 2019-08-07 MED ORDER — ACETAMINOPHEN 325 MG PO TABS
650.0000 mg | ORAL_TABLET | Freq: Four times a day (QID) | ORAL | Status: DC | PRN
  Administered 2019-08-08 – 2019-08-10 (×5): 650 mg via ORAL
  Filled 2019-08-07 (×5): qty 2

## 2019-08-07 MED ORDER — CLONIDINE HCL 0.1 MG PO TABS: 0.1000 mg | ORAL_TABLET | Freq: Two times a day (BID) | ORAL | Status: DC

## 2019-08-07 MED ORDER — ACETAMINOPHEN 650 MG RE SUPP: 650.0000 mg | Freq: Four times a day (QID) | RECTAL | Status: DC | PRN

## 2019-08-07 MED ORDER — NAPROXEN 500 MG PO TABS: 500.0000 mg | ORAL_TABLET | Freq: Two times a day (BID) | ORAL | Status: DC | PRN

## 2019-08-07 MED ORDER — PIPERACILLIN-TAZOBACTAM 3.375 G IVPB
3.3750 g | Freq: Three times a day (TID) | INTRAVENOUS | Status: DC
  Administered 2019-08-07 – 2019-08-09 (×5): 3.375 g via INTRAVENOUS
  Filled 2019-08-07 (×5): qty 50

## 2019-08-07 MED ORDER — LACTATED RINGERS IV BOLUS
1000.0000 mL | Freq: Once | INTRAVENOUS | Status: AC
  Administered 2019-08-07: 1000 mL via INTRAVENOUS

## 2019-08-07 MED ORDER — LACTATED RINGERS IV SOLN: INTRAVENOUS | Status: AC

## 2019-08-07 NOTE — Progress Notes (Signed)
A consult was received from an ED physician for vancomycin and zosyn per pharmacy dosing.  The patient's profile has been reviewed for ht/wt/allergies/indication/available labs.   A one time order has been placed for vancomycin 1g and zosyn 3.375g.  Further antibiotics/pharmacy consults should be ordered by admitting physician if indicated.                       Thank you, Loralee Pacas, PharmD, BCPS 08/07/2019  3:53 AM

## 2019-08-07 NOTE — ED Notes (Signed)
Patient provided with lunch tray

## 2019-08-07 NOTE — H&P (Signed)
History and Physical    Robert [name] VEL:[id number] DOB: [birth year] DOA: 08/07/2019  PCP: Patient, No Pcp Per  Patient coming from: Home   Chief Complaint:  Right wrist and hand pain  HPI:   39 year old male with past medical history of intravenous drug abuse, hepatitis C, nicotine dependence who presents to [facility] long hospital with complaints of right hand and wrist pain and redness.  Patient explains that approximately 3 days ago he began to develop right hand and wrist pain.  This was initially mild in intensity.  Pain is sharp in quality and radiates proximally.  In the days that followed patient's pain became moderate to severe in intensity.  Patient denies fevers, weakness, nausea, vomiting or sick contacts.  Upon further questioning patient admits to ongoing intravenous drug abuse including injecting in that region.  Due to patient's progressively worsening symptoms the patient eventually presented to [facility] emergency department for evaluation.  Upon evaluation in the emergency department patient was clinically felt to be suffering from right hand and wrist cellulitis and therefore patient was initiated on intravenous vancomycin and Zosyn.  Bedside ultrasound of the right wrist was performed which revealed no evidence of fluid collection or abscess formation.  The hospitalist group was then called to assess the patient for admission the hospital.    Review of Systems: A 10-system review of systems has been performed and all systems are negative with the exception of what is listed in the HPI.    Past Medical History:  Diagnosis Date  . Anxiety   . Arthritis   . Cellulitis 09/2018  . Chronic hepatitis C (HCC) 08/07/2019  . Depression   . Hepatitis    via pts mother  . 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 Nicotine dependence, cigarettes, uncomplicated 08/07/2019    Past Surgical History:  Procedure Laterality  Date  . GALLBLADDER SURGERY       reports that he has been smoking cigarettes. He has been smoking about 1.00 pack per day. He has never used smokeless tobacco. He reports current alcohol use. He reports current drug use. Drugs: Cocaine, Heroin, and Methamphetamines.  Allergies  Allergen Reactions  . Hydrocodone     nausea    Family History  Problem Relation Age of Onset  . Diabetes Mother   . Diabetes Maternal Grandfather      Prior to Admission medications   Medication Sig Start Date End Date Taking? Authorizing Provider  ibuprofen (ADVIL) 200 MG tablet Take 600-800 mg by mouth every 6 (six) hours as needed for headache or moderate pain.   Yes [provider]  nicotine (NICODERM CQ - DOSED IN MG/24 HOURS) 21 mg/24hr patch Place 21 mg onto the skin daily.   Yes [provider]  DULoxetine (CYMBALTA) 30 MG capsule Take 1 capsule (30 mg total) by mouth daily. Patient not taking: Reported on 08/07/2019 10/23/18   Angelita Ingles, MD    Physical Exam: Vitals:   08/06/19 2358 08/07/19 0454  BP: 140/85 (!) 125/56  Pulse: (!) 103 86  Resp: 18 16  Temp: 98 F (36.7 C)   TempSrc: Oral   SpO2: 97% 99%    Constitutional: Acute alert and oriented x3, patient is in mild distress due to right hand and wrist pain. Skin: Notable redness warmth and induration of the right hand and wrist.  Multiple wounds of the upper extremities in various stages of healing consistent with track marks with additional scarring noted  in multiple areas of the bilateral upper extremities.  Poor skin turgor noted.   Eyes: Pupils are equally reactive to light.  No evidence of scleral icterus or conjunctival pallor.  ENMT: Dry mucous membranes noted.  Posterior pharynx clear of any exudate or lesions.   Neck: normal, supple, no masses, no thyromegaly.  No evidence of jugular venous distension.   Respiratory: clear to auscultation bilaterally, no wheezing, no crackles. Normal respiratory  effort. No accessory muscle use.  Cardiovascular: Regular rate and rhythm, no murmurs / rubs / gallops.  Mild edema of the distal right upper extremity.  2+ pedal pulses. No carotid bruits.  Chest:   Nontender without crepitus or deformity.   Back:   Nontender without crepitus or deformity. Abdomen: Abdomen is soft and nontender.  No evidence of intra-abdominal masses.  Positive bowel sounds noted in all quadrants.   Musculoskeletal: Notable tenderness of the right hand and wrist.  Despite this, patient still exhibits good range of motion of the affected area. no contractures. Normal muscle tone.  Neurologic: CN 2-12 grossly intact. Sensation intact, patient is moving all 4 extremities spontaneously.  Patient is following all commands.  Patient is responsive to verbal stimuli.   Psychiatric: Patient exhibits a normal mood with labile affect.  Patient seems to possess insight as to theircurrent situation.     Labs on Admission: I have personally reviewed following labs and imaging studies -   CBC: Recent Labs  Lab 08/07/19 0226  WBC 7.7  NEUTROABS 4.5  HGB 14.2  HCT 42.4  MCV 92.6  PLT 252   Basic Metabolic Panel: Recent Labs  Lab 08/07/19 0226  NA 135  K 3.9  CL 96*  CO2 29  GLUCOSE 100*  BUN 10  CREATININE 0.75  CALCIUM 9.1   GFR: Estimated Creatinine Clearance: 129.6 mL/min (by C-G formula based on SCr of 0.75 mg/dL). Liver Function Tests: Recent Labs  Lab 08/07/19 0226  AST 197*  ALT 358*  ALKPHOS 109  BILITOT 0.8  PROT 8.0  ALBUMIN 4.0   No results for input(s): LIPASE, AMYLASE in the last 168 hours. No results for input(s): AMMONIA in the last 168 hours. Coagulation Profile: No results for input(s): INR, PROTIME in the last 168 hours. Cardiac Enzymes: No results for input(s): CKTOTAL, CKMB, CKMBINDEX, TROPONINI in the last 168 hours. BNP (last 3 results) No results for input(s): PROBNP in the last 8760 hours. HbA1C: No results for input(s): HGBA1C in the  last 72 hours. CBG: No results for input(s): GLUCAP in the last 168 hours. Lipid Profile: No results for input(s): CHOL, HDL, LDLCALC, TRIG, CHOLHDL, LDLDIRECT in the last 72 hours. Thyroid Function Tests: No results for input(s): TSH, T4TOTAL, FREET4, T3FREE, THYROIDAB in the last 72 hours. Anemia Panel: No results for input(s): VITAMINB12, FOLATE, FERRITIN, TIBC, IRON, RETICCTPCT in the last 72 hours. Urine analysis:    Component Value Date/Time   COLORURINE YELLOW 03/01/2018 0139   APPEARANCEUR CLEAR 03/01/2018 0139   LABSPEC 1.014 03/01/2018 0139   PHURINE 5.0 03/01/2018 0139   GLUCOSEU NEGATIVE 03/01/2018 0139   HGBUR NEGATIVE 03/01/2018 0139   BILIRUBINUR NEGATIVE 03/01/2018 0139   KETONESUR NEGATIVE 03/01/2018 0139   PROTEINUR NEGATIVE 03/01/2018 0139   NITRITE NEGATIVE 03/01/2018 0139   LEUKOCYTESUR NEGATIVE 03/01/2018 0139    Radiological Exams on Admission - Personally Reviewed: DG Wrist Complete Right  Result Date: 08/07/2019 CLINICAL DATA:  Wrist pain. EXAM: RIGHT WRIST - COMPLETE 3+ VIEW COMPARISON:  None. FINDINGS: There is no  evidence of fracture or dislocation. There is no evidence of arthropathy or other focal bone abnormality. There is extensive soft tissue swelling about the hand. There is an old healed fifth metacarpal fracture. IMPRESSION: Extensive soft tissue swelling about the hand without underlying acute osseous abnormality. Electronically Signed   By: Constance Holster M.D.   On: 08/07/2019 01:52    Assessment/Plan Active Problems:   Cellulitis of hand and wrist, right   Patient is developed progressively worsening cellulitis of the right hand and wrist likely secondary to continued intravenous drug abuse with injections around that site.  Right wrist x-ray reveals no evidence of osteomyelitis  Bedside ultrasound performed in the emergency department reveals no obvious evidence of fluid in the joint or abscess  Clinically, cellulitis is not  particularly severe.  Patient exhibits reasonably good range of motion with no perceived right wrist effusion.  I feel that septic arthritis is extremely unlikely and do not believe that orthopedic surgery needs to be called at this time.    To more closely evaluate for any evidence of underlying osteomyelitis or joint fluid collection, will obtain MRI of the right wrist.    The patient has been placed on intravenous vancomycin   Hydrating patient with intravenous isotonic fluids.    Due to patient's desire to detox, will attempt initially to treat patient's pain with as needed intravenous Toradol although if pain remains uncontrolled we may unfortunately have to administer short courses of intravenous opiates later in the hospitalization.  Intravenous drug abuse ([place])   Counseling patient on cessation  Patient voices his desire to quit and to detox  Will place case management referral  We will consider initiation of clonidine when patient begins to exhibit withdrawal symptoms    Nicotine dependence, cigarettes, uncomplicated  Counseled patient on cessation  Patient with nicotine patches for nicotine replacement therapy   Code Status:  Full code Family Communication: deferred   Status is: Observation  The patient remains OBS appropriate and will d/c before 2 midnights.  Dispo: The patient is from: Home              Anticipated d/c is to: Home              Anticipated d/c date is: 2 days              Patient currently is not medically stable to d/c.        Vernelle Emerald MD [facility] Pager [phone]  If 7PM-7AM, please contact night-coverage [email or web address] Use universal [place] password for that web site. If you do not have the password, please call the hospital operator.  08/07/2019, 5:26 AM

## 2019-08-07 NOTE — ED Notes (Signed)
Called to give report, no answer.

## 2019-08-07 NOTE — ED Notes (Signed)
Pt transported to MRI 

## 2019-08-07 NOTE — Progress Notes (Signed)
Pharmacy Antibiotic Note  Robert [name] is a 39 y.o. male admitted on 08/07/2019 with cellulitis.  Pharmacy has been consulted for vancomycin dosing.  Plan: Vancomycin 1500mg  IV q12h for trough goal of 10-15 Zosyn 3.375g IV Q8H infused over 4hrs per MD Daily Scr while on vanc and zosyn      Temp (24hrs), Avg:98 F (36.7 C), Min:98 F (36.7 C), Max:98 F (36.7 C)  Recent Labs  Lab 08/07/19 0226 08/07/19 0227  WBC 7.7  --   CREATININE 0.75  --   LATICACIDVEN  --  1.5    CrCl cannot be calculated (Unknown ideal weight.).    Allergies  Allergen Reactions  . Hydrocodone     nausea     Thank you for allowing pharmacy to be a part of this patient's care.  10/07/19 RPh 08/07/2019, 7:09 PM

## 2019-08-07 NOTE — ED Notes (Signed)
Pt ambulated to x-ray.

## 2019-08-07 NOTE — ED Provider Notes (Addendum)
Robert [name] Provider Note   CSN: [id number] Arrival date & time: 08/06/19  2347     History Chief Complaint  Patient presents with  . Abscess    Robert [name] is a 39 y.o. male with a history of polysubstance abuse, IV drug use disorder with methamphetamines, cocaine, heroin, and fentanyl who presents to the emergency department with chief complaint of right wrist pain.  The patient endorses constant, worsening redness, warmth, edema to the right wrist that has been worsening since onset.  He is unsure of how long his symptoms have been ongoing, but suspects it has been approximately 2 weeks.  He reports that he has had some pain and infection to the right wrist before, but symptoms have improved.  He reports that over the last 44 hours that pain in the area has significantly worsened.  He also has redness and warmth noted to right hand and right forearm, but he is unsure of when the symptoms began or when they began to worsen.  No recent fever, chills, chest pain, back pain, shortness of breath, numbness, weakness, abdominal pain.  No treatment prior to arrival.  He endorses fentanyl use just prior to arrival.  States that he has also used cocaine and methamphetamine multiple times this week.  States that he has not slept for the last 2 nights. He had 1 alcoholic drink this morning.  However, he injects in his hands and his legs, but will inject "anywhere he can find a vein".  He is unsure if he has infiltrated while using drugs recently, but suspects that he has.  History is limited as the patient appears acutely intoxicated.  States that he received a second COVID-19 vaccination 2 days ago.  The history is provided by the patient and medical records. No language interpreter was used.       Past Medical History:  Diagnosis Date  . Anxiety   . Arthritis   . Cellulitis 09/2018  . Depression   . Hepatitis    via pts mother    Patient Active  Problem List   Diagnosis Date Noted  . Cellulitis of leg, right   . Cellulitis and abscess of upper extremity 02/28/2018  . Cocaine use disorder, moderate, dependence (HCC) 07/07/2017  . 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 Substance induced mood disorder (HCC) 03/08/2017  . Alcoholism (HCC) 09/12/2014    Past Surgical History:  Procedure Laterality Date  . GALLBLADDER SURGERY         Family History  Problem Relation Age of Onset  . Diabetes Mother   . Diabetes Maternal Grandfather     Social History   Tobacco Use  . Smoking status: Current Every Day Smoker    Types: Cigarettes  . Smokeless tobacco: Never Used  Vaping Use  . Vaping Use: Some days  Substance Use Topics  . Alcohol use: Yes    Alcohol/week: 0.0 standard drinks  . Drug use: Yes    Types: Cocaine, Heroin, Methamphetamines    Home Medications Prior to Admission medications   Medication Sig Start Date End Date Taking? Authorizing Provider  ibuprofen (ADVIL) 200 MG tablet Take 600-800 mg by mouth every 6 (six) hours as needed for headache or moderate pain.   Yes [provider]  nicotine (NICODERM CQ - DOSED IN MG/24 HOURS) 21 mg/24hr patch Place 21 mg onto the skin daily.  Yes [provider]  DULoxetine (CYMBALTA) 30 MG capsule Take 1 capsule (30 mg total) by mouth daily. Patient not taking: Reported on 08/07/2019 10/23/18   Angelita Ingles, MD    Allergies    Hydrocodone  Review of Systems   Review of Systems  Constitutional: Negative for appetite change, chills and fever.  Respiratory: Negative for shortness of breath.   Cardiovascular: Negative for chest pain.  Gastrointestinal: Negative for abdominal pain, diarrhea, nausea and vomiting.  Genitourinary: Negative for dysuria.  Musculoskeletal: Positive for arthralgias and joint swelling.  Negative for back pain.  Skin: Positive for color change and wound. Negative for rash.  Allergic/Immunologic: Negative for immunocompromised state.  Neurological: Negative for seizures, syncope, weakness, numbness and headaches.  Psychiatric/Behavioral: Negative for confusion.    Physical Exam Updated Vital Signs BP 140/85 (BP Location: Left Arm)   Pulse (!) 103   Temp 98 F (36.7 C) (Oral)   Resp 18   SpO2 97%   Physical Exam Vitals and nursing note reviewed.  Constitutional:      Appearance: He is well-developed.     Comments: Appears acutely intoxicated and is tweaking  HENT:     Head: Normocephalic.  Eyes:     Conjunctiva/sclera: Conjunctivae normal.  Cardiovascular:     Rate and Rhythm: Normal rate and regular rhythm.     Heart sounds: No murmur heard.   Pulmonary:     Effort: Pulmonary effort is normal. No respiratory distress.     Breath sounds: No stridor. No wheezing, rhonchi or rales.  Chest:     Chest wall: No tenderness.  Abdominal:     General: There is no distension.     Palpations: Abdomen is soft. There is no mass.     Tenderness: There is no abdominal tenderness. There is no right CVA tenderness, left CVA tenderness, guarding or rebound.     Hernia: No hernia is present.  Musculoskeletal:        General: Tenderness present.     Cervical back: Neck supple.     Comments: Erythema and warmth noted to the entire right hand and all 5 digits that extends across the wrist and to the proximal half of the right forearm.  There is also induration noted over the dorsum of the right hand and wrist.  No obvious fluctuance.  Erythema, induration, and warmth are not circumferential.  There is an overlying wound to the dorsum of the right wrist.  No active drainage. He is diffusely tender to palpation to the right wrist.  He has full active and passive range of motion, but it is painful.  No tenderness to the palmar surface of the hand or digits.  He has full active and  passive range of motion of all digits in the right hand.  There is no red streaking up the right arm.  He is neurovascularly intact.  Normal exam of the right elbow.   Skin:    General: Skin is warm and dry.     Comments:  Numerous track marks are noted to the bilateral upper extremities.  Neurological:     Mental Status: He is alert.  Psychiatric:        Behavior: Behavior normal.       ED Results / Procedures / Treatments   Labs (all labs ordered are listed, but only abnormal results are displayed) Labs Reviewed  COMPREHENSIVE METABOLIC PANEL - Abnormal; Notable for the following components:      Result Value  Chloride 96 (*)    Glucose, Bld 100 (*)    AST 197 (*)    ALT 358 (*)    All other components within normal limits  C-REACTIVE PROTEIN - Abnormal; Notable for the following components:   CRP 3.2 (*)    All other components within normal limits  SARS CORONAVIRUS 2 BY RT PCR (HOSPITAL ORDER, PERFORMED IN [facility])  CULTURE, BLOOD (ROUTINE X 2)  CULTURE, BLOOD (ROUTINE X 2)  CBC WITH DIFFERENTIAL/PLATELET  SEDIMENTATION RATE  ETHANOL  LACTIC ACID, PLASMA  RAPID URINE DRUG SCREEN, HOSP PERFORMED    EKG None  Radiology DG Wrist Complete Right  Result Date: 08/07/2019 CLINICAL DATA:  Wrist pain. EXAM: RIGHT WRIST - COMPLETE 3+ VIEW COMPARISON:  None. FINDINGS: There is no evidence of fracture or dislocation. There is no evidence of arthropathy or other focal bone abnormality. There is extensive soft tissue swelling about the hand. There is an old healed fifth metacarpal fracture. IMPRESSION: Extensive soft tissue swelling about the hand without underlying acute osseous abnormality. Electronically Signed   By: Katherine Mantle M.D.   On: 08/07/2019 01:52    Procedures Ultrasound ED Soft Tissue  Date/Time: 08/07/2019 3:53 AM Performed by: Barkley Boards, PA-C Authorized by: Barkley Boards, PA-C   Procedure details:    Indications: limb pain       Transverse view:  Visualized   Longitudinal view:  Visualized   Images: archived   Location:    Location: upper extremity     Side:  Right Findings:     no abscess present    cellulitis present    no foreign body present   (including critical care time)  Medications Ordered in ED Medications  piperacillin-tazobactam (ZOSYN) IVPB 3.375 g (has no administration in time range)  vancomycin (VANCOCIN) IVPB 1000 mg/200 mL premix (has no administration in time range)  cloNIDine (CATAPRES) tablet 0.1 mg (has no administration in time range)    Followed by  cloNIDine (CATAPRES) tablet 0.1 mg (has no administration in time range)    Followed by  cloNIDine (CATAPRES) tablet 0.1 mg (has no administration in time range)  dicyclomine (BENTYL) tablet 20 mg (has no administration in time range)  hydrOXYzine (ATARAX/VISTARIL) tablet 25 mg (has no administration in time range)  loperamide (IMODIUM) capsule 2-4 mg (has no administration in time range)  methocarbamol (ROBAXIN) tablet 500 mg (has no administration in time range)  naproxen (NAPROSYN) tablet 500 mg (has no administration in time range)  ondansetron (ZOFRAN-ODT) disintegrating tablet 4 mg (has no administration in time range)    ED Course  I have reviewed the triage vital signs and the nursing notes.  Pertinent labs & imaging results that were available during my care of the patient were reviewed by me and considered in my medical decision making (see chart for details).    MDM Rules/Calculators/A&P                          39 year old male with a history of polysubstance abuse, IV drug use disorder with methamphetamines, cocaine, heroin, and fentanyl who presents to the emergency department with infection of the right upper extremity.  There is redness and warmth noted throughout the dorsum of the right hand and proximal half of the right forearm with associated warmth.  No significant induration, but there does appear to be a  wound and some fluctuance noted to the dorsum of the right wrist.  He has tender to palpation to the right wrist.  He does have full active and passive range of motion, but it is painful.  No constitutional symptoms.  Patient also appears to be acutely intoxicated and reports methamphetamine use earlier in the day as well as fentanyl use just prior to arrival.  He has a history of alcohol use disorder and reports 1 alcoholic beverage this morning.  Mildly tachycardic on arrival.  Afebrile.  Vital signs are otherwise unremarkable.  Will order x-ray of the right wrist, labs, and blood cultures.  He will require admission for cellulitis of the right upper extremity.  However, there is concern for abscess, but since this is overlying the wrist and pain is significantly worsened over the last day with painful range of motion of the right wrist, there is concern for developing septic arthritis.  Less likely osteomyelitis.  X-ray of the right wrist with extensive soft tissue swelling around the hand without's underlying bony changes.  No effusion of the right wrist noted on my read arm x-ray.  Bedside ultrasound with cobblestoning consistent with cellulitis, but no fluid collections consistent with abscess.  No leukocytosis.  Lactate is normal.  CRP is 3.2.  Sed rate is normal.  No electrolyte derangements.  He does have elevated transaminases, 197 and 358 of the AST and ALT, respectively.  I suspect this is secondary to untreated hepatitis C.  He is having no abdominal pain.  I have a low suspicion for Tylenol overdose or viral hepatitis.  On re-evaluation, patient's range of motion of the right wrist is improved.  Lower suspicion for septic arthritis, but given that he is having cellulitis that is crossing a joint, will start him on Zosyn and vancomycin.  On re-evaluation, patient no longer acutely appears intoxicated.  He has been pacing the hallway for more than an hour.  Will start the patient on clonidine  detox protocol since he will require admission for IV antibiotics for cellulitis.  The patient was discussed and independently evaluated by Dr. Randal Buba, attending physician.  Consult to the hospitalist team,Dr, Cyd Silence, who will accept the patient for admission. The patient appears reasonably stabilized for admission considering the current resources, flow, and capabilities available in the ED at this time, and I doubt any other [facility] requiring further screening and/or treatment in the ED prior to admission.  Final Clinical Impression(s) / ED Diagnoses Final diagnoses:  Right arm cellulitis    Rx / DC Orders ED Discharge Orders    None       Joanne Gavel, PA-C 08/07/19 0416    Palumbo, April, MD 08/07/19 0504    Joanne Gavel, PA-C 08/07/19 0528    Palumbo, April, MD 08/07/19 0532

## 2019-08-07 NOTE — Progress Notes (Signed)
39 year old male admitted this morning by my partner otitis C, IV drug abuse admitted with right hand pain. Patient admitted with right hand cellulitis due to ongoing IV drug use with injections to the right hand right wrist area.  X-ray showed no evidence of osteomyelitis, ultrasound showed no evidence of fluid collection or abscess. Patient started on vancomycin and Zosyn. MRI of the right hand--Severe soft tissue edema and enhancement along the dorsal aspect of the wrist and extending into the hand most concerning for severe cellulitis. No drainable fluid collection to suggest an abscess.  Small amount of fluid in the extensor carpi ulnaris, extensor digitorum, and extensor carpi radialis brevis and longus tendon sheaths likely reflecting mild tenosynovitis which may be secondary to infectious etiology or reactive etiology given the adjacent severe cellulitis. Continue IV antibiotics.

## 2019-08-08 ENCOUNTER — Inpatient Hospital Stay (HOSPITAL_COMMUNITY): Payer: Self-pay

## 2019-08-08 DIAGNOSIS — L03113 Cellulitis of right upper limb: Secondary | ICD-10-CM

## 2019-08-08 DIAGNOSIS — R609 Edema, unspecified: Secondary | ICD-10-CM

## 2019-08-08 HISTORY — DX: Cellulitis of right upper limb: L03.113

## 2019-08-08 LAB — COMPREHENSIVE METABOLIC PANEL
ALT: 244 U/L — ABNORMAL HIGH (ref 0–44)
AST: 124 U/L — ABNORMAL HIGH (ref 15–41)
Albumin: 3.2 g/dL — ABNORMAL LOW (ref 3.5–5.0)
Alkaline Phosphatase: 91 U/L (ref 38–126)
Anion gap: 10 (ref 5–15)
BUN: 11 mg/dL (ref 6–20)
CO2: 23 mmol/L (ref 22–32)
Calcium: 8.9 mg/dL (ref 8.9–10.3)
Chloride: 105 mmol/L (ref 98–111)
Creatinine, Ser: 0.71 mg/dL (ref 0.61–1.24)
GFR calc Af Amer: >60 mL/min (ref >60)
GFR calc non Af Amer: >60 mL/min (ref >60)
Glucose, Bld: 109 mg/dL — ABNORMAL HIGH (ref 70–99)
Potassium: 4 mmol/L (ref 3.5–5.1)
Sodium: 138 mmol/L (ref 135–145)
Total Bilirubin: 0.7 mg/dL (ref 0.3–1.2)
Total Protein: 6.6 g/dL (ref 6.5–8.1)

## 2019-08-08 LAB — CBC WITH DIFFERENTIAL/PLATELET
Abs Immature Granulocytes: 0.02 10*3/uL (ref 0.00–0.07)
Basophils Absolute: 0.1 10*3/uL (ref 0.0–0.1)
Basophils Relative: 1 %
Eosinophils Absolute: 0.5 10*3/uL (ref 0.0–0.5)
Eosinophils Relative: 9 %
HCT: 40.6 % (ref 39.0–52.0)
Hemoglobin: 13.2 g/dL (ref 13.0–17.0)
Immature Granulocytes: 0 %
Lymphocytes Relative: 41 %
Lymphs Abs: 2.4 10*3/uL (ref 0.7–4.0)
MCH: 30.6 pg (ref 26.0–34.0)
MCHC: 32.5 g/dL (ref 30.0–36.0)
MCV: 94 fL (ref 80.0–100.0)
Monocytes Absolute: 0.7 10*3/uL (ref 0.1–1.0)
Monocytes Relative: 12 %
Neutro Abs: 2.2 10*3/uL (ref 1.7–7.7)
Neutrophils Relative %: 37 %
Platelets: 240 10*3/uL (ref 150–400)
RBC: 4.32 MIL/uL (ref 4.22–5.81)
RDW: 13.5 % (ref 11.5–15.5)
WBC: 5.9 10*3/uL (ref 4.0–10.5)
nRBC: 0 % (ref 0.0–0.2)

## 2019-08-08 MED ORDER — NICOTINE 7 MG/24HR TD PT24
7.0000 mg | MEDICATED_PATCH | Freq: Every day | TRANSDERMAL | Status: DC
  Administered 2019-08-08 – 2019-08-10 (×3): 7 mg via TRANSDERMAL
  Filled 2019-08-08 (×3): qty 1

## 2019-08-08 MED ORDER — OXYCODONE HCL 5 MG PO TABS
5.0000 mg | ORAL_TABLET | ORAL | Status: DC | PRN
  Administered 2019-08-08 – 2019-08-10 (×12): 5 mg via ORAL
  Filled 2019-08-08 (×12): qty 1

## 2019-08-08 MED ORDER — SODIUM CHLORIDE 0.9 % IV SOLN
INTRAVENOUS | Status: DC | PRN
  Administered 2019-08-08: 500 mL via INTRAVENOUS

## 2019-08-08 NOTE — Progress Notes (Addendum)
PROGRESS NOTE    Robert [name]  HBZ:[id number] DOB: 16-Oct-1980 DOA: 08/07/2019 PCP: Patient, No Pcp Per    Brief Narrative: 39 year old male with past medical history of intravenous drug abuse, hepatitis C, nicotine dependence who presents to [facility] long hospital with complaints of right hand and wrist pain and redness.  Patient explains that approximately 3 days ago he began to develop right hand and wrist pain.  This was initially mild in intensity.  Pain is sharp in quality and radiates proximally.  In the days that followed patient's pain became moderate to severe in intensity.  Patient denies fevers, weakness, nausea, vomiting or sick contacts.  Upon further questioning patient admits to ongoing intravenous drug abuse including injecting in that region.  Due to patient's progressively worsening symptoms the patient eventually presented to [facility] emergency department for evaluation.  Upon evaluation in the emergency department patient was clinically felt to be suffering from right hand and wrist cellulitis and therefore patient was initiated on intravenous vancomycin and Zosyn.  Bedside ultrasound of the right wrist was performed which revealed no evidence of fluid collection or abscess formation.  The hospitalist group was then called to assess the patient for admission the hospital.  Assessment & Plan:   Active Problems:   Intravenous drug abuse (HCC)   Cellulitis of hand, right   Nicotine dependence, cigarettes, uncomplicated   Right arm cellulitis   #1 RUE cellulitis-patient with history of IV drug abuse secondary injecting IV drugs to the right wrist.  X-ray showed no evidence of osteomyelitis. MRI significant for-severe soft tissue edema and enhancement along the dorsal aspect of the wrist as tingling into the hand most concerning for severe cellulitis.  There was no drainable fluid collection or abscess noted.  Small amount of fluid in the extensor carpi ulnaris  extensor digitorum extensor carpi radialis brevis and longus tendon sheaths likely reflecting mild tenosynovitis which may be secondary to infection and given adjacent to severe cellulitis. Patient continues with severe right upper extremity pain requiring IV Toradol and erythema and edema. He is unable to wiggle his fingers due to pain. Encouraged him to keep his right upper extremity elevated and pillow as much as possible to decrease the amount of swelling. Explained to him that this can take weeks to get better as long as he stops injecting drug to his right upper extremity. Continue vancomycin and Zosyn. Discussed with staff to keep his right upper extremity elevated on pillows Check vascular Doppler of the right upper extremity to rule out DVT no dvt dw dr Percell Miller ortho advised to continue antibiotics   #2 polysubstance abuse urine drug screen positive for THC cocaine and amphetamines. Consulted Theme park manager for referral for detox as patient now desires to quit.  #3 tobacco use continue nicotine patch.  #4 history of hepatitis C unknown if this is treated or active.  Patient does not have a primary care physician.  [facility] upon [facility].  He does not have insurance. Meanwhile I will send hepatitis panel.  #5 elevated LFTs check hepatitis panel. AST 124 down from 197 ALT 244 down from 358 Alk phos 91 Total bili normal   Estimated body mass index is 33.28 kg/m as calculated from the following:   Height as of 07/24/19: 5' 5"  (1.651 m).   Weight as of 07/24/19: 90.7 kg.  DVT prophylaxis: Lovenox Code Status: Full code Family Communication: None  disposition Plan:  Status is: Observation  Dispo: The  patient is from: Home              Anticipated d/c is to: Home              Anticipated d/c date is: 1 to 2 days              Patient currently is not medically stable to d/c.  Admitted with severe right upper extremity cellulitis with  IV drug abuse    Consultants:   None  Procedures: None Antimicrobials: Vanco and Zosyn  Subjective: Patient complains of 10 out of 10 pain in the right upper extremity with swelling and erythema.  Objective: Vitals:   08/07/19 1601 08/07/19 2135 08/08/19 0220 08/08/19 0555  BP: 124/64 121/64 118/77 112/74  Pulse: 72 64 (!) 55 (!) 57  Resp: 15 16 15 16   Temp: 98 F (36.7 C) 97.8 F (36.6 C) 97.9 F (36.6 C) 97.7 F (36.5 C)  TempSrc: Oral Oral Oral Oral  SpO2: 100% 100% 98% 98%    Intake/Output Summary (Last 24 hours) at 08/08/2019 1045 Last data filed at 08/08/2019 0600 Gross per 24 hour  Intake 1233.32 ml  Output --  Net 1233.32 ml   There were no vitals filed for this visit.  Examination:  General exam: Appears calm and comfortable  Respiratory system: Clear to auscultation. Respiratory effort normal. Cardiovascular system: S1 & S2 heard, RRR. No JVD, murmurs, rubs, gallops or clicks. No pedal edema. Gastrointestinal system: Abdomen is nondistended, soft and nontender. No organomegaly or masses felt. Normal bowel sounds heard. Central nervous system: Alert and oriented. No focal neurological deficits. Extremities: Right upper extremity swollen tender erythematous from the tip of the fingers to just below the right elbow. Skin: No rashes, lesions or ulcers Psychiatry: Judgement and insight appear normal. Mood & affect appropriate.     Data Reviewed: I have personally reviewed following labs and imaging studies  CBC: Recent Labs  Lab 08/07/19 0226 08/08/19 0441  WBC 7.7 5.9  NEUTROABS 4.5 2.2  HGB 14.2 13.2  HCT 42.4 40.6  MCV 92.6 94.0  PLT 252 856   Basic Metabolic Panel: Recent Labs  Lab 08/07/19 0226 08/08/19 0441  NA 135 138  K 3.9 4.0  CL 96* 105  CO2 29 23  GLUCOSE 100* 109*  BUN 10 11  CREATININE 0.75 0.71  CALCIUM 9.1 8.9   GFR: CrCl cannot be calculated (Unknown ideal weight.). Liver Function Tests: Recent Labs  Lab  08/07/19 0226 08/08/19 0441  AST 197* 124*  ALT 358* 244*  ALKPHOS 109 91  BILITOT 0.8 0.7  PROT 8.0 6.6  ALBUMIN 4.0 3.2*   No results for input(s): LIPASE, AMYLASE in the last 168 hours. No results for input(s): AMMONIA in the last 168 hours. Coagulation Profile: No results for input(s): INR, PROTIME in the last 168 hours. Cardiac Enzymes: No results for input(s): CKTOTAL, CKMB, CKMBINDEX, TROPONINI in the last 168 hours. BNP (last 3 results) No results for input(s): PROBNP in the last 8760 hours. HbA1C: No results for input(s): HGBA1C in the last 72 hours. CBG: No results for input(s): GLUCAP in the last 168 hours. Lipid Profile: No results for input(s): CHOL, HDL, LDLCALC, TRIG, CHOLHDL, LDLDIRECT in the last 72 hours. Thyroid Function Tests: No results for input(s): TSH, T4TOTAL, FREET4, T3FREE, THYROIDAB in the last 72 hours. Anemia Panel: No results for input(s): VITAMINB12, FOLATE, FERRITIN, TIBC, IRON, RETICCTPCT in the last 72 hours. Sepsis Labs: Recent Labs  Lab 08/07/19 0227  LATICACIDVEN  1.5    Recent Results (from the past 240 hour(s))  SARS Coronavirus 2 by RT PCR (hospital order, performed in [facility] hospital lab) Nasopharyngeal Nasopharyngeal Swab     Status: None   Collection Time: 08/07/19  2:27 AM   Specimen: Nasopharyngeal Swab  Result Value Ref Range Status   SARS Coronavirus 2 NEGATIVE NEGATIVE Final    Comment: (NOTE) SARS-CoV-2 target nucleic acids are NOT DETECTED.  The SARS-CoV-2 RNA is generally detectable in upper and lower respiratory specimens during the acute phase of infection. The lowest concentration of SARS-CoV-2 viral copies this assay can detect is 250 copies / mL. A negative result does not preclude SARS-CoV-2 infection and should not be used as the sole basis for treatment or other patient management decisions.  A negative result may occur with improper specimen collection / handling, submission of specimen other than  nasopharyngeal swab, presence of viral mutation(s) within the areas targeted by this assay, and inadequate number of viral copies (<250 copies / mL). A negative result must be combined with clinical observations, patient history, and epidemiological information.  Fact Sheet for Patients:   StrictlyIdeas.no  Fact Sheet for Healthcare Providers: BankingDealers.co.za  This test is not yet approved or  cleared by the [place] FDA and has been authorized for detection and/or diagnosis of SARS-CoV-2 by FDA under an Emergency Use Authorization (EUA).  This EUA will remain in effect (meaning this test can be used) for the duration of the COVID-19 declaration under Section 564(b)(1) of the Act, 21 U.S.C. section 360bbb-3(b)(1), unless the authorization is terminated or revoked sooner.  Performed at [facility], [place] [address] [place], [place] [address]   Blood culture (routine x 2)     Status: None (Preliminary result)   Collection Time: 08/07/19  2:30 AM   Specimen: BLOOD RIGHT FOREARM  Result Value Ref Range Status   Specimen Description   Final    BLOOD RIGHT FOREARM Performed at [place] [address], [place], [place] [address]    Special Requests   Final    BOTTLES DRAWN AEROBIC AND ANAEROBIC Blood Culture results may not be optimal due to an excessive volume of blood received in culture bottles Performed at [place] [address], [place], [place] [address]    Culture   Final    NO GROWTH 1 DAY Performed at [facility], [place] [address] [place], [place] [address]    Report Status PENDING  Incomplete  Blood culture (routine x 2)     Status: None (Preliminary result)   Collection Time: 08/07/19  2:31 AM   Specimen: BLOOD LEFT FOREARM  Result Value Ref Range Status   Specimen Description   Final    BLOOD LEFT FOREARM Performed at [place] [address], [place], [place] [address]    Special Requests   Final    BOTTLES DRAWN AEROBIC AND ANAEROBIC Blood Culture adequate volume Performed at [place] [address], [place], [place] [address]    Culture   Final    NO GROWTH 1 DAY Performed at [facility], [place] [address] [place], [place] [address]    Report Status PENDING  Incomplete         Radiology Studies: DG Wrist Complete Right  Result Date: 08/07/2019 CLINICAL DATA:  Wrist pain. EXAM: RIGHT WRIST - COMPLETE 3+ VIEW COMPARISON:  None. FINDINGS: There is no evidence of fracture  or dislocation. There is no evidence of arthropathy or other focal bone abnormality. There is extensive soft tissue swelling about the hand. There is an old healed fifth metacarpal fracture. IMPRESSION: Extensive soft tissue swelling about the hand without underlying acute osseous abnormality. Electronically Signed   By: Constance Holster M.D.   On: 08/07/2019 01:52   MR WRIST RIGHT W WO CONTRAST  Result Date: 08/07/2019 CLINICAL DATA:  IV drug user. Right hand and wrist pain. EXAM: MR OF THE RIGHT WRIST WITHOUT AND WITH CONTRAST TECHNIQUE: Multiplanar multisequence MR imaging of the right wrist was performed both before and after the administration of intravenous contrast. CONTRAST:  53m GADAVIST GADOBUTROL 1 MMOL/ML IV SOLN COMPARISON:  None. FINDINGS: Extremely limited examination secondary to patient motion degrading image quality. Ligaments: Intact scapholunate and lunotriquetral ligaments. Triangular fibrocartilage: Intact TFCC. Tendons: Intact flexor compartment tendons. Mild tendinosis of the extensor carpi ulnaris tendon. Remainder the extensor compartment tendons are intact. Small amount of fluid in the extensor carpi ulnaris, extensor digitorum, and extensor carpi radialis brevis and longus tendon sheaths likely reflecting mild tenosynovitis which may be secondary to infectious etiology or  reactive etiology given the adjacent severe cellulitis. Carpal tunnel/median nerve: Normal carpal tunnel. Normal median nerve. Guyon's canal: Normal. Joint/cartilage: No joint effusion. No chondral defect. No synovitis. Bones/carpal alignment: No aggressive osseous lesion. No periosteal reaction or bone destruction. Normal alignment. Other: Severe soft tissue edema and enhancement along the dorsal aspect of the wrist and extending into the hand most concerning for severe cellulitis. No drainable fluid collection to suggest an abscess. IMPRESSION: 1. Severe soft tissue edema and enhancement along the dorsal aspect of the wrist and extending into the hand most concerning for severe cellulitis. No drainable fluid collection to suggest an abscess. 2. Small amount of fluid in the extensor carpi ulnaris, extensor digitorum, and extensor carpi radialis brevis and longus tendon sheaths likely reflecting mild tenosynovitis which may be secondary to infectious etiology or reactive etiology given the adjacent severe cellulitis. Electronically Signed   By: HKathreen Devoid  On: 08/07/2019 08:01        Scheduled Meds: . enoxaparin (LOVENOX) injection  40 mg Subcutaneous Q24H  . pantoprazole  40 mg Oral Daily   Continuous Infusions: . piperacillin-tazobactam (ZOSYN)  IV 3.375 g (08/08/19 0427)  . vancomycin 1,500 mg (08/08/19 0726)     LOS: 0 days     EGeorgette Shell MD  08/08/2019, 10:45 AM

## 2019-08-08 NOTE — Progress Notes (Signed)
Verbal orders given by Jerelene Redden, MD for a nicotine patch 7 mg once daily.

## 2019-08-08 NOTE — Progress Notes (Signed)
Upper venous duplex       has been completed. Preliminary results can be found under CV proc through chart review. Dakari Stabler, BS, RDMS, RVT   

## 2019-08-09 DIAGNOSIS — L03113 Cellulitis of right upper limb: Principal | ICD-10-CM

## 2019-08-09 LAB — COMPREHENSIVE METABOLIC PANEL
ALT: 203 U/L — ABNORMAL HIGH (ref 0–44)
AST: 88 U/L — ABNORMAL HIGH (ref 15–41)
Albumin: 3.3 g/dL — ABNORMAL LOW (ref 3.5–5.0)
Alkaline Phosphatase: 86 U/L (ref 38–126)
Anion gap: 9 (ref 5–15)
BUN: 9 mg/dL (ref 6–20)
CO2: 26 mmol/L (ref 22–32)
Calcium: 9 mg/dL (ref 8.9–10.3)
Chloride: 105 mmol/L (ref 98–111)
Creatinine, Ser: 0.7 mg/dL (ref 0.61–1.24)
GFR calc Af Amer: >60 mL/min (ref >60)
GFR calc non Af Amer: >60 mL/min (ref >60)
Glucose, Bld: 113 mg/dL — ABNORMAL HIGH (ref 70–99)
Potassium: 3.5 mmol/L (ref 3.5–5.1)
Sodium: 140 mmol/L (ref 135–145)
Total Bilirubin: 0.6 mg/dL (ref 0.3–1.2)
Total Protein: 6.9 g/dL (ref 6.5–8.1)

## 2019-08-09 LAB — CBC
HCT: 41.9 % (ref 39.0–52.0)
Hemoglobin: 13.7 g/dL (ref 13.0–17.0)
MCH: 30.7 pg (ref 26.0–34.0)
MCHC: 32.7 g/dL (ref 30.0–36.0)
MCV: 93.9 fL (ref 80.0–100.0)
Platelets: 247 10*3/uL (ref 150–400)
RBC: 4.46 MIL/uL (ref 4.22–5.81)
RDW: 13.7 % (ref 11.5–15.5)
WBC: 6.9 10*3/uL (ref 4.0–10.5)
nRBC: 0 % (ref 0.0–0.2)

## 2019-08-09 LAB — HEPATITIS PANEL, ACUTE
HCV Ab: REACTIVE — AB
Hep A IgM: NONREACTIVE
Hep B C IgM: NONREACTIVE
Hepatitis B Surface Ag: NONREACTIVE

## 2019-08-09 MED ORDER — AMOXICILLIN-POT CLAVULANATE 875-125 MG PO TABS
1.0000 | ORAL_TABLET | Freq: Two times a day (BID) | ORAL | Status: DC
  Administered 2019-08-09 – 2019-08-10 (×2): 1 via ORAL
  Filled 2019-08-09 (×2): qty 1

## 2019-08-09 MED ORDER — LIP MEDEX EX OINT
TOPICAL_OINTMENT | CUTANEOUS | Status: AC
  Filled 2019-08-09: qty 7

## 2019-08-09 NOTE — Progress Notes (Signed)
PROGRESS NOTE    Robert [name]  ZRA:[id number] DOB: 06-27-1980 DOA: 08/07/2019 PCP: Patient, No Pcp Per    Chief Complaint  Patient presents with  . Abscess    Brief Narrative:  39 year old white male prior hepatitis C, nicotine dependence, IVDU Admitted 08/07/2019 with 3-day history right hand and wrist pain-ongoing IVDU Patient started on vancomycin and Zosyn, bedside ultrasound revealed no evidence of fluid collection or abscess   Assessment & Plan:   Active Problems:   Intravenous drug abuse (HCC)   Cellulitis of hand, right   Nicotine dependence, cigarettes, uncomplicated   Right arm cellulitis   Cellulitis of right hand   1. Right wrist cellulitis secondary to IV drug use-ultrasound right upper extremity 08/08/2019 - for DVT a. Cellulitis is improved to some degree therefore I am changing over from Zosyn/vancomycin started on 6/12-->Augmentin 875 twice daily would treat for at least 10 days b. Repeat CBC and differential in a.m. 2. Polysubstance abuse-on admit--drug screen was positive for amphetamine cocaine and THC a. Patient is currently on OxyIR every 4 as needed moderate pain-no escalation medications b. We are attempting to get him to inpatient detox center-if this falls through patient will discharge and have to get treatment in the outpatient setting with resources as per social worker 3. Hepatitis C with transaminitis, HCV antibody >11.0 a. Has never been treated for this? b. Needs outpatient Rx with R CID or hepatology to consider Simponi or Harvoni 4. Tobacco abuse 5. BMI 33 6. Impaired glucose tolerance    DVT prophylaxis: Lovenox Code Status: Full Family Communication: Disposition:   Status is: Inpatient  Remains inpatient appropriate because:Ongoing active pain requiring inpatient pain management, Ongoing diagnostic testing needed not appropriate for outpatient work up and Unsafe d/c plan   Dispo: The patient is from: Home              Anticipated  d/c is to: other              Anticipated d/c date is: 1 day              Patient currently is not medically stable to d/c.    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  Examination:  General exam: Awake alert coherent no distress EOMI NCAT disheveled affect Respiratory system: Chest clear no added sound no rales no rhonchi Cardiovascular system: S1-S2 cannot appreciate murmur slightly tachycardic Gastrointestinal system: Abdomen soft no rebound no guarding. Central nervous system: Intact Extremities: Patient has track marks in addition to tattoos on upper extremities Skin: As above Psychiatry:  Anxious    Data Reviewed: I have personally reviewed following labs and imaging studies BUN/creatinine 9/0.7 Glucose 113 Platelet 247 White count 6.9  Radiology Studies: VAS [place] UPPER EXTREMITY VENOUS DUPLEX  Result Date: 08/08/2019 UPPER VENOUS STUDY  Indications: Pain, and Erythema Performing Technologist: Jeb Levering RDMS, RVT  Examination Guidelines: A complete evaluation includes B-mode imaging, spectral Doppler, color Doppler, and power Doppler as needed of  all accessible portions of each vessel. Bilateral testing is considered an integral part of a complete examination. Limited examinations for reoccurring indications may be performed as noted.  Right Findings: +----------+------------+---------+-----------+----------+-------+ RIGHT     CompressiblePhasicitySpontaneousPropertiesSummary +----------+------------+---------+-----------+----------+-------+ IJV           Full       Yes       Yes                      +----------+------------+---------+-----------+----------+-------+ Subclavian    Full       Yes       Yes                      +----------+------------+---------+-----------+----------+-------+ Axillary      Full       Yes       Yes                      +----------+------------+---------+-----------+----------+-------+ Brachial      Full                                          +----------+------------+---------+-----------+----------+-------+ Radial        Full                                          +----------+------------+---------+-----------+----------+-------+ Ulnar         Full                                          +----------+------------+---------+-----------+----------+-------+ Cephalic      Full                                          +----------+------------+---------+-----------+----------+-------+ Basilic       Full                                          +----------+------------+---------+-----------+----------+-------+  Summary:  Right: No evidence of deep vein thrombosis in the upper extremity. No evidence of superficial vein thrombosis in the upper extremity.  *See table(s) above for measurements and observations.  Diagnosing physician: Sherald Hess MD Electronically signed by Sherald Hess MD on 08/08/2019 at 5:42:46 PM.    Final       Scheduled Meds: . enoxaparin (LOVENOX) injection  40 mg Subcutaneous Q24H  . nicotine  7 mg Transdermal Daily  . pantoprazole   40 mg Oral Daily   Continuous Infusions: . sodium chloride 500 mL (08/08/19 1606)  . piperacillin-tazobactam (ZOSYN)  IV 3.375 g (08/08/19 2336)  .  vancomycin 1,500 mg (08/08/19 2022)     LOS: 1 day    Time spent: 5 minutes    Nita Sells, MD [facility]   To contact the attending provider between 7A-7P or the covering provider during after hours 7P-7A, please log into the web site [email or web address] and access using universal [place] password for that web site. If you do not have the password, please call the hospital operator.  08/09/2019, 8:14 AM

## 2019-08-09 NOTE — TOC Initial Note (Signed)
Transition of Care [facility]) - Initial/Assessment Note    Patient Details  Name: Robert [name] MRN: [id number] Date of Birth: 09/17/1980  Transition of Care [facility]) CM/SW Contact:    Nakyia Dau C Tarpley-Carter, [place] Phone Number: 08/09/2019, 12:15 PM  Clinical Narrative:                 Patient stated he was interested in residential treatment facility.  Patient was recently at detox treatment center at [facility].  Patient was suppose to discharge to [facility], but could not due to medical reasons.  Patient reports he was sober for 53 days while residing at the [facility].  Patient last time using was Friday, August 06, 2019.  Patient stated he used all day on Friday.  CSW discussed SA treatment options with the patient.  Patient notified CSW that he had previously reached out to [facility] and [facility] detox facilities and has not heard back from either facility.  Patient gave permission to CSW to reach out to both facilities and provide a referral.  CSW reached out to [facility] and [facility].  CSW faxed referral to [facility] intake, waiting for a response.  Contacted [facility], left a message with Shayla in admissions, and currently waiting for a response.  Patient updated at 12:27pm. Patient notified that he will not be held at hospital if/when medically stable patient will be responsible to follow up.  Expected Discharge Plan:  (Detox treatment) Barriers to Discharge: Continued Medical Work up, Active Substance Use - Placement   Patient Goals and CMS Choice Patient states their goals for this hospitalization and ongoing recovery are:: To gain residence at [facility] residential treatment facility.   Choice offered to / list presented to : NA  Expected Discharge Plan and Services Expected Discharge Plan:  (Detox treatment) In-house Referral: Clinical Social Work Discharge Planning Services: CM Consult Post Acute Care Choice:  (Detox Facility) Living arrangements for the past 2 months: Hotel/Motel                 DME  Arranged: N/A DME Agency: NA       HH Arranged: NA [place] Agency: NA        Prior Living Arrangements/Services Living arrangements for the past 2 months: Hotel/Motel Lives with:: Significant Other Patient language and need for interpreter reviewed:: No Do you feel safe going back to the place where you live?: Yes      Need for Family Participation in Patient Care: No (Comment) Care giver support system in place?: No (comment)   Criminal Activity/Legal Involvement Pertinent to Current Situation/Hospitalization: No - Comment as needed  Activities of Daily Living Home Assistive Devices/Equipment: None ADL Screening (condition at time of admission) Patient's cognitive ability adequate to safely complete daily activities?: Yes Is the patient deaf or have difficulty hearing?: No Does the patient have difficulty seeing, even when wearing glasses/contacts?: No Does the patient have difficulty concentrating, remembering, or making decisions?: No Patient able to express need for assistance with ADLs?: Yes Does the patient have difficulty dressing or bathing?: No Independently performs ADLs?: Yes (appropriate for developmental age) Does the patient have difficulty walking or climbing stairs?: No Weakness of Legs: None Weakness of Arms/Hands: None  Permission Sought/Granted Permission sought to share information with : Other (comment) (Residential Treatment Facilities) Permission granted to share information with : Yes, Release of Information Signed, Yes, Verbal Permission Granted     Permission granted to share info w AGENCY: [facility], [facility]  Emotional Assessment Appearance:: Well-Groomed, Developmentally appropriate, Appears stated age Attitude/Demeanor/Rapport: Engaged Affect (typically observed): Accepting, Calm, Hopeful Orientation: : Oriented to Self, Oriented to Place, Oriented to  Time, Oriented to Situation Alcohol / Substance Use: Illicit Drugs Psych Involvement: No  (comment)  Admission diagnosis:  Cellulitis of right hand [L03.113] Right arm cellulitis [L03.113] Patient Active Problem List   Diagnosis Date Noted  . Cellulitis of right hand 08/08/2019  . Cellulitis of hand, right 08/07/2019  . Chronic hepatitis C (HCC) 08/07/2019  . Nicotine dependence, cigarettes, uncomplicated 08/07/2019  . Right arm cellulitis 08/07/2019  . Cellulitis of leg, right   . Cellulitis and abscess of upper extremity 02/28/2018  . Cocaine use disorder, moderate, dependence (HCC) 07/07/2017  . 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 Substance induced mood disorder (HCC) 03/08/2017  . Alcoholism (HCC) 09/12/2014  . Intravenous drug abuse (HCC) 09/12/2014   PCP:  Patient, No Pcp Per Pharmacy:   [facility] [address], [place] - [address] [place] [address] Phone: [phone] Fax: [phone]  [name] [place] [place] - [address] [place] [address] Phone: [phone] Fax: [phone]     Social Determinants of Health (SDOH) Interventions    Readmission Risk Interventions No flowsheet data found.

## 2019-08-09 NOTE — Progress Notes (Signed)
TOC staff actively searching for SA treatment facility.  Patient signed consent form.

## 2019-08-09 NOTE — TOC Progression Note (Signed)
Transition of Care [facility]) - Progression Note    Patient Details  Name: Robert [name] MRN: [id number] Date of Birth: 1980-12-30  Transition of Care [facility]) CM/SW Contact  Clearance Coots, LCSW Phone Number: 08/09/2019, 4:18 PM  Clinical Narrative:    Patient has been accepted to [facility] facility. Patient has to admit at 5:00pm tomorrow. CSW notified physician and the patient. Patient will need to purchase medications prior to admitting.  [facility] to transport.    Expected Discharge Plan:  (Detox treatment) Barriers to Discharge: Continued Medical Work up, Active Substance Use - Placement  Expected Discharge Plan and Services Expected Discharge Plan:  (Detox treatment) In-house Referral: Clinical Social Work Discharge Planning Services: CM Consult Post Acute Care Choice:  (Detox Facility) Living arrangements for the past 2 months: Hotel/Motel                 DME Arranged: N/A DME Agency: NA       HH Arranged: NA HH Agency: NA         Social Determinants of Health (SDOH) Interventions    Readmission Risk Interventions No flowsheet data found.

## 2019-08-10 LAB — CBC WITH DIFFERENTIAL/PLATELET
Abs Immature Granulocytes: 0.02 10*3/uL (ref 0.00–0.07)
Basophils Absolute: 0.1 10*3/uL (ref 0.0–0.1)
Basophils Relative: 1 %
Eosinophils Absolute: 0.2 10*3/uL (ref 0.0–0.5)
Eosinophils Relative: 3 %
HCT: 41.8 % (ref 39.0–52.0)
Hemoglobin: 13.8 g/dL (ref 13.0–17.0)
Immature Granulocytes: 0 %
Lymphocytes Relative: 44 %
Lymphs Abs: 3.1 10*3/uL (ref 0.7–4.0)
MCH: 30.9 pg (ref 26.0–34.0)
MCHC: 33 g/dL (ref 30.0–36.0)
MCV: 93.5 fL (ref 80.0–100.0)
Monocytes Absolute: 0.6 10*3/uL (ref 0.1–1.0)
Monocytes Relative: 8 %
Neutro Abs: 3.1 10*3/uL (ref 1.7–7.7)
Neutrophils Relative %: 44 %
Platelets: 283 10*3/uL (ref 150–400)
RBC: 4.47 MIL/uL (ref 4.22–5.81)
RDW: 13.8 % (ref 11.5–15.5)
WBC: 7.1 10*3/uL (ref 4.0–10.5)
nRBC: 0 % (ref 0.0–0.2)

## 2019-08-10 LAB — BASIC METABOLIC PANEL
Anion gap: 9 (ref 5–15)
BUN: 9 mg/dL (ref 6–20)
CO2: 26 mmol/L (ref 22–32)
Calcium: 9.2 mg/dL (ref 8.9–10.3)
Chloride: 107 mmol/L (ref 98–111)
Creatinine, Ser: 0.66 mg/dL (ref 0.61–1.24)
GFR calc Af Amer: >60 mL/min (ref >60)
GFR calc non Af Amer: >60 mL/min (ref >60)
Glucose, Bld: 108 mg/dL — ABNORMAL HIGH (ref 70–99)
Potassium: 3.5 mmol/L (ref 3.5–5.1)
Sodium: 142 mmol/L (ref 135–145)

## 2019-08-10 MED ORDER — OXYCODONE HCL 5 MG PO TABS: 5.0000 mg | ORAL_TABLET | ORAL | 0 refills | Status: DC | PRN

## 2019-08-10 MED ORDER — HYDROXYZINE HCL 25 MG PO TABS: 25.0000 mg | ORAL_TABLET | Freq: Four times a day (QID) | ORAL | 0 refills | Status: DC | PRN

## 2019-08-10 MED ORDER — PANTOPRAZOLE SODIUM 40 MG PO TBEC: 40.0000 mg | DELAYED_RELEASE_TABLET | Freq: Every day | ORAL | Status: DC

## 2019-08-10 MED ORDER — AMOXICILLIN-POT CLAVULANATE 875-125 MG PO TABS: 1.0000 | ORAL_TABLET | Freq: Two times a day (BID) | ORAL | 0 refills | Status: DC

## 2019-08-10 MED ORDER — PANTOPRAZOLE SODIUM 40 MG PO TBEC: 40.0000 mg | DELAYED_RELEASE_TABLET | Freq: Every day | ORAL | 0 refills | Status: DC

## 2019-08-10 MED FILL — hydrOXYzine HCL 25 MG TABS: 25 | 7 days supply | Qty: 30 | Fill #0

## 2019-08-10 MED FILL — AMOX-CLAV 875-125 MG TABLET: 875-125 | 7 days supply | Qty: 14 | Fill #0

## 2019-08-10 MED FILL — PANTOPRAZOLE SOD DR 40 MG T: 40 | 30 days supply | Qty: 30 | Fill #0

## 2019-08-10 NOTE — Plan of Care (Signed)
Patient was given his filled prescriptions of hydroxyine, Augmentin, and pantoprazole. Instructions were given to patient about each medication;how and when to take them. All questions were answered.Patient was also given written instructions and discharge paperwork. Patient was taken to his facility via ambulance.
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
## 2019-08-10 NOTE — Discharge Summary (Signed)
Physician Discharge Summary  Robert SinclairJoseph [name] ZOX:[id number] DOB: 02/01/1981 DOA: 08/07/2019  PCP: Patient, No Pcp Per  Admit date: 08/07/2019 Discharge date: 08/10/2019  Time spent: 55 minutes  Recommendations for Outpatient Follow-up:  1. Needs outpatient A1c 2. Needs outpatient hepatology/infectious disease follow-up given possible hepatitis C 3. Would recommend CBC and Chem-12 in about 2 to 3 weeks as an outpatient 4. Continue to assist patient with polysubstance abuse cessation  Discharge Diagnoses:  Active Problems:   Intravenous drug abuse (HCC)   Cellulitis of hand, right   Nicotine dependence, cigarettes, uncomplicated   Right arm cellulitis   Cellulitis of right hand   Discharge Condition: Improved  Diet recommendation: Heart healthy  Filed Weights   08/09/19 1100  Weight: 79.2 kg    History of present illness:  39 year old white male prior hepatitis C, nicotine dependence, IVDU Admitted 08/07/2019 with 3-day history right hand and wrist pain-ongoing IVDU Patient started on vancomycin and Zosyn, bedside ultrasound revealed no evidence of fluid collection or abscess  Hospital Course:  1. Right wrist cellulitis secondary to IV drug use-ultrasound right upper extremity 08/08/2019 - for DVT a. Cellulitis is improved changing over from Zosyn/vancomycin started on 6/12-->Augmentin 875 twice daily b. White count never elevated 2. Polysubstance abuse-on admit--drug screen was positive for amphetamine cocaine and THC a. Patient is currently on OxyIR every 4 as needed moderate pain-no escalation medications b. We are attempting to get him to inpatient detox center-and he will be able to discharge to this facility for 90-day program on 6/15 3. Hepatitis C with transaminitis, HCV antibody >11.0 a. Has never been treated for this? b. Needs outpatient Rx with R CID or hepatology to consider Simponi or Harvoni and this should be set up in the outpatient by PCP 4. Tobacco  abuse 1. Encouraged to quit 5. BMI 33 6. Impaired glucose tolerance 1. We will need an A1c in the outpatient setting  Procedures:  None  Consultations:  None  Discharge Exam: Vitals:   08/09/19 2132 08/10/19 0546  BP: 125/73 110/62  Pulse: 67 (!) 55  Resp: 16 16  Temp: 97.7 F (36.5 C) (!) 97.4 F (36.3 C)  SpO2: 100% 99%    General: Awake alert coherent seems somewhat irritated this morning but otherwise just fine Cardiovascular: S1-S2 no murmur rub or gallop Respiratory: Chest is clear no added sound Right arm looks well and is less red warm and tender  Discharge Instructions   Discharge Instructions    Diet - low sodium heart healthy   Complete by: As directed    Increase activity slowly   Complete by: As directed      Allergies as of 08/10/2019      Reactions   Hydrocodone    nausea      Medication List    STOP taking these medications   DULoxetine 30 MG capsule Commonly known as: CYMBALTA   ibuprofen 200 MG tablet Commonly known as: ADVIL     TAKE these medications   amoxicillin-clavulanate 875-125 MG tablet Commonly known as: AUGMENTIN Take 1 tablet by mouth every 12 (twelve) hours.   hydrOXYzine 25 MG tablet Commonly known as: ATARAX/VISTARIL Take 1 tablet (25 mg total) by mouth every 6 (six) hours as needed for anxiety.   nicotine 21 mg/24hr patch Commonly known as: NICODERM CQ - dosed in mg/24 hours Place 21 mg onto the skin daily.   oxyCODONE 5 MG immediate release tablet Commonly known as: Oxy IR/ROXICODONE Take 1 tablet (5 mg total) by  mouth every 4 (four) hours as needed for moderate pain or severe pain.   pantoprazole 40 MG tablet Commonly known as: PROTONIX Take 1 tablet (40 mg total) by mouth daily. Start taking on: August 11, 2019      Allergies  Allergen Reactions  . Hydrocodone     nausea      The results of significant diagnostics from this hospitalization (including imaging, microbiology, ancillary and  laboratory) are listed below for reference.    Significant Diagnostic Studies: DG Wrist Complete Right  Result Date: 08/07/2019 CLINICAL DATA:  Wrist pain. EXAM: RIGHT WRIST - COMPLETE 3+ VIEW COMPARISON:  None. FINDINGS: There is no evidence of fracture or dislocation. There is no evidence of arthropathy or other focal bone abnormality. There is extensive soft tissue swelling about the hand. There is an old healed fifth metacarpal fracture. IMPRESSION: Extensive soft tissue swelling about the hand without underlying acute osseous abnormality. Electronically Signed   By: Katherine Mantle M.D.   On: 08/07/2019 01:52   MR WRIST RIGHT W WO CONTRAST  Result Date: 08/07/2019 CLINICAL DATA:  IV drug user. Right hand and wrist pain. EXAM: MR OF THE RIGHT WRIST WITHOUT AND WITH CONTRAST TECHNIQUE: Multiplanar multisequence MR imaging of the right wrist was performed both before and after the administration of intravenous contrast. CONTRAST:  98mL GADAVIST GADOBUTROL 1 MMOL/ML IV SOLN COMPARISON:  None. FINDINGS: Extremely limited examination secondary to patient motion degrading image quality. Ligaments: Intact scapholunate and lunotriquetral ligaments. Triangular fibrocartilage: Intact TFCC. Tendons: Intact flexor compartment tendons. Mild tendinosis of the extensor carpi ulnaris tendon. Remainder the extensor compartment tendons are intact. Small amount of fluid in the extensor carpi ulnaris, extensor digitorum, and extensor carpi radialis brevis and longus tendon sheaths likely reflecting mild tenosynovitis which may be secondary to infectious etiology or reactive etiology given the adjacent severe cellulitis. Carpal tunnel/median nerve: Normal carpal tunnel. Normal median nerve. Guyon's canal: Normal. Joint/cartilage: No joint effusion. No chondral defect. No synovitis. Bones/carpal alignment: No aggressive osseous lesion. No periosteal reaction or bone destruction. Normal alignment. Other: Severe soft tissue  edema and enhancement along the dorsal aspect of the wrist and extending into the hand most concerning for severe cellulitis. No drainable fluid collection to suggest an abscess. IMPRESSION: 1. Severe soft tissue edema and enhancement along the dorsal aspect of the wrist and extending into the hand most concerning for severe cellulitis. No drainable fluid collection to suggest an abscess. 2. Small amount of fluid in the extensor carpi ulnaris, extensor digitorum, and extensor carpi radialis brevis and longus tendon sheaths likely reflecting mild tenosynovitis which may be secondary to infectious etiology or reactive etiology given the adjacent severe cellulitis. Electronically Signed   By: Elige Ko   On: 08/07/2019 08:01   VAS [place] UPPER EXTREMITY VENOUS DUPLEX  Result Date: 08/08/2019 UPPER VENOUS STUDY  Indications: Pain, and Erythema Performing Technologist: Jeb Levering RDMS, RV[facility]  Examination Guidelines: A complete evaluation includes B-mode imaging, spectral Doppler, color Doppler, and power Doppler as needed of all accessible portions of each vessel. Bilateral testing is considered an integral part of a complete examination. Limited examinations for reoccurring indications may be performed as noted.  Right Findings: +----------+------------+---------+-----------+----------+-------+ RIGHT     CompressiblePhasicitySpontaneousPropertiesSummary +----------+------------+---------+-----------+----------+-------+ IJV           Full       Yes       Yes                      +----------+------------+---------+-----------+----------+-------+  Subclavian    Full       Yes       Yes                      +----------+------------+---------+-----------+----------+-------+ Axillary      Full       Yes       Yes                      +----------+------------+---------+-----------+----------+-------+ Brachial      Full                                           +----------+------------+---------+-----------+----------+-------+ Radial        Full                                          +----------+------------+---------+-----------+----------+-------+ Ulnar         Full                                          +----------+------------+---------+-----------+----------+-------+ Cephalic      Full                                          +----------+------------+---------+-----------+----------+-------+ Basilic       Full                                          +----------+------------+---------+-----------+----------+-------+  Summary:  Right: No evidence of deep vein thrombosis in the upper extremity. No evidence of superficial vein thrombosis in the upper extremity.  *See table(s) above for measurements and observations.  Diagnosing physician: Sherald Hess MD Electronically signed by Sherald Hess MD on 08/08/2019 at 5:42:46 PM.    Final     Microbiology: Recent Results (from the past 240 hour(s))  SARS Coronavirus 2 by RT PCR (hospital order, performed in [facility] hospital lab) Nasopharyngeal Nasopharyngeal Swab     Status: None   Collection Time: 08/07/19  2:27 AM   Specimen: Nasopharyngeal Swab  Result Value Ref Range Status   SARS Coronavirus 2 NEGATIVE NEGATIVE Final    Comment: (NOTE) SARS-CoV-2 target nucleic acids are NOT DETECTED.  The SARS-CoV-2 RNA is generally detectable in upper and lower respiratory specimens during the acute phase of infection. The lowest concentration of SARS-CoV-2 viral copies this assay can detect is 250 copies / mL. A negative result does not preclude SARS-CoV-2 infection and should not be used as the sole basis for treatment or other patient management decisions.  A negative result may occur with improper specimen collection / handling, submission of specimen other than nasopharyngeal swab, presence of viral mutation(s) within the areas targeted by this assay, and inadequate  number of viral copies (<250 copies / mL). A negative result must be combined with clinical observations, patient history, and epidemiological information.  Fact Sheet for Patients:   BoilerBrush.com.cy  Fact Sheet for Healthcare Providers: https://pope.com/  This test is not yet approved  or  cleared by the [place] and has been authorized for detection and/or diagnosis of SARS-CoV-2 by FDA under an Emergency Use Authorization (EUA).  This EUA will remain in effect (meaning this test can be used) for the duration of the COVID-19 declaration under Section 564(b)(1) of the Act, 21 U.S.C. section 360bbb-3(b)(1), unless the authorization is terminated or revoked sooner.  Performed at [facility], [address], [place], [place] [address]   Blood culture (routine x 2)     Status: None (Preliminary result)   Collection Time: 08/07/19  2:30 AM   Specimen: BLOOD RIGHT FOREARM  Result Value Ref Range Status   Specimen Description   Final    BLOOD RIGHT FOREARM Performed at [facility], [address], [place], [place] [address]    Special Requests   Final    BOTTLES DRAWN AEROBIC AND ANAEROBIC Blood Culture results may not be optimal due to an excessive volume of blood received in culture bottles Performed at [facility], [address] [place], [place] [address]    Culture   Final    NO GROWTH 3 DAYS Performed at [facility], [address] [place], [place] [address]    Report Status PENDING  Incomplete  Blood culture (routine x 2)     Status: None (Preliminary result)   Collection Time: 08/07/19  2:31 AM   Specimen: BLOOD LEFT FOREARM  Result Value Ref Range Status   Specimen Description   Final    BLOOD LEFT FOREARM Performed at [facility], [address] [place], [place] [address]    Special Requests   Final    BOTTLES DRAWN  AEROBIC AND ANAEROBIC Blood Culture adequate volume Performed at [facility], [address] [place], [place] [address]    Culture   Final    NO GROWTH 3 DAYS Performed at [facility], [address] [place], [place] [address]    Report Status PENDING  Incomplete     Labs: Basic Metabolic Panel: Recent Labs  Lab 08/07/19 0226 08/08/19 0441 08/09/19 0459 08/10/19 0458  NA 135 138 140 142  K 3.9 4.0 3.5 3.5  CL 96* 105 105 107  CO2 29 23 26 26   GLUCOSE 100* 109* 113* 108*  BUN 10 11 9 9   CREATININE 0.75 0.71 0.70 0.66  CALCIUM 9.1 8.9 9.0 9.2   Liver Function Tests: Recent Labs  Lab 08/07/19 0226 08/08/19 0441 08/09/19 0459  AST 197* 124* 88*  ALT 358* 244* 203*  ALKPHOS 109 91 86  BILITOT 0.8 0.7 0.6  PROT 8.0 6.6 6.9  ALBUMIN 4.0 3.2* 3.3*   No results for input(s): LIPASE, AMYLASE in the last 168 hours. No results for input(s): AMMONIA in the last 168 hours. CBC: Recent Labs  Lab 08/07/19 0226 08/08/19 0441 08/09/19 0459 08/10/19 0458  WBC 7.7 5.9 6.9 7.1  NEUTROABS 4.5 2.2  --  3.1  HGB 14.2 13.2 13.7 13.8  HCT 42.4 40.6 41.9 41.8  MCV 92.6 94.0 93.9 93.5  PLT 252 240 247 283   Cardiac Enzymes: No results for input(s): CKTOTAL, CKMB, CKMBINDEX, TROPONINI in the last 168 hours. BNP: BNP (last 3 results) No results for input(s): BNP in the last 8760 hours.  ProBNP (last 3 results) No results for input(s): PROBNP in the last 8760 hours.  CBG: No results for input(s): GLUCAP in the last 168 hours.  Signed:  Nita Sells MD   [facility] 08/10/2019, 11:41 AM

## 2019-08-10 NOTE — Progress Notes (Signed)
TOC CM faxed MATCH letter to [facility]. Pt states he does not want Nicotine patch. Pt understands he can utilize MATCH once per year. He used in 09/2018, override completed. Pt dc to detox program and will need medication prior to dc to program. Isidoro Donning RN CCM, [facility] ED TOC CM [phone]

## 2019-08-10 NOTE — Progress Notes (Signed)
  MATCH Medication Assistance Card  Name: Quintell Bonnin  ID (MRN): [id number]  Bin: [id number] RX Group: [id number]  Discharge Date: 08/10/2019  Expiration Date: 08/18/2019  (must be filled within 7 days of discharge)     Dear [name]:  You have been approved to have the prescriptions written by your discharging physician filled through our [facility] (Medication Assistance Through [facility]) program. This program allows for a one-time (no refills) 34-day supply of selected medications for a low copay amount.  The copay is $3.00 per prescription. For instance, if you have one prescription, you will pay $3.00; for two prescriptions, you pay $6.00; for three prescriptions, you pay $9.00; and so on.  Only certain pharmacies are participating in this program with [facility]. You will need to select one of the pharmacies from the attached list and take your prescriptions, this letter, and your photo ID to one of the participating pharmacies.   We are excited that you are able to use the [facility] program to get your medications. These prescriptions must be filled within 7 days of hospital discharge or they will no longer be valid for the [facility] program. Should you have any problems with your prescriptions please contact your case management team member at [phone] or [phone] for [facility].   Thank you,    [facility]

## 2019-08-12 LAB — CULTURE, BLOOD (ROUTINE X 2)
Culture: NO GROWTH
Culture: NO GROWTH
Special Requests: ADEQUATE

## 2019-09-07 ENCOUNTER — Other Ambulatory Visit: Payer: Self-pay

## 2019-09-07 ENCOUNTER — Ambulatory Visit (INDEPENDENT_AMBULATORY_CARE_PROVIDER_SITE_OTHER): Payer: No Payment, Other | Admitting: Licensed Clinical Social Worker

## 2019-09-07 DIAGNOSIS — F332 Major depressive disorder, recurrent severe without psychotic features: Secondary | ICD-10-CM | POA: Diagnosis not present

## 2019-09-09 NOTE — Progress Notes (Signed)
Comprehensive Clinical Assessment (CCA) Note  09/09/2019 Robert [name] [id number]  Visit Diagnosis:      ICD-10-CM   1. Severe episode of recurrent major depressive disorder, without psychotic features (HCC)  F33.2     CCA Biopsychosocial Intake/Chief Complaint:  CCA Intake With Chief Complaint CCA Part Two Date: 09/07/19 CCA Part Two Time: 0900 Chief Complaint/Presenting Problem: Depression, Addiction Problem Patient's Currently Reported Symptoms/Problems: Lack of motivation, no interest in anything, numbness, sadness, emptiness, losses r/t addiction Individual's Preferences: viutual sessions, call him "Joey" Type of Services Patient Feels Are Needed: Counseling, med management Initial Clinical Notes/Concerns: LCSW reviewed informed consent for counseling with pt verbal acknowledgement. Pt currently living in [facility] after d/c from a detox stay at [facility]. Pt states he is tired of all the losses he incurs from addiction. Loss of home, business, poor relationships. Pt has long hx of cycles of addiction. Currently clean for 30 days. He is attending NA several times a wk virtually. Pt reports a hx of working in [employer] and expects to be able to return to work with a past employer in the next couple of wks. Pt struggling with how depressed he feels. He states "I've never been this bad". Denies thoughts of self harm or harm to others.  Mental Health Symptoms Depression:  Depression: Change in energy/activity, Difficulty Concentrating  Mania:  Mania: None  Anxiety:   Anxiety: Worrying  Psychosis:  Psychosis: None  Trauma:  Trauma: None  Obsessions:  Obsessions: None  Compulsions:  Compulsions:  (Cycles of compulsive drug use, currently clean)  Inattention:  Inattention:  (Poor concentration)  Hyperactivity/Impulsivity:     Oppositional/Defiant Behaviors:  Oppositional/Defiant Behaviors: None  Emotional Irregularity:  Emotional Irregularity: Mood  lability, Unstable self-image, Chronic feelings of emptiness  Other Mood/Personality Symptoms:      Mental Status Exam Appearance and self-care  Stature:  Stature: Average  Weight:  Weight: Average weight  Clothing:  Clothing: Casual  Grooming:  Grooming: Normal  Cosmetic use:  Cosmetic Use: None  Posture/gait:  Posture/Gait: Tense  Motor activity:  Motor Activity: Restless  Sensorium  Attention:  Attention: Distractible  Concentration:  Concentration:  (poor)  Orientation:  Orientation: X5  Recall/memory:  Recall/Memory: Normal  Affect and Mood  Affect:  Affect: Depressed  Mood:  Mood: Negative  Relating  Eye contact:  Eye Contact: Avoided  Facial expression:  Facial Expression: Tense, Responsive  Attitude toward examiner:  Attitude Toward Examiner: Cooperative  Thought and Language  Speech flow: Speech Flow: Normal  Thought content:  Thought Content: Appropriate to Mood and Circumstances  Preoccupation:     Hallucinations:  Hallucinations: None  Organization:     Company secretary of Knowledge:  Fund of Knowledge: Average  Intelligence:  Intelligence: Average  Abstraction:  Abstraction: Normal  Judgement:  Judgement:  (Currently good)  Reality Testing:  Reality Testing: Adequate  Insight:  Insight: Present  Decision Making:  Decision Making: Vacilates  Social Functioning  Social Maturity:     Social Judgement:  Social Judgement: "Chief of Staff"  Stress  Stressors:  Stressors: Surveyor, quantity, Publishing copy Ability:  Coping Ability: Overwhelmed, Horticulturist, commercial Deficits:  Skill Deficits: Self-control  Supports:  Supports: Friends/Service system, Family    Exercise/Diet: Exercise/Diet Do You Have Any Trouble Sleeping?: Yes  CCA Employment/Education Employment/Work Situation: 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 Has patient ever been in the [employer]?: No  Education: Education Last Grade Completed:  9 Did You  Graduate From [employer]?: No Did You Attend College?: No Did You Attend Graduate School?: No  CCA Family/Childhood History Family and Relationship History: Family history Marital status: Single What is your sexual orientation?: Heterosexual Does patient have children?: No  Childhood History:  Childhood History By whom was/is the patient raised?: Mother/father and step-parent, Grandparents Description of patient's relationship with caregiver when they were a child: "Good" Patient's description of current relationship with people who raised him/her: Grandfather still living - "good", mom - "good" Does patient have siblings?: No Did patient suffer any verbal/emotional/physical/sexual abuse as a child?: No Did patient suffer from severe childhood neglect?: No Has patient ever been sexually abused/assaulted/raped as an adolescent or adult?: No Was the patient ever a victim of a crime or a disaster?: No Witnessed domestic violence?: No Has patient been affected by domestic violence as an adult?: No  CCA Substance Use Alcohol/Drug Use: Alcohol / Drug Use History of alcohol / drug use?: Yes (Pt currently clean for 30 days after inpt stay. Living in [facility]. States he was using cocaine, meth and heroin. States he has used minimal alcohol for the 2 yrs.)   DSM5 Diagnoses: Patient Active Problem List   Diagnosis Date Noted  . Cellulitis of right hand 08/08/2019  . Cellulitis of hand, right 08/07/2019  . Chronic hepatitis C (HCC) 08/07/2019  . Nicotine dependence, cigarettes, uncomplicated 08/07/2019  . Right arm cellulitis 08/07/2019  . Cellulitis of leg, right   . Cellulitis and abscess of upper extremity 02/28/2018  . Cocaine use disorder, moderate, dependence (HCC) 07/07/2017  . 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 Substance induced mood  disorder (HCC) 03/08/2017  . Alcoholism (HCC) 09/12/2014  . Intravenous drug abuse (HCC) 09/12/2014    [place] Sink, MSW, LCSW

## 2019-09-15 ENCOUNTER — Encounter (HOSPITAL_COMMUNITY): Payer: Self-pay | Admitting: Emergency Medicine

## 2019-09-15 ENCOUNTER — Other Ambulatory Visit: Payer: Self-pay

## 2019-09-15 ENCOUNTER — Emergency Department (HOSPITAL_COMMUNITY)
Admission: EM | Admit: 2019-09-15 | Discharge: 2019-09-16 | Disposition: A | Discharge status: Home / Self Care | Payer: Self-pay | Attending: Emergency Medicine | Admitting: Emergency Medicine

## 2019-09-15 DIAGNOSIS — S0181XA Laceration without foreign body of other part of head, initial encounter: Secondary | ICD-10-CM | POA: Insufficient documentation

## 2019-09-15 DIAGNOSIS — Y929 Unspecified place or not applicable: Secondary | ICD-10-CM | POA: Insufficient documentation

## 2019-09-15 DIAGNOSIS — W19XXXA Unspecified fall, initial encounter: Secondary | ICD-10-CM

## 2019-09-15 DIAGNOSIS — W1830XA Fall on same level, unspecified, initial encounter: Secondary | ICD-10-CM | POA: Insufficient documentation

## 2019-09-15 DIAGNOSIS — F17210 Nicotine dependence, cigarettes, uncomplicated: Secondary | ICD-10-CM | POA: Insufficient documentation

## 2019-09-15 DIAGNOSIS — Y999 Unspecified external cause status: Secondary | ICD-10-CM | POA: Insufficient documentation

## 2019-09-15 DIAGNOSIS — Y939 Activity, unspecified: Secondary | ICD-10-CM | POA: Insufficient documentation

## 2019-09-15 DIAGNOSIS — Z23 Encounter for immunization: Secondary | ICD-10-CM | POA: Insufficient documentation

## 2019-09-15 MED ORDER — DOXYCYCLINE HYCLATE 100 MG PO CAPS
100.0000 mg | ORAL_CAPSULE | Freq: Two times a day (BID) | ORAL | 0 refills | Status: DC
Start: 2019-09-15 — End: 2019-09-22

## 2019-09-15 MED ORDER — DOXYCYCLINE HYCLATE 100 MG PO TABS
100.0000 mg | ORAL_TABLET | Freq: Once | ORAL | Status: AC
  Administered 2019-09-16: 100 mg via ORAL
  Filled 2019-09-15: qty 1

## 2019-09-15 MED ORDER — TETANUS-DIPHTH-ACELL PERTUSSIS 5-2.5-18.5 LF-MCG/0.5 IM SUSP
0.5000 mL | Freq: Once | INTRAMUSCULAR | Status: AC
  Administered 2019-09-15: 0.5 mL via INTRAMUSCULAR
  Filled 2019-09-15: qty 0.5

## 2019-09-15 NOTE — ED Triage Notes (Signed)
Per EMS, patient homeless, head laceration to right forehead after falling into creek. Reports hitting head on rock. Denies LOC. Reports ETOH and smoked Fentanyl after fall.

## 2019-09-15 NOTE — ED Provider Notes (Addendum)
[facility]   CSN: [id number] Arrival date & time: 09/15/19  2244     History Chief Complaint  Patient presents with  . Head Injury    Robert [name] is a 39 y.o. male.  The history is provided by the patient.  Head Injury Head/neck injury location: right forehead  Mechanism of injury comment:  Fall Pain details:    Quality:  Aching   Severity:  Mild   Timing:  Constant   Progression:  Unchanged Chronicity:  New Relieved by:  Nothing Worsened by:  Nothing Ineffective treatments:  None tried Associated symptoms: no blurred vision, no difficulty breathing, no disorientation, no double vision, no focal weakness, no headaches, no hearing loss, no loss of consciousness, no memory loss, no nausea, no neck pain, no numbness, no seizures, no tinnitus and no vomiting   Risk factors: alcohol intake   Hit head when fell in a creek.  No LOC.  No n/v/d.       Past Medical History:  Diagnosis Date  . Anxiety   . Arthritis   . Cellulitis 09/2018  . Chronic hepatitis C (HCC) 08/07/2019  . Depression   . Hepatitis    via pts mother  . 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 Nicotine dependence, cigarettes, uncomplicated 08/07/2019    Patient Active Problem List   Diagnosis Date Noted  . Cellulitis of right hand 08/08/2019  . Cellulitis of hand, right 08/07/2019  . Chronic hepatitis C (HCC) 08/07/2019  . Nicotine dependence, cigarettes, uncomplicated 08/07/2019  . Right arm cellulitis 08/07/2019  . Cellulitis of leg, right   . Cellulitis and abscess of upper extremity 02/28/2018  . Cocaine use disorder, moderate, dependence (HCC) 07/07/2017  . 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 Substance induced mood disorder (HCC) 03/08/2017  . Alcoholism (HCC)  09/12/2014  . Intravenous drug abuse (HCC) 09/12/2014    Past Surgical History:  Procedure Laterality Date  . GALLBLADDER SURGERY         Family History  Problem Relation Age of Onset  . Diabetes Mother   . Diabetes Maternal Grandfather     Social History   Tobacco Use  . Smoking status: Current Every Day Smoker    Packs/day: 1.00    Types: Cigarettes  . Smokeless tobacco: Never Used  Vaping Use  . Vaping Use: Some days  Substance Use Topics  . Alcohol use: Yes    Alcohol/week: 0.0 standard drinks  . Drug use: Yes    Types: Cocaine, Heroin, Methamphetamines    Home Medications Prior to Admission medications   Medication Sig Start Date End Date Taking? Authorizing Provider  amoxicillin-clavulanate (AUGMENTIN) 875-125 MG tablet Take 1 tablet by mouth every 12 (twelve) hours. 08/10/19   Rhetta Mura, MD  hydrOXYzine (ATARAX/VISTARIL) 25 MG tablet Take 1 tablet (25 mg total) by mouth every 6 (six) hours as needed for anxiety. 08/10/19   Rhetta Mura, MD  nicotine (NICODERM CQ - DOSED IN MG/24 HOURS) 21 mg/24hr patch Place 21 mg onto the skin daily.    [provider]  oxyCODONE (OXY IR/ROXICODONE) 5 MG immediate release tablet Take 1 tablet (5 mg total) by mouth every 4 (four) hours as needed for moderate pain or severe pain. 08/10/19   Rhetta Mura, MD  pantoprazole (PROTONIX) 40 MG tablet Take 1 tablet (40 mg total) by  mouth daily. 08/11/19   Rhetta Mura, MD    Allergies    Hydrocodone  Review of Systems   Review of Systems  Constitutional: Negative for fever.  HENT: Negative for hearing loss and tinnitus.   Eyes: Negative for blurred vision, double vision and visual disturbance.  Respiratory: Negative for shortness of breath.   Cardiovascular: Negative for chest pain.  Gastrointestinal: Negative for abdominal pain, nausea and vomiting.  Genitourinary: Negative for difficulty urinating.  Musculoskeletal: Negative for neck  pain.  Skin: Positive for wound.  Neurological: Negative for focal weakness, seizures, loss of consciousness, numbness and headaches.  Psychiatric/Behavioral: Negative for agitation and memory loss.  All other systems reviewed and are negative.   Physical Exam Updated Vital Signs BP (!) 136/110   Pulse 98   Temp 98.6 F (37 C) (Oral)   Resp 19   SpO2 99%   Physical Exam Vitals and nursing note reviewed.  Constitutional:      General: He is not in acute distress.    Appearance: Normal appearance.  HENT:     Head: Normocephalic.      Nose: Nose normal.     Mouth/Throat:     Mouth: Mucous membranes are moist.     Pharynx: Oropharynx is clear.  Eyes:     Conjunctiva/sclera: Conjunctivae normal.     Pupils: Pupils are equal, round, and reactive to light.  Cardiovascular:     Rate and Rhythm: Normal rate and regular rhythm.     Pulses: Normal pulses.     Heart sounds: Normal heart sounds.  Pulmonary:     Effort: Pulmonary effort is normal.     Breath sounds: Normal breath sounds.  Abdominal:     General: Abdomen is flat. Bowel sounds are normal.     Tenderness: There is no abdominal tenderness. There is no guarding or rebound.  Musculoskeletal:        General: No tenderness or deformity. Normal range of motion.     Cervical back: Normal range of motion and neck supple. No tenderness.  Skin:    General: Skin is warm and dry.     Capillary Refill: Capillary refill takes less than 2 seconds.  Neurological:     General: No focal deficit present.     Mental Status: He is alert and oriented to person, place, and time.     Deep Tendon Reflexes: Reflexes normal.  Psychiatric:        Mood and Affect: Mood normal.        Behavior: Behavior normal.     ED Results / Procedures / Treatments   Labs (all labs ordered are listed, but only abnormal results are displayed) Labs Reviewed - No data to display  EKG None  Radiology No results found.  Procedures .Marland KitchenLaceration  Repair  Date/Time: 09/15/2019 11:37 PM Performed by: Cy Blamer, MD Authorized by: Cy Blamer, MD   Consent:    Consent obtained:  Verbal   Consent given by:  Patient   Risks discussed:  Infection, need for additional repair, nerve damage, pain, poor cosmetic result, poor wound healing, retained foreign body, tendon damage and vascular damage   Alternatives discussed:  No treatment Anesthesia (see MAR for exact dosages):    Anesthesia method:  None Laceration details:    Location: right forehead    Length (cm):  2   Depth (mm):  1 Repair type:    Repair type:  Simple Pre-procedure details:    Preparation:  Patient was prepped and  draped in usual sterile fashion Exploration:    Hemostasis achieved with:  Direct pressure   Wound exploration: wound explored through full range of motion     Wound extent: no areolar tissue violation noted     Contaminated: no   Treatment:    Area cleansed with:  Betadine (chlorhexidine)   Irrigation solution:  Sterile saline   Irrigation method:  Syringe Skin repair:    Repair method:  Tissue adhesive Approximation:    Approximation:  Close Post-procedure details:    Dressing:  Open (no dressing)   Patient tolerance of procedure:  Tolerated well, no immediate complications   (including critical care time)  Medications Ordered in ED Medications  Tdap (BOOSTRIX) injection 0.5 mL (has no administration in time range)    ED Course  I have reviewed the triage vital signs and the nursing notes.  Pertinent labs & imaging results that were available during my care of the patient were reviewed by me and considered in my medical decision making (see chart for details).   Irrigated copiously due to creek water.  Given the creek exposure will cover with doxycycline to prevent wound infection.    No head injury on CT scan, wound closed and wound care instructions given.  Strict return precautions given.  Patient is clinically sober and PO  challenged in the department and ambulated.  Stable for discharge with close follow up.    Robert [name] was evaluated in Emergency Department on 09/15/2019 for the symptoms described in the history of present illness. He was evaluated in the context of the global COVID-19 pandemic, which necessitated consideration that the patient might be at risk for infection with the SARS-CoV-2 virus that causes COVID-19. Institutional protocols and algorithms that pertain to the evaluation of patients at risk for COVID-19 are in a state of rapid change based on information released by regulatory bodies including the CDC and federal and state organizations. These policies and algorithms were followed during the patient's care in the ED.  Final Clinical Impression(s) / ED Diagnoses Return for intractable cough, coughing up blood,fevers >100.4 unrelieved by medication, shortness of breath, intractable vomiting, chest pain, shortness of breath, weakness,numbness, changes in speech, facial asymmetry,abdominal pain, passing out,Inability to tolerate liquids or food, cough, altered mental status or any concerns. No signs of systemic illness or infection. The patient is nontoxic-appearing on exam and vital signs are within normal limits.   I have reviewed the triage vital signs and the nursing notes. Pertinent labs &imaging results that were available during my care of the patient were reviewed by me and considered in my medical decision making (see chart for details).After history, exam, and medical workup I feel the patient has beenappropriately medically screened and is safe for discharge home. Pertinent diagnoses were discussed with the patient. Patient was given return precautions.   Robert Crispen, MD 09/15/19 2340    Robert Dunkel, MD 09/15/19 2348

## 2019-09-16 ENCOUNTER — Encounter (HOSPITAL_COMMUNITY): Payer: Self-pay

## 2019-09-16 ENCOUNTER — Emergency Department (HOSPITAL_COMMUNITY)
Admission: EM | Admit: 2019-09-16 | Discharge: 2019-09-18 | Disposition: A | Discharge status: Home / Self Care | Payer: Self-pay | Attending: Emergency Medicine | Admitting: Emergency Medicine

## 2019-09-16 ENCOUNTER — Emergency Department (HOSPITAL_COMMUNITY): Payer: Self-pay

## 2019-09-16 ENCOUNTER — Other Ambulatory Visit: Payer: Self-pay

## 2019-09-16 ENCOUNTER — Emergency Department (HOSPITAL_COMMUNITY)
Admission: EM | Admit: 2019-09-16 | Discharge: 2019-09-16 | Disposition: A | Discharge status: Home / Self Care | Payer: Self-pay | Attending: Emergency Medicine | Admitting: Emergency Medicine

## 2019-09-16 ENCOUNTER — Encounter (HOSPITAL_COMMUNITY): Payer: Self-pay | Admitting: Emergency Medicine

## 2019-09-16 DIAGNOSIS — Y929 Unspecified place or not applicable: Secondary | ICD-10-CM | POA: Insufficient documentation

## 2019-09-16 DIAGNOSIS — R1084 Generalized abdominal pain: Secondary | ICD-10-CM

## 2019-09-16 DIAGNOSIS — Y939 Activity, unspecified: Secondary | ICD-10-CM | POA: Insufficient documentation

## 2019-09-16 DIAGNOSIS — R45851 Suicidal ideations: Secondary | ICD-10-CM

## 2019-09-16 DIAGNOSIS — R110 Nausea: Secondary | ICD-10-CM | POA: Insufficient documentation

## 2019-09-16 DIAGNOSIS — Z5321 Procedure and treatment not carried out due to patient leaving prior to being seen by health care provider: Secondary | ICD-10-CM | POA: Insufficient documentation

## 2019-09-16 DIAGNOSIS — S0081XA Abrasion of other part of head, initial encounter: Secondary | ICD-10-CM | POA: Insufficient documentation

## 2019-09-16 DIAGNOSIS — F1594 Other stimulant use, unspecified with stimulant-induced mood disorder: Secondary | ICD-10-CM | POA: Diagnosis present

## 2019-09-16 DIAGNOSIS — F1120 Opioid dependence, uncomplicated: Secondary | ICD-10-CM

## 2019-09-16 DIAGNOSIS — Z20822 Contact with and (suspected) exposure to covid-19: Secondary | ICD-10-CM | POA: Insufficient documentation

## 2019-09-16 DIAGNOSIS — Y999 Unspecified external cause status: Secondary | ICD-10-CM | POA: Insufficient documentation

## 2019-09-16 DIAGNOSIS — T40601A Poisoning by unspecified narcotics, accidental (unintentional), initial encounter: Secondary | ICD-10-CM

## 2019-09-16 DIAGNOSIS — F1994 Other psychoactive substance use, unspecified with psychoactive substance-induced mood disorder: Secondary | ICD-10-CM | POA: Diagnosis present

## 2019-09-16 DIAGNOSIS — W01198A Fall on same level from slipping, tripping and stumbling with subsequent striking against other object, initial encounter: Secondary | ICD-10-CM | POA: Insufficient documentation

## 2019-09-16 LAB — CBC
HCT: 44.2 % (ref 39.0–52.0)
Hemoglobin: 14.6 g/dL (ref 13.0–17.0)
MCH: 31 pg (ref 26.0–34.0)
MCHC: 33 g/dL (ref 30.0–36.0)
MCV: 93.8 fL (ref 80.0–100.0)
Platelets: 179 10*3/uL (ref 150–400)
RBC: 4.71 MIL/uL (ref 4.22–5.81)
RDW: 13.3 % (ref 11.5–15.5)
WBC: 10.2 10*3/uL (ref 4.0–10.5)
nRBC: 0 % (ref 0.0–0.2)

## 2019-09-16 LAB — COMPREHENSIVE METABOLIC PANEL
ALT: 436 U/L — ABNORMAL HIGH (ref 0–44)
AST: 199 U/L — ABNORMAL HIGH (ref 15–41)
Albumin: 4.4 g/dL (ref 3.5–5.0)
Alkaline Phosphatase: 74 U/L (ref 38–126)
Anion gap: 13 (ref 5–15)
BUN: 15 mg/dL (ref 6–20)
CO2: 22 mmol/L (ref 22–32)
Calcium: 9.2 mg/dL (ref 8.9–10.3)
Chloride: 97 mmol/L — ABNORMAL LOW (ref 98–111)
Creatinine, Ser: 0.66 mg/dL (ref 0.61–1.24)
GFR calc Af Amer: >60 mL/min (ref >60)
GFR calc non Af Amer: >60 mL/min (ref >60)
Glucose, Bld: 112 mg/dL — ABNORMAL HIGH (ref 70–99)
Potassium: 3.6 mmol/L (ref 3.5–5.1)
Sodium: 132 mmol/L — ABNORMAL LOW (ref 135–145)
Total Bilirubin: 2 mg/dL — ABNORMAL HIGH (ref 0.3–1.2)
Total Protein: 7.8 g/dL (ref 6.5–8.1)

## 2019-09-16 LAB — LIPASE, BLOOD: Lipase: 17 U/L (ref 11–51)

## 2019-09-16 LAB — ETHANOL: Alcohol, Ethyl (B): <10 mg/dL (ref <10)

## 2019-09-16 MED ORDER — SODIUM CHLORIDE 0.9% FLUSH: 3.0000 mL | Freq: Once | INTRAVENOUS | Status: DC

## 2019-09-16 NOTE — ED Notes (Addendum)
Called to recheck vitals. No answer. Also no visual of Pt. Also checked outside ER

## 2019-09-16 NOTE — ED Notes (Signed)
Pt. Given gown, clothes were wet, pt. fell in a creek.

## 2019-09-16 NOTE — ED Notes (Signed)
Called to update vitals. No answer

## 2019-09-16 NOTE — ED Triage Notes (Addendum)
Patient fell and hit his head against a rock last night , no LOC/ambulatory , reports swelling with abrasion at right lower forehead , seen at [facility] ER this evening . Head CT scan result is negative .

## 2019-09-16 NOTE — ED Triage Notes (Signed)
Pt arrives to ED w/ c/o 8/10 R/LLQ abdominal pain. Pt endores n/v. Pt denies diarrhea. Pt states emesis is brown and syrup like in consistency. Pt seen this AM for a fall in which he hit his head on a rock. Head CT was negative.

## 2019-09-17 ENCOUNTER — Emergency Department (HOSPITAL_COMMUNITY): Payer: Self-pay

## 2019-09-17 LAB — SARS CORONAVIRUS 2 BY RT PCR (HOSPITAL ORDER, PERFORMED IN ~~LOC~~ HOSPITAL LAB): SARS Coronavirus 2: NEGATIVE

## 2019-09-17 MED ORDER — IOHEXOL 300 MG/ML  SOLN
100.0000 mL | Freq: Once | INTRAMUSCULAR | Status: AC | PRN
  Administered 2019-09-17: 100 mL via INTRAVENOUS

## 2019-09-17 MED ORDER — ONDANSETRON 4 MG PO TBDP
8.0000 mg | ORAL_TABLET | Freq: Three times a day (TID) | ORAL | Status: DC | PRN
  Administered 2019-09-17 – 2019-09-18 (×2): 8 mg via ORAL
  Filled 2019-09-17 (×2): qty 2

## 2019-09-17 MED ORDER — ACETAMINOPHEN 325 MG PO TABS
650.0000 mg | ORAL_TABLET | ORAL | Status: DC | PRN
  Administered 2019-09-17 – 2019-09-18 (×3): 650 mg via ORAL
  Filled 2019-09-17 (×3): qty 2

## 2019-09-17 NOTE — BH Assessment (Addendum)
Per Serena Colonel, NP, patient meets criteria for overnight observation at the [facility], pending medical clearance. The EDP was asked to document medical clearance prior to transfer to the [facility]. Several attempts were made to inform the nurse at the [facility] of patient's acceptance to [facility] and to provide updates on the medical clearance request from Kayren Eaves, NP.   Malachy Chamber would like patient medically cleared before his transfer to [facility]. Clinician has asked this information to be documented by EDP prior to his transfer over to the [facility].

## 2019-09-17 NOTE — BH Assessment (Addendum)
Assessment Note  Robert [name] is an 39 y.o. male with history of MDD and Anxiety. He presents to [facility], voluntary. He was transported to [facility] by an acquitance. Approximately 2 days ago, patient was transported to the hospital by EMS. EMS stated that patient is homeless, head laceration to right forehead after falling into creek. Patient reported hitting his head on rock.   He presents today with suicidal ideations stating he tried to intentionally overdose last night. The overdose occurred at his drug dealers home. His intentional overdose was on Heroin and he used 2 grams. When asked about his triggers and/stressors he states, "Life". He was unable to identify anything specific. He has no history of suicide attempts and/gestures. He denies self mutilating behaviors. He reports several depressive symptoms such as isolating self from others, guilt, hopelessness, and loss of interest in usual pleasures. He has a family history of mental illness. His mother and grandmother are diagnosed with depression. He reports that his appetite is poor. States he hasn't eaten in 2 days. His sleep is noted as poor.   Patient denies HI. He does report auditory hallucinations "chatters" when he is under the influence of drugs. No visual hallucinations reported. He does not appear to be responding to internal stimuli.   Patient does not have a therapist and/or psychiatrist. He has lived in an [address] x3 weeks until 09/13/19. Patient is now homeless going from home to home. He states that he would never want to return to the [facility] and was vague about the reason.   Patient with a significant history of substance use. He has a history of heroin use, THC, alcohol, crack cocaine, acid, and methamphetamine. SEE ADDITIONAL SOCIAL HISTORY for details related to patient's substance use. Patient has participated in treatment and several hospitals in the past. He names multiple facilities. The last facility was [facility]-June  2021.  Diagnosis:   Past Medical History:  Past Medical History:  Diagnosis Date  . Anxiety   . Arthritis   . Cellulitis 09/2018  . Chronic hepatitis C (HCC) 08/07/2019  . Depression   . Hepatitis    via pts mother  . 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 Nicotine dependence, cigarettes, uncomplicated 08/07/2019    Past Surgical History:  Procedure Laterality Date  . GALLBLADDER SURGERY      Family History:  Family History  Problem Relation Age of Onset  . Diabetes Mother   . Diabetes Maternal Grandfather     Social History:  reports that he has been smoking cigarettes. He has been smoking about 1.00 pack per day. He has never used smokeless tobacco. He reports current alcohol use. He reports current drug use. Drugs: Cocaine, Heroin, and Methamphetamines.  Additional Social History:  Alcohol / Drug Use Pain Medications: See PTA Prescriptions: See PTA Over the Counter: See PTA History of alcohol / drug use?: Yes Longest period of sobriety (when/how long): 18 mos (began Oct 2015) most recent peroid of sobriety was 15 mos Negative Consequences of Use: Surveyor, quantity, Armed forces operational officer, Personal relationships, Work / Programmer, multimedia Withdrawal Symptoms: Agitation, Tingling Substance #1 Name of Substance 1: Crack Cocaine 1 - Age of First Use: 39 yrs old 1 - Amount (size/oz): "Whatever I have the money for" 1 - Frequency: "Not much" 1 - Duration: since the age of 38 1 - Last Use / Amount: 09/17/19 Substance #2 Name of Substance 2: Alcohol 2 - Age of First Use: 16 2 - Amount (size/oz): varies 2 -  Frequency: "I only drink when I am high....it's not often" 2 - Duration: "Since I was 71 or 40 yrs old old" 2 - Last Use / Amount: 09/16/2019; "Couple of 22 ounce beers" Substance #3 Name of Substance 3: Methamphetamine 3 - Age of First Use: 39 yrs old 3 - Amount (size/oz): "a gram" 3 - Frequency: 1x every 6 months 3 - Duration: on-going 3 - Last Use /  Amount: 09/16/19 Substance #4 Name of Substance 4: THC 4 - Age of First Use: 39 yrs old 4 - Amount (size/oz): 1 gram per day 4 - Frequency: daily  4 - Duration: Ongoing 4 - Last Use / Amount: 09/13/2019" Substance #5 Name of Substance 5: Ecstasy 5 - Age of First Use: unk 5 - Amount (size/oz): unk 5 - Frequency: unk 5 - Duration: unk 5 - Last Use / Amount: 10 yrs ago Substance #6 Name of Substance 6: Acid 6 - Age of First Use: unk 6 - Amount (size/oz): unk 6 - Frequency: unk 6 - Duration: unk 6 - Last Use / Amount: 10 yrs ago Substance #7 Name of Substance 7: Heroin 7 - Age of First Use: 39 yrs old 7 - Amount (size/oz): "As much as I can get" 7 - Frequency: daily 7 - Duration: on-goin 7 - Last Use / Amount: 09/15/19 (2 grams)  CIWA: CIWA-Ar BP: 128/72 Pulse Rate: 60 COWS:    Allergies:  Allergies  Allergen Reactions  . Hydrocodone     nausea    Home Medications: (Not in a hospital admission)   OB/GYN Status:  No LMP for male patient.  General Assessment Data Location of Assessment: [facility] ED TTS Assessment: In system Is this a Tele or Face-to-Face Assessment?: Tele Assessment Is this an Initial Assessment or a Re-assessment for this encounter?: Initial Assessment Patient Accompanied by::  (brought by friends via car ) Language Other than English: No Living Arrangements:  (currently homeless; "I stay where ever I can") What gender do you identify as?: Male Date Telepsych consult ordered in [facility]:  (09/17/19) Juanell Fairly name:  (n/a) Pregnancy Status: No Living Arrangements:  ("I live in [address]" x3 weeks ) Can pt return to current living arrangement?: No Admission Status: Voluntary Is patient capable of signing voluntary admission?: No Referral Source: Self/Family/Friend Insurance type:  (self pay )     Crisis Care Plan Living Arrangements:  ("I live in [place]" x3 weeks ) Legal Guardian:  (no legal guardian ) Name of Psychiatrist:  (no psychiatrist  ) Name of Therapist:  (no therapist )  Education Status Is patient currently in school?: No Is the patient employed, unemployed or receiving disability?: Unemployed  Risk to self with the past 6 months Suicidal Ideation: Yes-Currently Present Has patient been a risk to self within the past 6 months prior to admission? : Yes ("Suicical thoughts for yrs; plan/intent x1 week") Suicidal Intent: Yes-Currently Present Has patient had any suicidal intent within the past 6 months prior to admission? : Yes Is patient at risk for suicide?: Yes Suicidal Plan?: Yes-Currently Present Has patient had any suicidal plan within the past 6 months prior to admission? 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 ) Specify Access to Suicidal Means:  (drugs ) Previous Attempts/Gestures: No How many times?:  (0) Other Self Harm Risks:  (patient denies ) Triggers for Past Attempts: Other (Comment) (no past attempts ) Family Suicide  History: Yes (mother and maternal grandmother-depression ) Recent stressful life event(s): Other (Comment) ("I'm just tired"; pt did not identify any specific stresors ) Persecutory voices/beliefs?: No Depression: Yes Depression Symptoms: Feeling worthless/self pity, Loss of interest in usual pleasures, Fatigue, Isolating Substance abuse history and/or treatment for substance abuse?: No Suicide prevention information given to non-admitted patients: Not applicable  Risk to Others within the past 6 months Homicidal Ideation: No Does patient have any lifetime risk of violence toward others beyond the six months prior to admission? : No Thoughts of Harm to Others: No Current Homicidal Intent: No Current Homicidal Plan: No Access to Homicidal Means: No Identified Victim:  (n/a) History of harm to others?: No Assessment of Violence: None Noted Violent Behavior Description:  (currently calm  and cooperative ) Does patient have access to weapons?: Yes (Comment) (owns a firearm ) Criminal Charges Pending?: Yes Describe Pending Criminal Charges:  (simple assault ) Does patient have a court date: Yes Court Date:  (10/23/2019)  Psychosis Hallucinations:  ("only with drug use"; "I feel paranoid") Delusions: None noted  Mental Status Report Appearance/Hygiene: In hospital gown Eye Contact: Fair Motor Activity: Unremarkable Speech: Logical/coherent Level of Consciousness: Alert Mood: Sad Affect: Depressed Anxiety Level: Panic Attacks Panic attack frequency:  ("I was having them but some how they stopped") Most recent panic attack:  (5 yrs ago ) Thought Processes: Relevant Judgement: Impaired Orientation: Person, Situation, Time, Place Obsessive Compulsive Thoughts/Behaviors: None  Cognitive Functioning Concentration: Normal Memory: Recent Intact, Remote Intact Is patient IDD: No Insight: Poor Impulse Control: Poor Appetite: Poor ("I haven't eaten in 2 days") Have you had any weight changes? : No Change Sleep:  ("Very little") Total Hours of Sleep:  (3-4 hrs ) Vegetative Symptoms: None  ADLScreening [facility] Assessment Services) Patient's cognitive ability adequate to safely complete daily activities?: Yes Patient able to express need for assistance with ADLs?: Yes Independently performs ADLs?: Yes (appropriate for developmental age)  Prior Inpatient Therapy Prior Inpatient Therapy: Yes Prior Therapy Dates:  (last substance abuse facility was [facility] x1 month ago) Prior Therapy Facilty/Provider(s):  ([facility], [facility], [facility], [facility]) Reason for Treatment:  (subsbtance abuse )  Prior Outpatient Therapy Prior Outpatient Therapy: No Does patient have an ACCT team?: No Does patient have Intensive In-House Services?  : No Does patient have [facility] services? : No Does patient have P4CC services?: No  ADL Screening (condition at time of admission) Patient's  cognitive ability adequate to safely complete daily activities?: Yes Patient able to express need for assistance with ADLs?: Yes Independently performs ADLs?: Yes (appropriate for developmental age)                        Disposition:     On Site Evaluation by:   Reviewed with Physician:    Melynda Ripple 09/17/2019 4:51 PM

## 2019-09-17 NOTE — ED Notes (Signed)
No answer for VS x1 

## 2019-09-17 NOTE — ED Notes (Signed)
Two separate RNs attempted to start IV however pt has used IV drugs in the past and as he states, "I have blown all my veins."

## 2019-09-17 NOTE — ED Provider Notes (Signed)
[facility]   CSN: [id number] Arrival date & time: 09/16/19  2137     History Chief Complaint  Patient presents with  . Nausea    Robert SinclairJoseph Bielinski is a 39 y.o. male. Patient evaluated, after prolonged stay, in the waiting room, before a examination room was available.  Protocol evaluation was done while waiting.  HPI He returned to the emergency department, after overdosing on heroin and developing abdominal pain.  He also had a couple episodes of vomiting, dark brown material.  He had been seen earlier, yesterday, after an injury from a fall.  He was comprehensively evaluated with imaging, and had a laceration forehead repaired.  He states he was "at the dealers house yesterday when I overdosed."  [place] [facility].  He denies fever, chills, cough, shortness of breath, weakness or dizziness.  There are no other known modifying factors.    Past Medical History:  Diagnosis Date  . Anxiety   . Arthritis   . Cellulitis 09/2018  . Chronic hepatitis C (HCC) 08/07/2019  . Depression   . Hepatitis    via pts mother  . 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 Nicotine dependence, cigarettes, uncomplicated 08/07/2019    Patient Active Problem List   Diagnosis Date Noted  . Cellulitis of right hand 08/08/2019  . Cellulitis of hand, right 08/07/2019  . Chronic hepatitis C (HCC) 08/07/2019  . Nicotine dependence, cigarettes, uncomplicated 08/07/2019  . Right arm cellulitis 08/07/2019  . Cellulitis of leg, right   . Cellulitis and abscess of upper extremity 02/28/2018  . Cocaine use disorder, moderate, dependence (HCC) 07/07/2017  . 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 Substance induced mood disorder (HCC)  03/08/2017  . Alcoholism (HCC) 09/12/2014  . Intravenous drug abuse (HCC) 09/12/2014    Past Surgical History:  Procedure Laterality Date  . GALLBLADDER SURGERY         Family History  Problem Relation Age of Onset  . Diabetes Mother   . Diabetes Maternal Grandfather     Social History   Tobacco Use  . Smoking status: Current Every Day Smoker    Packs/day: 1.00    Types: Cigarettes  . Smokeless tobacco: Never Used  Vaping Use  . Vaping Use: Some days  Substance Use Topics  . Alcohol use: Yes    Alcohol/week: 0.0 standard drinks  . Drug use: Yes    Types: Cocaine, Heroin, Methamphetamines    Home Medications Prior to Admission medications   Medication Sig Start Date End Date Taking? Authorizing Provider  amoxicillin-clavulanate (AUGMENTIN) 875-125 MG tablet Take 1 tablet by mouth every 12 (twelve) hours. Patient not taking: Reported on 09/17/2019 08/10/19   Rhetta MuraSamtani, Jai-Gurmukh, MD  doxycycline (VIBRAMYCIN) 100 MG capsule Take 1 capsule (100 mg total) by mouth 2 (two) times daily. One po bid x 7 days 09/15/19   Palumbo, April, MD  hydrOXYzine (ATARAX/VISTARIL) 25 MG tablet Take 1 tablet (25 mg total) by mouth every 6 (six) hours as needed for anxiety. Patient not taking: Reported on 09/17/2019 08/10/19   Rhetta MuraSamtani, Jai-Gurmukh, MD  oxyCODONE (OXY IR/ROXICODONE) 5 MG immediate release tablet Take 1 tablet (5 mg total) by mouth every 4 (four) hours as needed for moderate pain or severe pain. Patient not taking: Reported on 09/17/2019 08/10/19  Rhetta Mura, MD  pantoprazole (PROTONIX) 40 MG tablet Take 1 tablet (40 mg total) by mouth daily. Patient not taking: Reported on 09/17/2019 08/11/19   Rhetta Mura, MD    Allergies    Hydrocodone  Review of Systems   Review of Systems  All other systems reviewed and are negative.   Physical Exam Updated Vital Signs BP (!) 111/61   Pulse 74   Temp 97.7 F (36.5 C) (Oral)   Resp 18   Ht  (1.651 m)   Wt  86.2 kg   SpO2 95%   BMI 31.62 kg/m   Physical Exam Vitals and nursing note reviewed.  Constitutional:      General: He is not in acute distress.    Appearance: Normal appearance. He is well-developed. He is not ill-appearing, toxic-appearing or diaphoretic.  HENT:     Head: Normocephalic.     Comments: Abrasions and contusion with laceration right forehead, appear to be healing without bleeding, drainage or tenderness.    Right Ear: External ear normal.     Left Ear: External ear normal.     Mouth/Throat:     Mouth: Mucous membranes are moist.     Pharynx: No oropharyngeal exudate or posterior oropharyngeal erythema.  Eyes:     General:        Right eye: No discharge.        Left eye: No discharge.     Conjunctiva/sclera: Conjunctivae normal.     Pupils: Pupils are equal, round, and reactive to light.     Comments: No hyphema  Neck:     Trachea: Phonation normal.  Cardiovascular:     Rate and Rhythm: Normal rate and regular rhythm.     Heart sounds: Normal heart sounds.  Pulmonary:     Effort: Pulmonary effort is normal.     Breath sounds: Normal breath sounds.  Abdominal:     General: There is no distension.     Palpations: Abdomen is soft.     Tenderness: There is no abdominal tenderness. There is no guarding.  Musculoskeletal:        General: Normal range of motion.     Cervical back: Normal range of motion and neck supple.  Skin:    General: Skin is warm and dry.  Neurological:     Mental Status: He is alert and oriented to person, place, and time.     Cranial Nerves: No cranial nerve deficit.     Sensory: No sensory deficit.     Motor: No abnormal muscle tone.     Coordination: Coordination normal.  Psychiatric:        Mood and Affect: Mood normal.        Behavior: Behavior normal.        Thought Content: Thought content normal.        Judgment: Judgment normal.     ED Results / Procedures / Treatments   Labs (all labs ordered are listed, but only  abnormal results are displayed) Labs Reviewed  COMPREHENSIVE METABOLIC PANEL - Abnormal; Notable for the following components:      Result Value   Sodium 132 (*)    Chloride 97 (*)    Glucose, Bld 112 (*)    AST 199 (*)    ALT 436 (*)    Total Bilirubin 2.0 (*)    All other components within normal limits  SARS CORONAVIRUS 2 BY RT PCR (HOSPITAL ORDER, PERFORMED IN [facility])  LIPASE, BLOOD  CBC  ETHANOL  URINALYSIS, ROUTINE W REFLEX MICROSCOPIC    EKG None  Radiology DG Chest 2 View  Result Date: 09/16/2019 CLINICAL DATA:  Overdose. EXAM: CHEST - 2 VIEW COMPARISON:  None. FINDINGS: There is no evidence of acute infiltrate, pleural effusion or pneumothorax. The heart size and mediastinal contours are within normal limits. Radiopaque surgical clips are seen within the right upper quadrant. The visualized skeletal structures are unremarkable. IMPRESSION: No active cardiopulmonary disease. Electronically Signed   By: Aram Candela M.D.   On: 09/16/2019 22:30   CT Head Wo Contrast  Result Date: 09/16/2019 CLINICAL DATA:  Larey Seat, hit head on a rock, illicit drug abuse EXAM: CT HEAD WITHOUT CONTRAST TECHNIQUE: Contiguous axial images were obtained from the base of the skull through the vertex without intravenous contrast. COMPARISON:  None. FINDINGS: Brain: No acute infarct or hemorrhage. Lateral ventricles and midline structures are unremarkable. No acute extra-axial fluid collections. No mass effect. Vascular: No hyperdense vessel or unexpected calcification. Skull: Right frontal scalp laceration. Negative for fracture or focal lesion. Sinuses/Orbits: Minimal polypoid mucosal thickening within the sphenoid and maxillary sinuses. Remaining sinuses are clear. Other: None. IMPRESSION: 1. Right frontal scalp laceration. 2. No acute intracranial process. Electronically Signed   By: Sharlet Salina M.D.   On: 09/16/2019 01:10   CT ABDOMEN PELVIS W CONTRAST  Result Date:  09/17/2019 CLINICAL DATA:  Abdominal pain. EXAM: CT ABDOMEN AND PELVIS WITH CONTRAST TECHNIQUE: Multidetector CT imaging of the abdomen and pelvis was performed using the standard protocol following bolus administration of intravenous contrast. CONTRAST:  OMNIPAQUE IOHEXOL 300 MG/ML  SOLN COMPARISON:  04/07/2018 FINDINGS: Lower chest: Patchy ground-glass opacities at the lung bases. No focal airspace consolidation. Recommend correlation with COVID status. No pleural effusions. The heart is normal in size. Hepatobiliary: No hepatic lesions or intrahepatic biliary dilatation. The gallbladder is surgically absent. Mild associated common bile duct dilatation which is stable. Pancreas: No mass, inflammation or ductal dilatation. Spleen: Normal size. No focal lesions. Adrenals/Urinary Tract: The adrenal glands and kidneys are normal. The bladder is mildly distended. No mass or calculus. Stomach/Bowel: The stomach, duodenum, small bowel and colon are unremarkable. No acute inflammatory changes, mass lesions or obstructive findings. The terminal ileum is normal. The appendix is normal. Vascular/Lymphatic: The aorta is normal in caliber. No dissection. The branch vessels are patent. The major venous structures are patent. No mesenteric or retroperitoneal mass or adenopathy. Small scattered lymph nodes are noted. Reproductive: The prostate gland and seminal vesicles are unremarkable. Other: No pelvic mass or adenopathy. No free pelvic fluid collections. No inguinal mass or adenopathy. No abdominal wall hernia or subcutaneous lesions. Musculoskeletal: No significant bony findings. IMPRESSION: 1. No acute abdominal/pelvic findings, mass lesions or adenopathy. 2. Status post cholecystectomy with mild associated common bile duct dilatation. 3. Patchy ground-glass opacities at the lung bases. Recommend correlation with COVID status. Electronically Signed   By: Rudie Meyer M.D.   On: 09/17/2019 12:58     Procedures Procedures (including critical care time)  Medications Ordered in ED Medications  sodium chloride flush (NS) 0.9 % injection 3 mL (has no administration in time range)  iohexol (OMNIPAQUE) 300 MG/ML solution 100 mL (100 mLs Intravenous Contrast Given 09/17/19 1244)    ED Course  I have reviewed the triage vital signs and the nursing notes.  Pertinent labs & imaging results that were available during my care of the patient were reviewed by me and considered in my medical decision making (see chart for  details).    MDM Rules/Calculators/A&P                           Patient Vitals for the past 24 hrs:  BP Temp Temp src Pulse Resp SpO2 Height Weight  09/17/19 1430 (!) 111/61 -- -- 74 -- 95 % -- --  09/17/19 1358 (!) 107/57 -- -- 73 -- 96 % -- --  09/17/19 1330 (!) 116/54 -- -- 74 -- 96 % -- --  09/17/19 1315 (!) 105/60 -- -- 45 -- 96 % -- --  09/17/19 1200 (!) 109/56 -- -- 75 -- 98 % -- --  09/17/19 1130 (!) 92/45 -- -- 74 -- 97 % -- --  09/17/19 1102 (!) 132/73 -- -- 77 -- 98 % -- --  09/17/19 1047 121/67 -- -- 78 -- 97 % -- --  09/17/19 1029 122/71 -- -- 73 -- 97 % -- --  09/17/19 0927 (!) 119/93 97.7 F (36.5 C) Oral 75 18 99 % -- --  09/17/19 0612 115/78 -- -- 82 18 94 % -- --  09/17/19 0245 108/67 -- -- 88 16 94 % -- --  09/16/19 2148 -- -- -- -- -- -- 5\' 5"  (1.651 m) 86.2 kg  09/16/19 2147 (!) 137/84 99.5 F (37.5 C) Oral 105 18 94 % -- --    3:07 PM  reevaluation with update and discussion. After initial assessment and treatment, an updated evaluation reveals no change in clinical status.  I explained the findings to the patient and indicated that he could be discharged.  At that point he told me "I have been thinking of suicide and plan to overdose."  Note that he is currently living in a facility, where he is required to be sober, and cannot return there.  All questions were answered. 2148   Medical Decision Making:  This patient is presenting  for evaluation of abdominal pain and suicidal ideation, which does require a range of treatment options, and is a complaint that involves a high risk of morbidity and mortality. The differential diagnoses include injury from fall, acute viral or bacterial infection, complications from drug abuse. I decided to review old records, and in summary middle-aged male, with fall yesterday, later developed abdominal pain after "overdosing on heroin."  He claims to be suicidal with a plan to overdose.  I did not require additional historical information from anyone.  Clinical Laboratory Tests Ordered, included CBC, Metabolic panel, Urinalysis and Lipase, alcohol level. Review indicates normal except sodium low, glucose high, AST high, ALT high, total bilirubin high. Radiologic Tests Ordered, included chest x-ray, CT abdomen pelvis.  I independently Visualized: Radiographic images, which show no acute abnormality    Critical Interventions-clinical evaluation, laboratory testing, radiographic imaging, observation reassessment   After These Interventions, the Patient was reevaluated and was found to be suicidal when told that his testing was normal.  He is a narcotics addict with a reported overdose, greater than 14 hours ago.  He has normal sensorium at this time.  He does not appear clinically intoxicated.  Patient will be evaluated by TTS prior to disposition  CRITICAL CARE-no Performed by: Mancel Bale  Nursing Notes Reviewed/ Care Coordinated Applicable Imaging Reviewed Interpretation of Laboratory Data incorporated into ED treatment  Plan disposition by oncoming provider team after consultation by TTS    Final Clinical Impression(s) / ED Diagnoses Final diagnoses:  Suicidal ideation  Narcotic overdose, accidental or unintentional, initial encounter (HCC)  Narcotic addiction (HCC)  Generalized abdominal pain    Rx / DC Orders ED Discharge Orders    None       Mancel Bale,  MD 09/17/19 1513

## 2019-09-17 NOTE — ED Provider Notes (Signed)
Patient continued to be observed in the emergency room while being evaluate by behavioral health.  On reassessment patient feels improved physically however mentally he does not feel stable and had an overdose as well.  Patient does want help inpatient.  Patient at this time is medically clear with recent CT head negative, CT abdomen pelvis no acute abnormalities, blood work reviewed, Na 132, with LFTs elevated from drug/ alcohol abuse.   Labs Reviewed  COMPREHENSIVE METABOLIC PANEL - Abnormal; Notable for the following components:      Result Value   Sodium 132 (*)    Chloride 97 (*)    Glucose, Bld 112 (*)    AST 199 (*)    ALT 436 (*)    Total Bilirubin 2.0 (*)    All other components within normal limits  SARS CORONAVIRUS 2 BY RT PCR (HOSPITAL ORDER, PERFORMED IN [facility])  LIPASE, BLOOD  CBC  ETHANOL  URINALYSIS, ROUTINE W REFLEX MICROSCOPIC    Kenton Kingfisher, MD 09/17/19 2053

## 2019-09-17 NOTE — ED Notes (Signed)
Belongings placed in locker #6. iPhone given to security.

## 2019-09-17 NOTE — ED Notes (Signed)
Please call Lenore Manner for update on pt at [phone] when you get a chance

## 2019-09-18 LAB — RAPID URINE DRUG SCREEN, HOSP PERFORMED
Amphetamines: POSITIVE — AB
Barbiturates: NOT DETECTED
Benzodiazepines: NOT DETECTED
Cocaine: POSITIVE — AB
Opiates: NOT DETECTED
Tetrahydrocannabinol: POSITIVE — AB

## 2019-09-18 NOTE — ED Notes (Signed)
Pt given dc instructions pt refused to take them. Pt given all of his belongings from purple and given his valuables ( Phone) from security.

## 2019-09-18 NOTE — ED Notes (Addendum)
Pt in RR with door locked for too long. This RN told pt he had to come out. Pt got angry with RN saying this was ridiculous and so on. This RN explained to him that the problem was that he locked the door where staff couldn't open the door to observe him if needed. Pt didn't realize that was the issue. Pt calm and relaxing

## 2019-09-18 NOTE — ED Notes (Signed)
Breakfast ordered 

## 2019-09-18 NOTE — ED Notes (Signed)
Pt up c/o having to urinate but unable to, pt requesting to " stay in bathroom , however long it takes to use it". Pt made aware that unfortunately it is a safety risk and he cannot remain in the bathroom to extreme periods of time. Pt states he will do what he wants or go home. Pt offered that an order can be placed for straight cath if he is in extreme pain, pt denies

## 2019-09-18 NOTE — Care Management (Addendum)
1500 Patient states that he wants to get detox, we do not actively detox here. We reached out to different centers. Will get transportation for patient to girlfriends house.  She states she can find somewhere for him to detox.   1700 still no orders clearing patient from psych spoke to [place] from psych. He will be talking to Psych MD and calling back.   1720 orders being placed in epic for discharge

## 2019-09-18 NOTE — ED Notes (Signed)
Pt given belongings, and phone back. Pt signed paper. Pt waiting on discharge papers.

## 2019-09-18 NOTE — Consult Note (Signed)
[facility] Psych ED Discharge  09/18/2019 5:08 PM Robert [name]  MRN:  [id number] Principal Problem: Substance induced mood disorder [facility]) Discharge Diagnoses: Principal Problem:   Substance induced mood disorder (HCC)   Subjective: I tried to OD on heroin. Why do you all keep asking me the same questions? You moved out of a room into a hallway bed and I been here all night. What is wrong with you people?   HPI:  Robert [name] is an 39 y.o. male with history of MDD and Anxiety. He presents to [facility], voluntary. He was transported to [facility] by an acquitance. Approximately 2 days ago, patient was transported to the hospital by EMS. EMS stated that patient is homeless, head laceration to right forehead after falling into creek. Patient reported hitting his head on rock. He presents today with suicidal ideations stating he tried to intentionally overdose last night. The overdose occurred at his drug dealers home. His intentional overdose was on Heroin and he used 2 grams. When asked about his triggers and/stressors he states, "Life". He was unable to identify anything specific. He has no history of suicide attempts and/gestures. He denies self mutilating behaviors. He reports several depressive symptoms such as isolating self from others, guilt, hopelessness, and loss of interest in usual pleasures. He has a family history of mental illness. His mother and grandmother are diagnosed with depression. He reports that his appetite is poor. States he hasn't eaten in 2 days. His sleep is noted as poor    39 year old male returns with recurrent suicidal thoughts and alcohol intoxication. He has numerous similar presentations which are concerning for malingering behavior, and he was too intoxicated to discharge home yesterday. He has frequented all the local hospitals approximately 7 times with 3 admissions since 03/2019. He does endorse ongoing suicidal thoughts related to his substance use " I tried to kill myself. No one  will help me get a place. I got kicked out of the [place] house. I cant go back there. If I had a place to go I would be less suicidal and feel better. Im going to keep getting high." However he denies any suicidal ideations at this time. This patient has chronic suicidal ideations related to his chronic substance abuse, at this time he expresses no interest in quitting his substance use but is seeking help for secondary gain and housing due to being homeless. He has been admitted 3x as noted in the past  however leaves AMA or focused on his discharge due to cravings and urges to use. He does endorse having " some faith and has never attempted suicide before'.   On assessment he is calm and cooperative, irritable and very evasive. He is vague in all of his answers, until we asked about his goals and what we can do to assist. At the time he expressed interest in getting assistance with housing, and his suicidal thoughts are 2/t homelessness. He reports not sleeping well as he is homeless. He states his girlfriend Robert [name] is at [facility] receiving detox there and he would like to go there if we could assist with transportation and getting him to [place].  He denies any medical symptoms, body aches, restlessness or shakes. He is focused primarily on housing. Will discharge at this time.   Total Time spent with patient: 30 minutes  Past Psychiatric History: Bipolar Disorder. Previously taking Wellbutrin and Gabapentin.   Past Medical History:  Past Medical History:  Diagnosis Date  . Anxiety   .  Arthritis   . Cellulitis 09/2018  . Chronic hepatitis C (HCC) 08/07/2019  . Depression   . Hepatitis    via pts mother  . 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 Nicotine dependence, cigarettes, uncomplicated 08/07/2019    Past Surgical History:  Procedure Laterality Date  . GALLBLADDER SURGERY     Family History:  Family History  Problem Relation  Age of Onset  . Diabetes Mother   . Diabetes Maternal Grandfather    Family Psychiatric  History: Denies Social History:  Social History   Substance and Sexual Activity  Alcohol Use Yes  . Alcohol/week: 0.0 standard drinks     Social History   Substance and Sexual Activity  Drug Use Yes  . Types: Cocaine, Heroin, Methamphetamines    Social History   Socioeconomic History  . Marital status: Single    Spouse name: Not on file  . Number of children: Not on file  . Years of education: Not on file  . Highest education level: Not on file  Occupational History  . Not on file  Tobacco Use  . Smoking status: Current Every Day Smoker    Packs/day: 1.00    Types: Cigarettes  . Smokeless tobacco: Never Used  Vaping Use  . Vaping Use: Some days  Substance and Sexual Activity  . Alcohol use: Yes    Alcohol/week: 0.0 standard drinks  . Drug use: Yes    Types: Cocaine, Heroin, Methamphetamines  . Sexual activity: Not on file  Other Topics Concern  . Not on file  Social History Narrative  . Not on file   Social Determinants of Health   Financial Resource Strain:   . Difficulty of Paying Living Expenses:   Food Insecurity:   . Worried About Programme researcher, broadcasting/film/video in the Last Year:   . Barista in the Last Year:   Transportation Needs:   . Freight forwarder (Medical):   Marland Kitchen Lack of Transportation (Non-Medical):   Physical Activity:   . Days of Exercise per Week:   . Minutes of Exercise per Session:   Stress:   . Feeling of Stress :   Social Connections:   . Frequency of Communication with Friends and Family:   . Frequency of Social Gatherings with Friends and Family:   . Attends Religious Services:   . Active Member of Clubs or Organizations:   . Attends Banker Meetings:   Marland Kitchen Marital Status:     Has this patient used any form of tobacco in the last 30 days? (Cigarettes, Smokeless Tobacco, Cigars, and/or Pipes) A prescription for an FDA-approved  tobacco cessation medication was offered at discharge and the patient refused  Current Medications: Current Facility-Administered Medications  Medication Dose Route Frequency Provider Last Rate Last Admin  . acetaminophen (TYLENOL) tablet 650 mg  650 mg Oral Q4H PRN Mancel Bale, MD   650 mg at 09/18/19 1558  . ondansetron (ZOFRAN-ODT) disintegrating tablet 8 mg  8 mg Oral Q8H PRN Mancel Bale, MD   8 mg at 09/18/19 1556  . sodium chloride flush (NS) 0.9 % injection 3 mL  3 mL Intravenous Once Cathren Laine, MD       Current Outpatient Medications  Medication Sig Dispense Refill  . amoxicillin-clavulanate (AUGMENTIN) 875-125 MG tablet Take 1 tablet by mouth every 12 (twelve) hours. (Patient not taking: Reported on 09/17/2019) 14 tablet 0  . doxycycline (VIBRAMYCIN) 100 MG capsule Take 1 capsule (100  mg total) by mouth 2 (two) times daily. One po bid x 7 days 14 capsule 0  . hydrOXYzine (ATARAX/VISTARIL) 25 MG tablet Take 1 tablet (25 mg total) by mouth every 6 (six) hours as needed for anxiety. (Patient not taking: Reported on 09/17/2019) 30 tablet 0  . oxyCODONE (OXY IR/ROXICODONE) 5 MG immediate release tablet Take 1 tablet (5 mg total) by mouth every 4 (four) hours as needed for moderate pain or severe pain. (Patient not taking: Reported on 09/17/2019) 6 tablet 0  . pantoprazole (PROTONIX) 40 MG tablet Take 1 tablet (40 mg total) by mouth daily. (Patient not taking: Reported on 09/17/2019) 30 tablet 0   PTA Medications: (Not in a hospital admission)   Musculoskeletal: Strength & Muscle Tone: within normal limits Gait & Station: normal Patient leans: N/A  Psychiatric Specialty Exam: Physical Exam  Review of Systems  Blood pressure (!) 134/67, pulse 54, temperature 98.7 F (37.1 C), temperature source Oral, resp. rate 16, height 5\' 5"  (1.651 m), weight 86.2 kg, SpO2 99 %.Body mass index is 31.62 kg/m.  General Appearance: Fairly Groomed  Eye Contact:  Fair  Speech:  Clear and  Coherent and Normal Rate  Volume:  Normal  Mood:  Irritable  Affect:  Labile  Thought Process:  Goal Directed, Linear and Descriptions of Associations: Intact  Orientation:  Full (Time, Place, and Person)  Thought Content:  Logical  Suicidal Thoughts:  No  Homicidal Thoughts:  No  Memory:  Immediate;   Fair Recent;   Fair  Judgement:  Intact  Insight:  improved  Psychomotor Activity:  Normal  Concentration:  Concentration: Fair and Attention Span: Fair  Recall:  FiservFair  Fund of Knowledge:  Fair  Language:  Fair  Akathisia:  No  Handed:  Right  AIMS (if indicated):     Assets:  Communication Skills Desire for Improvement Financial Resources/Insurance Leisure Time Physical Health  ADL's:  Intact  Cognition:  WNL  Sleep:      During the course of the hospitalization patient is doing well at this tiime despite his persistent substance us. He denies any withdrawal symptoms and has some improved insight by seeking detox and appears motivated to complete program. He is focused on his homelessness. His mood is euthymic and he denies any SI, HI or AVH on multiple occasions through his stay. He is future oriented and is looking to attending detox and then seeking rehab at residential facility. While this patient presents with risk factors that history of substance abuse, treatment non-compliance and chronic poor judgment, these are mitigated by protective factors which include no known access to weapons or firearms, presence of an available support system (his girlfriend) and patients agreement with treatment recommendations, safety plan and need for follow-up care.   Demographic Factors:  Male, Caucasian, Low socioeconomic status and Unemployed  Loss Factors: NA  Historical Factors: Impulsivity  Risk Reduction Factors:   Sense of responsibility to family, Religious beliefs about death, Positive social support, Positive therapeutic relationship and Positive coping skills or problem  solving skills  Continued Clinical Symptoms:  Depression:   Aggression Comorbid alcohol abuse/dependence Impulsivity Alcohol/Substance Abuse/Dependencies More than one psychiatric diagnosis  Cognitive Features That Contribute To Risk:  None    Suicide Risk:  Minimal: No identifiable suicidal ideation.  Patients presenting with no risk factors but with morbid ruminations; may be classified as minimal risk based on the severity of the depressive symptoms  Plan Of Care/Follow-up recommendations:  Other:  Psych cleared. Discharge to  Detox facility. Transporation provided.   Disposition: Psych cleared. Social Work consult placed and Peer Support Maryagnes Amos, FNP 09/18/2019, 5:08 PM

## 2019-09-18 NOTE — ED Notes (Signed)
Pt is stating that if he leaves he will overdose bc if he doesn't use he will start withdrawing. He states he has used fent, cocaine, heroine, meth nonstop for 5 days trying to kill himself.

## 2019-09-18 NOTE — ED Provider Notes (Signed)
Observed overnight. We were trying to facilitate placement for detox. At this point he would just like to leave. He is not under IVC. He feels like he can get the help he needs if he leaves and gets it on his own. Provided with resource list.    Raeford Razor, MD 09/18/19 1655

## 2019-09-18 NOTE — ED Notes (Signed)
Pt now in restroom at this time

## 2019-09-18 NOTE — ED Notes (Signed)
Pt was able to urinate once he got into the shower and UA and UDS was sent

## 2019-09-18 NOTE — ED Notes (Signed)
Lunch Tray Ordered @ 1044.  

## 2019-09-18 NOTE — Care Management (Signed)
Patient contracted for safety states he will not use or harm himself. He is going straight to see his girlfriend who will meet him at the designated spot. He has all his resources.

## 2019-09-18 NOTE — ED Notes (Signed)
Pt awaken c/o HA 8/10, pt will be medicated per [facility]

## 2019-09-18 NOTE — ED Notes (Signed)
Pt going to take a shower in hopes that the shower water will help him pee.

## 2019-09-18 NOTE — Progress Notes (Signed)
CSW contacted [facility] and [facility] regarding potential bed availability.  [facility] stated that they had no availability this date. Message was left with [facility] with contact information to follow up.  Katreena Schupp R. Treana Lacour, MSW, LCSW Clinical Social Work/Disposition Phone: [phone] Fax: [phone]  

## 2019-09-18 NOTE — ED Notes (Signed)
Pt making several phone calls, agitated that he doesn't have his discharge papers, this EMT stated as an EMT I cannot discharge him. Pt getting very ugly and told him we are doing him a favor by giving him a ride to get to a rehab/detox place. RN told and asked doctor/PA to put discharge order in.

## 2019-09-20 ENCOUNTER — Other Ambulatory Visit: Payer: Self-pay

## 2019-09-20 ENCOUNTER — Ambulatory Visit (HOSPITAL_COMMUNITY): Payer: No Payment, Other | Admitting: Licensed Clinical Social Worker

## 2019-09-22 ENCOUNTER — Telehealth: Payer: Self-pay | Admitting: *Deleted

## 2019-09-22 ENCOUNTER — Ambulatory Visit: Payer: Self-pay | Admitting: Physician Assistant

## 2019-09-22 ENCOUNTER — Encounter: Payer: Self-pay | Admitting: Pediatric Intensive Care

## 2019-09-22 ENCOUNTER — Other Ambulatory Visit: Payer: Self-pay

## 2019-09-22 ENCOUNTER — Telehealth (HOSPITAL_COMMUNITY): Payer: No Payment, Other | Admitting: Psychiatric/Mental Health

## 2019-09-22 VITALS — BP 138/84 | HR 58 | Temp 98.7°F | Resp 18 | Ht 65.0 in | Wt 190.0 lb

## 2019-09-22 DIAGNOSIS — S0181XA Laceration without foreign body of other part of head, initial encounter: Secondary | ICD-10-CM

## 2019-09-22 DIAGNOSIS — F332 Major depressive disorder, recurrent severe without psychotic features: Secondary | ICD-10-CM

## 2019-09-22 MED ORDER — DULOXETINE HCL 30 MG PO CPEP: 30.0000 mg | ORAL_CAPSULE | Freq: Every day | ORAL | 0 refills | Status: DC

## 2019-09-22 MED ORDER — IBUPROFEN 600 MG PO TABS: 600.0000 mg | ORAL_TABLET | Freq: Three times a day (TID) | ORAL | 0 refills | Status: DC | PRN

## 2019-09-22 MED ORDER — BUPROPION HCL ER (XL) 150 MG PO TB24: 150.0000 mg | ORAL_TABLET | Freq: Every day | ORAL | 0 refills | Status: DC

## 2019-09-22 MED ORDER — DOXYCYCLINE HYCLATE 100 MG PO CAPS: 100.0000 mg | ORAL_CAPSULE | Freq: Two times a day (BID) | ORAL | 0 refills | Status: DC

## 2019-09-22 MED FILL — DOXYCYCLINE HYCLATE 100 MG: 100 | 7 days supply | Qty: 14 | Fill #0

## 2019-09-22 MED FILL — IBUPROFEN 600 MG TABLET: 600 | 10 days supply | Qty: 30 | Fill #0

## 2019-09-22 MED FILL — DULoxetine HCL 30 MG CPEP: 30 | 30 days supply | Qty: 30 | Fill #0

## 2019-09-22 MED FILL — buPROPion HCL ER (XL) 150 M: 150 | 30 days supply | Qty: 30 | Fill #0

## 2019-09-22 NOTE — Telephone Encounter (Signed)
-----   Message from Tama High, RN sent at 09/22/2019  3:55 PM EDT ----- Regarding: Referral Hi friend- Saw this client at [facility] today. He desires suboxone tx. He is living at an [employer]. He has connected with outpatient therapy via [facility]. He's motivated to stop using so he can get back to work. His cell number is correct.   I sent him to check in on the mobile clinic this afternoon to have the PA eval a lac and some abscess sites.   Thanks!

## 2019-09-22 NOTE — Telephone Encounter (Signed)
Pt states he is at a doctor's appt now and his phone is about to die, ask nurse if she can call back tomorrow, will do so

## 2019-09-22 NOTE — Congregational Nurse Program (Signed)
  Dept: [phone]   Congregational Nurse Program Note  Date of Encounter: 09/22/2019  Past Medical History: Past Medical History:  Diagnosis Date  . Anxiety   . Arthritis   . Cellulitis 09/2018  . Chronic hepatitis C (HCC) 08/07/2019  . Depression   . Hepatitis    via pts mother  . 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 Nicotine dependence, cigarettes, uncomplicated 08/07/2019    Encounter Details: Initial encounter. Client desires suboxone treatment. He has a long history of IV heroine, fentanyl, methadone and cocaine use. He has had several recent ED visits most recently for a fall and he has a laceration on the right side of his forehead. He is currently unemployed so he has not been able to pick up antibiotic prescribed from ED visit. He has multiple old, healed abscess sites on his arms and legs. 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 He has previously been prescribed gabapentin 300mg  tid, wellbutrin 150mg  qday and cymbalta unknown dose. He has rescheduled his [facility] appointment for 8/13 and is motivated to keep the appointment. CN discussed OUD clinic referral process with client and will contact [facility] regarding the referral. Transportation provided to [facility] mobile clinic for evaluation of laceration. Return to [facility] clinic as needed. [facility] RN BSN CNP [phone]

## 2019-09-22 NOTE — Progress Notes (Signed)
New Patient Office Visit  Subjective:  Patient ID: Robert [name], male    DOB: [birth year]  Age: 39 y.o. MRN: [id number]  CC:  Chief Complaint  Patient presents with  . Wound Check    HPI Robert [name] was treated in the emergency department for a laceration on 09/15/2019.  Note from ED visit   Irrigated copiously due to creek water.  Given the creek exposure will cover with doxycycline to prevent wound infection.    No head injury on CT scan, wound closed and wound care instructions given.  Strict return precautions given.  Patient is clinically sober and PO challenged in the department and ambulated.  Stable for discharge with close follow up.    Patient was unable to pick up doxycycline due to financial constraints.  Reports that he was in the shower and removed the gauze patch over his wound yesterday and the glue came out on the patch.  Reports that he called the emergency department and they said that due to the length of time since injury stitches would not be appropriate.  Reports that he has had pain in the area, has been using ibuprofen and Tylenol with some relief.  Reports that he missed his behavioral health appointment, would like to get restarted on his behavioral health medications as soon as possible.  Reports he was previously taking Wellbutrin and Cymbalta.  Reports that he was previously taking gabapentin 300 mg 3 times a day for neuropathy, states that he has been using 800 mg 3 times a day because the 300 mg was not offering relief.  Reports he was getting this medication from his girlfriend.  Past Medical History:  Diagnosis Date  . Anxiety   . Arthritis   . Cellulitis 09/2018  . Chronic hepatitis C (HCC) 08/07/2019  . Depression   . Hepatitis    via pts mother  . 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 Nicotine dependence, cigarettes, uncomplicated 08/07/2019    Past Surgical History:  Procedure Laterality  Date  . GALLBLADDER SURGERY      Family History  Problem Relation Age of Onset  . Diabetes Mother   . Diabetes Maternal Grandfather     Social History   Socioeconomic History  . Marital status: Single    Spouse name: Not on file  . Number of children: Not on file  . Years of education: Not on file  . Highest education level: Not on file  Occupational History  . Not on file  Tobacco Use  . Smoking status: Current Every Day Smoker    Packs/day: 1.00    Types: Cigarettes  . Smokeless tobacco: Never Used  Vaping Use  . Vaping Use: Some days  Substance and Sexual Activity  . Alcohol use: Yes    Alcohol/week: 0.0 standard drinks  . Drug use: Yes    Types: Cocaine, Heroin, Methamphetamines  . Sexual activity: Not on file  Other Topics Concern  . Not on file  Social History Narrative  . Not on file   Social Determinants of Health   Financial Resource Strain:   . Difficulty of Paying Living Expenses:   Food Insecurity:   . Worried About Programme researcher, broadcasting/film/video in the Last Year:   . Barista in the Last Year:   Transportation Needs:   . Freight forwarder (Medical):   Marland Kitchen Lack of Transportation (Non-Medical):   Physical Activity:   . Days of Exercise per  Week:   . Minutes of Exercise per Session:   Stress:   . Feeling of Stress :   Social Connections:   . Frequency of Communication with Friends and Family:   . Frequency of Social Gatherings with Friends and Family:   . Attends Religious Services:   . Active Member of Clubs or Organizations:   . Attends Banker Meetings:   Marland Kitchen Marital Status:   Intimate Partner Violence:   . Fear of Current or Ex-Partner:   . Emotionally Abused:   Marland Kitchen Physically Abused:   . Sexually Abused:     ROS Review of Systems  Constitutional: Negative for chills and fever.  HENT: Negative.   Eyes: Negative.   Respiratory: Negative.   Cardiovascular: Negative.   Gastrointestinal: Negative.   Endocrine: Negative.     Genitourinary: Negative.   Musculoskeletal: Negative.   Skin: Positive for wound.  Allergic/Immunologic: Negative.   Neurological: Negative.   Hematological: Negative.   Psychiatric/Behavioral: Positive for dysphoric mood. Negative for self-injury and suicidal ideas. The patient is nervous/anxious.     Objective:   Today's Vitals: BP (!) 138/84 (BP Location: Left Arm, Patient Position: Sitting, Cuff Size: Normal)   Pulse 58   Temp 98.7 F (37.1 C) (Oral)   Resp 18   Ht 5\' 5"  (1.651 m)   Wt 190 lb (86.2 kg)   SpO2 100%   BMI 31.62 kg/m   Physical Exam Vitals and nursing note reviewed.  Constitutional:      General: He is not in acute distress.    Appearance: Normal appearance. He is not ill-appearing.  HENT:     Head: Normocephalic. Laceration present.      Right Ear: External ear normal.     Left Ear: External ear normal.     Nose: Nose normal.     Mouth/Throat:     Mouth: Mucous membranes are moist.     Pharynx: Oropharynx is clear.  Eyes:     Extraocular Movements: Extraocular movements intact.     Conjunctiva/sclera: Conjunctivae normal.     Pupils: Pupils are equal, round, and reactive to light.  Cardiovascular:     Rate and Rhythm: Normal rate and regular rhythm.     Pulses: Normal pulses.     Heart sounds: Normal heart sounds.  Pulmonary:     Effort: Pulmonary effort is normal.     Breath sounds: Normal breath sounds.  Abdominal:     General: Abdomen is flat. Bowel sounds are normal.     Palpations: Abdomen is soft.  Musculoskeletal:        General: Normal range of motion.     Cervical back: Normal range of motion and neck supple.  Neurological:     General: No focal deficit present.     Mental Status: He is alert and oriented to person, place, and time.  Psychiatric:        Mood and Affect: Mood normal.        Behavior: Behavior normal.        Thought Content: Thought content normal.        Judgment: Judgment normal.     Assessment & Plan:    Problem List Items Addressed This Visit      Other   MDD (major depressive disorder), recurrent severe, without psychosis (HCC) (Chronic)   Relevant Medications   buPROPion (WELLBUTRIN XL) 150 MG 24 hr tablet   DULoxetine (CYMBALTA) 30 MG capsule    Other Visit Diagnoses  Laceration of skin of forehead, initial encounter    -  Primary   Relevant Medications   doxycycline (VIBRAMYCIN) 100 MG capsule   ibuprofen (ADVIL) 600 MG tablet      Outpatient Encounter Medications as of 09/22/2019  Medication Sig  . buPROPion (WELLBUTRIN XL) 150 MG 24 hr tablet Take 1 tablet (150 mg total) by mouth daily.  . DULoxetine (CYMBALTA) 30 MG capsule Take 1 capsule (30 mg total) by mouth daily.  . [DISCONTINUED] buPROPion (WELLBUTRIN XL) 150 MG 24 hr tablet Take 1 tablet by mouth daily.  . [DISCONTINUED] DULoxetine (CYMBALTA) 30 MG capsule Take 1 capsule by mouth daily.  Marland Kitchen doxycycline (VIBRAMYCIN) 100 MG capsule Take 1 capsule (100 mg total) by mouth 2 (two) times daily.  . hydrOXYzine (ATARAX/VISTARIL) 25 MG tablet Take 1 tablet (25 mg total) by mouth every 6 (six) hours as needed for anxiety. (Patient not taking: Reported on 09/17/2019)  . ibuprofen (ADVIL) 600 MG tablet Take 1 tablet (600 mg total) by mouth every 8 (eight) hours as needed.  Marland Kitchen oxyCODONE (OXY IR/ROXICODONE) 5 MG immediate release tablet Take 1 tablet (5 mg total) by mouth every 4 (four) hours as needed for moderate pain or severe pain. (Patient not taking: Reported on 09/17/2019)  . pantoprazole (PROTONIX) 40 MG tablet Take 1 tablet (40 mg total) by mouth daily. (Patient not taking: Reported on 09/17/2019)  . [DISCONTINUED] amoxicillin-clavulanate (AUGMENTIN) 875-125 MG tablet Take 1 tablet by mouth every 12 (twelve) hours. (Patient not taking: Reported on 09/17/2019)  . [DISCONTINUED] doxycycline (VIBRAMYCIN) 100 MG capsule Take 1 capsule (100 mg total) by mouth 2 (two) times daily. One po bid x 7 days   No facility-administered  encounter medications on file as of 09/22/2019.  1. Laceration of skin of forehead, initial encounter Patient to return to mobile unit tomorrow for placement of Steri-Strips, no Steri-Strips on the unit today, antibiotic cream and small bandages were put in place to help protect the wound.  Medications sent to congregational nurse phone per [facility].  Patient is due to follow-up with internal medicine to begin medication assisted treatment and establish primary care.  - doxycycline (VIBRAMYCIN) 100 MG capsule; Take 1 capsule (100 mg total) by mouth 2 (two) times daily.  Dispense: 14 capsule; Refill: 0 - ibuprofen (ADVIL) 600 MG tablet; Take 1 tablet (600 mg total) by mouth every 8 (eight) hours as needed.  Dispense: 30 tablet; Refill: 0  2. MDD (major depressive disorder), recurrent severe, without psychosis (HCC) Resume previously prescribed medications - buPROPion (WELLBUTRIN XL) 150 MG 24 hr tablet; Take 1 tablet (150 mg total) by mouth daily.  Dispense: 30 tablet; Refill: 0 - DULoxetine (CYMBALTA) 30 MG capsule; Take 1 capsule (30 mg total) by mouth daily.  Dispense: 30 capsule; Refill: 0  On review of patient history, only prescription for gabapentin was 300 mg 3 times a day, this prescription was for 10 days to help patient with withdrawals.  Will defer prescription for gabapentin to his primary care provider.   I have reviewed the patient's medical history (PMH, PSH, Social History, Family History, Medications, and allergies) , and have been updated if relevant. I spent 30 minutes reviewing chart and  face to face time with patient.     Follow-up: Return in about 1 day (around 09/23/2019).   Kasandra Knudsen Mayers, PA-C

## 2019-09-22 NOTE — Patient Instructions (Signed)
Please follow-up with [place] with the congregational nurse program to find out if you will return to the mobile unit tomorrow for [place] to place Steri-Strips on your wound.  I sent your medications to the pharmacy, please follow-up with [place] on obtaining those medications.  Roney Jaffe, PA-C Physician Assistant [facility] [email or web address]   Wound Care, Adult Taking care of your wound properly can help to prevent pain, infection, and scarring. It can also help your wound to heal more quickly. How to care for your wound Wound care      Follow instructions from your health care provider about how to take care of your wound. Make sure you: ? Wash your hands with soap and water before you change the bandage (dressing). If soap and water are not available, use hand sanitizer. ? Change your dressing as told by your health care provider. ? Leave stitches (sutures), skin glue, or adhesive strips in place. These skin closures may need to stay in place for 2 weeks or longer. If adhesive strip edges start to loosen and curl up, you may trim the loose edges. Do not remove adhesive strips completely unless your health care provider tells you to do that.  Check your wound area every day for signs of infection. Check for: ? Redness, swelling, or pain. ? Fluid or blood. ? Warmth. ? Pus or a bad smell.  Ask your health care provider if you should clean the wound with mild soap and water. Doing this may include: ? Using a clean towel to pat the wound dry after cleaning it. Do not rub or scrub the wound. ? Applying a cream or ointment. Do this only as told by your health care provider. ? Covering the incision with a clean dressing.  Ask your health care provider when you can leave the wound uncovered.  Keep the dressing dry until your health care provider says it can be removed. Do not take baths, swim, use a hot tub, or do anything that  would put the wound underwater until your health care provider approves. Ask your health care provider if you can take showers. You may only be allowed to take sponge baths. Medicines   If you were prescribed an antibiotic medicine, cream, or ointment, take or use the antibiotic as told by your health care provider. Do not stop taking or using the antibiotic even if your condition improves.  Take over-the-counter and prescription medicines only as told by your health care provider. If you were prescribed pain medicine, take it 30 or more minutes before you do any wound care or as told by your health care provider. General instructions  Return to your normal activities as told by your health care provider. Ask your health care provider what activities are safe.  Do not scratch or pick at the wound.  Do not use any products that contain nicotine or tobacco, such as cigarettes and e-cigarettes. These may delay wound healing. If you need help quitting, ask your health care provider.  Keep all follow-up visits as told by your health care provider. This is important.  Eat a diet that includes protein, vitamin A, vitamin C, and other nutrient-rich foods to help the wound heal. ? Foods rich in protein include meat, dairy, beans, nuts, and other sources. ? Foods rich in vitamin A include carrots and dark green, leafy vegetables. ? Foods rich in vitamin C include citrus, tomatoes, and other fruits and vegetables. ? Nutrient-rich foods have protein, carbohydrates, fat,  vitamins, or minerals. Eat a variety of healthy foods including vegetables, fruits, and whole grains. Contact a health care provider if:  You received a tetanus shot and you have swelling, severe pain, redness, or bleeding at the injection site.  Your pain is not controlled with medicine.  You have redness, swelling, or pain around the wound.  You have fluid or blood coming from the wound.  Your wound feels warm to the  touch.  You have pus or a bad smell coming from the wound.  You have a fever or chills.  You are nauseous or you vomit.  You are dizzy. Get help right away if:  You have a red streak going away from your wound.  The edges of the wound open up and separate.  Your wound is bleeding, and the bleeding does not stop with gentle pressure.  You have a rash.  You faint.  You have trouble breathing. Summary  Always wash your hands with soap and water before changing your bandage (dressing).  To help with healing, eat foods that are rich in protein, vitamin A, vitamin C, and other nutrients.  Check your wound every day for signs of infection. Contact your health care provider if you suspect that your wound is infected. This information is not intended to replace advice given to you by your health care provider. Make sure you discuss any questions you have with your health care provider. Document Revised: 06/01/2018 Document Reviewed: 08/29/2015 Elsevier Patient Education  2020 [employer].

## 2019-09-23 ENCOUNTER — Ambulatory Visit: Payer: Self-pay | Admitting: *Deleted

## 2019-09-23 DIAGNOSIS — S0181XA Laceration without foreign body of other part of head, initial encounter: Secondary | ICD-10-CM

## 2019-09-23 NOTE — Progress Notes (Signed)
Patient tolerated placement of steri strips well today

## 2019-09-23 NOTE — Telephone Encounter (Signed)
Spoke w/ pt, he is very concerned and willing to come to OUD clinic He would like to be treated for HEPC and become a regular clinic pt because he needs a pcp  Using for appr 22 yrs When getting high he will use anything, Cocaine, crack, meth, fentanyl, heroin, pills, basically anything he can get, only uses alcohol when getting high. States"when I am getting high anything goes, when im clean im clean" States all family members have addiction problems. He is at [facility] now and is getting ready to go back to work. girfriend is in inpt rehab at this time  appt given for 8/3 at 1015 Do you agree?

## 2019-09-23 NOTE — Telephone Encounter (Signed)
Sounds appropriate. Thank you. 

## 2019-09-28 ENCOUNTER — Ambulatory Visit (INDEPENDENT_AMBULATORY_CARE_PROVIDER_SITE_OTHER): Payer: Self-pay | Admitting: Internal Medicine

## 2019-09-28 ENCOUNTER — Encounter: Payer: Self-pay | Admitting: Internal Medicine

## 2019-09-28 VITALS — BP 115/76 | HR 51 | Temp 98.6°F | Wt 186.2 lb

## 2019-09-28 DIAGNOSIS — B182 Chronic viral hepatitis C: Secondary | ICD-10-CM

## 2019-09-28 DIAGNOSIS — F1190 Opioid use, unspecified, uncomplicated: Secondary | ICD-10-CM

## 2019-09-28 DIAGNOSIS — F1199 Opioid use, unspecified with unspecified opioid-induced disorder: Secondary | ICD-10-CM

## 2019-09-28 MED ORDER — BUPRENORPHINE HCL-NALOXONE HCL 8-2 MG SL SUBL: 1.0000 | SUBLINGUAL_TABLET | Freq: Every day | SUBLINGUAL | 0 refills | Status: DC

## 2019-09-28 NOTE — Progress Notes (Signed)
09/28/2019  [name] presents for buprenorphine/naloxone intake visit.   I have reviewed EPIC data including labwork which was available.  I have reviewed outside records provided by patient if available.  The salient points were confirmed with the patient.    Review of substance use history (first use, substances used, any illicit purchases):  Mr. Tu states that he began illicit drug use approximately at the age of 83 with predominantly cocaine.  For the majority of his life, he used a combination of cocaine and methamphetamine, both injected and snorted.  Approximately 3 years ago, he began using IV and snorted heroin in an attempt to switch to something cheaper than cocaine and to alleviate some of his withdrawals from cocaine and meth.  Since that time, Mr. Andringa finds himself predominantly using IV heroin.  He states that he has attempted to quit in the past but relapses quite frequently.  He feels that his relapses tend to be triggered by depression and often times he treats his depression by using illicit substances.  He had been previously hesitant to start any MAT, but approximately 1 week ago he had a overdose for the first time in his life.  He feels like this really pushed him to want to quit and "stop feeling like this."  His OUD has significantly impaired his daily life, ability to work and relationships with family.  Mr. 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  In addition, he notices that his family and friends have been distancing themselves due to his worsening addiction.  He and his grandfather, with whom he was previously close, have been fighting a lot.  He was really moved by the fact that his grandfather bought him a grave plot due to his concerns over Mr. Brearley health.  No heroin use in 6 days.   Last substance used: Marijuana, Kratom   If last substance not an opioid, last opioid used (type,  dose, route, withdrawal symptoms): Marijuana, inhaled. Kratom, ingested.   Mental Health History:  Patient states he has been diagnosed with MDD and currently being managed with regular therapy, as well as Cymbalta and Wellbutrin. He feels his depression is still active but is hoping to make improvements as he works on his substance abuse problems. He denies any SI, HI.   Current counseling/behavioural health provider: Marybelle Killings @ [facility]  This patient has Opioid Use Disorder by following DSM-V criteria: Keep those that apply.  - Opioids taken in larger amounts or over a longer period than intended - Persistent desire to cut down - A great deal of time is spent to obtain/use/recover from the opioid - Cravings to use opioids - Use resulting in a failure to fulfill major role obligations - Continue opioid use despite persistent social or interpersonal problems - Important activities are given up or reduced because of opioid use - Recurrent opioid use in situations in which it is physically hazardous - Use despite knowledge of health problems caused by opioids - Tolerance - Withdrawal  Past Medical History:  Diagnosis Date  . Anxiety   . Arthritis   . Cellulitis 09/2018  . Chronic hepatitis C (HCC) 08/07/2019  . Depression   . Hepatitis    via pts mother  . 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 Nicotine dependence, cigarettes, uncomplicated 08/07/2019    Current Outpatient Medications on File Prior to Visit  Medication  Sig Dispense Refill  . buPROPion (WELLBUTRIN XL) 150 MG 24 hr tablet Take 1 tablet (150 mg total) by mouth daily. 30 tablet 0  . doxycycline (VIBRAMYCIN) 100 MG capsule Take 1 capsule (100 mg total) by mouth 2 (two) times daily. 14 capsule 0  . DULoxetine (CYMBALTA) 30 MG capsule Take 1 capsule (30 mg total) by mouth daily. 30 capsule 0  . hydrOXYzine (ATARAX/VISTARIL) 25 MG tablet Take 1 tablet (25 mg total) by mouth  every 6 (six) hours as needed for anxiety. (Patient not taking: Reported on 09/17/2019) 30 tablet 0  . ibuprofen (ADVIL) 600 MG tablet Take 1 tablet (600 mg total) by mouth every 8 (eight) hours as needed. 30 tablet 0  . oxyCODONE (OXY IR/ROXICODONE) 5 MG immediate release tablet Take 1 tablet (5 mg total) by mouth every 4 (four) hours as needed for moderate pain or severe pain. (Patient not taking: Reported on 09/17/2019) 6 tablet 0  . pantoprazole (PROTONIX) 40 MG tablet Take 1 tablet (40 mg total) by mouth daily. (Patient not taking: Reported on 09/17/2019) 30 tablet 0   No current facility-administered medications on file prior to visit.   Physical Exam  Vitals:   09/28/19 1058  BP: 115/76  Pulse: (!) 49  Temp: 98.6 F (37 C)  TempSrc: Oral  SpO2: 99%  Weight: 186 lb 3.2 oz (84.5 kg)    Clinical Opiate Withdrawal Scale: bold applicable COWS scoring   - Resting HR:    - 0 for < 80   - 1 for 81 - 100   - 2 for 101 - 120   - 4 for > 120  - Sweating:   - 0 for no chills/flushing   - 1 for subjective chills/flushing   - 3 for beads of sweat on brow/face   - 4 for sweat streaming off of face  - Restlessness:    - 0 for able to sit still   - 1 for subjective difficulty sitting still   - 3 for frequent shifting or extraneous movement   - 5 for unable to sit still for more than a few seconds  - Pupil size:    - 0 for pinpoint or normal   - 1 for possibly larger than normal   - 2 for moderately dilated   - 5 for only iris rim visible  - Bone/joint pain:    - 0 for not present   - 1 for mild diffuse discomfort   - 2 severe diffuse aching   - 4 for objectively rubbing joints/muscles and obviously in pain  - Runny nose/tearing:    - 0 for not present   - 1 for stuffy nose/moist eyes   - 2 for nose running/tearing   - 4 for nose constantly running or tears streaming down cheeks  - GI Upset:    - 0 for no GI symptoms   - 1 for stomach cramps   - 2 for nausea or loose  stool   - 3 for vomiting or diarrhea   - 5 for multiple episodes of vomiting or diarrhea  - Tremor observation of outstretched hands:    - 0 for no tremor   - 1 for tremor can be felt but not observed   - 2 for slight tremor observable   - 4 for gross tremor or muscle twitching  - Yawning:    - 0 for no yawning   - 1 for yawning once or twice during assessment   -  2 for yawning three or more times during assessment   - 4 for yawning several times per minute  - Anxiety or irritability:    - 0 for none   - 1 for patient reports increasing irritability or anxiousness   - 2 for patient obviously irritable/anxious   - 4 for patient so irritable/anxious that assessment is difficult  - Gooseflesh:    - 0 for skin is smooth   - 3 for piloerection of skin can be felt or seen   - 5 for prominent piloerection  TOTAL: 0  Assessment/Plan:   Based on a review of the patient's medical history including substance use and mental health factors, and physical exam, Arlo Butt is a suitable candidate for MAT with buprenorphine/naloxone.  UDS ordered this visit.    I have discussed HIV and Hepatitis C screening with this patient.    I will place orders for CMET for baseline LFT evaluation if needed.    Home Induction:   I have instructed the patient how to appropriately take this medication, including placing under the tongue with head relaxed for 10 minutes and allowing to dissolve without chewing or swallowing tab/film, and with nothing to eat or drink in the subsequent 15 minutes.  They have been told not to start taking the medication until they have significant signs of withdrawal and I have explained the concept of precipitated withdrawal with the patient.    Intervisit Care:  We discussed this medication must be kept in a safe place and away from children.   We will see the patient back in 1 week in clinic, with options for a sooner appointment based on patient and provider preference.    Patient was encouraged to call the office and speak with the MD on call for any urgent concerns.    Verdene Lennert, MD 09/28/2019 10:59 AM

## 2019-09-28 NOTE — Patient Instructions (Addendum)
It was nice seeing you today! Thank you for choosing [facility].    Today we talked about:   1. Starting Suboxone: We sent in a prescription to the pharmacy for tablets. Take 1 per day. When you arrive at the pharmacy, ask about the films and see if they are available.  2. I placed a referral to the Infectious Disease specialists to further discuss starting treatment for Hepatitis C 3. Lab work today: HIV screening   ---------------------------------------------------------------------------------------------------------------------------------------------------- Instruction for starting buprenorphine-naloxone (Suboxone) at home  You should not mix buprenorphine-naloxone with other drugs especially large amounts of alcohol or benzodiazepines (Valium, Klonopin, Xanax, Ativan). If you have taken any of these medication, please tell your healthcare team and do not take buprenorphine-naloxone.   You must wait until you are feeling signs of withdrawal from opiates (heroin, pain pills) before you take buprenorphine-naloxone.  If you do not wait long enough the medication will make you sicker.  If you do take it too soon and get sicker then wait until later when you feel signs of withdrawal listed below and then try again.   Signs that you are withdrawing: ? Anxiety, restlessness, can't sit still ? Aches ? Nausea or sick to your stomach ? Goose-bumps ? Racing heart   You should have ALL of these symptoms before you start taking your first dose of buprenorphine-naloxone. If you are not sure call your healthcare team.    When it's time to take your first dose 1. Split your pill or film in half 2. Make sure your mouth is empty of everything (no candy/gum/etc) 3. Sit or stand, but do not lie down 4. Swallow a sip of water to wet your mouth  5. Put the half of the tablet or film under your tongue. Do not suck or swallow it. It must stay there until it is completely dissolved. Try to not  even swallow your spit during this time. Anything that you swallow will not make you feel better.   In 20 minutes: You should start feeling a little better. If you feel worse then you started too early so you would wait a few hours and then try again later.    In one hour: You can take the other half of the pill or film the same way you took the first one.   In 2 hours: if you are still feeling symptoms of withdrawal listed above you can take another half a pill or film. You can repeat this if needed until you take a total of 2 pills or 2 films (16mg ). You may need less than this to control your symptoms.  You should adjust your dose so that you are taking one and half or two pills or films per day (12-16mg  per day). At this dose you should have cut down on cravings and help with any withdrawal symptoms.   The next day:  In the morning you can take the same amount you took yesterday all at one time in the morning.  Expect a call from your team to see how you are doing.   If you have any questions or concerns at any time call your healthcare team.  Clinic Number:  830am - 5pm: [phone] After Hours Number: [phone] - - Leave your number and expect a call back from a physician.

## 2019-09-28 NOTE — Assessment & Plan Note (Signed)
Robert [name] presents today to to our clinic for initiation of Suboxone.  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  In the past 3 years though, his main addiction has focused on heroin.  After an overdose 1 week ago, he feels ready to start treatment.  Last use is approximately 6 days ago prior to his overdose. He denies any heroin withdrawal symptoms at this time.  Assessment/plan: Severe opioid use disorder warranting MAT.  We will begin with Suboxone 8-2 mg once per day and escalate as needed to manage cravings.    -Suboxone 8-2 mg once per day, number 7 tablets sent to pharmacy -Follow-up in 1 week -UDS pending -Signed contract at next visit

## 2019-09-28 NOTE — Assessment & Plan Note (Signed)
Robert [name] states that he has had chronic hepatitis C for approximately 1 to 2 years.  He is unsure where he contracted it, but assumes is from sharing needles with others.  Since contracting it, he has been told that he has elevated liver enzymes, but denies any abdominal pain, abdominal distention, changes in skin.  He has not followed up with ID yet.  Assessment/plan: Per chart review, LFTs were obtained approximately 2 weeks ago and show moderate elevations in LFTs with ALT around 400 and AST around 200.  Patient would benefit from treatment of his hepatitis C.  Patient states his girlfriend is also positive, and encouraged that she pursue treatment as well to avoid reinfecting each other later in the future.  -Referral to [facility] -Hep C RNA genotype pending -Hep C RNA quant pending

## 2019-09-29 ENCOUNTER — Encounter: Payer: Self-pay | Admitting: Pediatric Intensive Care

## 2019-09-29 NOTE — Progress Notes (Signed)
Internal Medicine Clinic Attending  I saw and evaluated the patient.  I personally confirmed the key portions of the history and exam documented by Dr. Basaraba and I reviewed pertinent patient test results.  The assessment, diagnosis, and plan were formulated together and I agree with the documentation in the resident's note.    

## 2019-09-29 NOTE — Congregational Nurse Program (Signed)
  Dept: [place] Nurse Program Note  Date of Encounter: 09/29/2019  Past Medical History: Past Medical History:  Diagnosis Date  . Anxiety   . Arthritis   . Cellulitis 09/2018  . Cellulitis and abscess of upper extremity 02/28/2018  . Cellulitis of hand, right 08/07/2019  . Cellulitis of leg, right   . Cellulitis of right hand 08/08/2019  . Chronic hepatitis C ([place]) 08/07/2019  . Depression   . Hepatitis    via pts mother  . 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 Nicotine dependence, cigarettes, uncomplicated 07/31/7701  . Right arm cellulitis 08/07/2019    Encounter Details:Met client to deliver suboxone. He states he is feeling well. He will need update on scheduled appointments. He is following up with [facility] provider at [facility]. CN encouraged client to stop by [place] clinic for support. Lisette Abu RN BSN CNP [phone]

## 2019-10-01 ENCOUNTER — Telehealth: Payer: Self-pay | Admitting: Internal Medicine

## 2019-10-01 LAB — TOXASSURE SELECT,+ANTIDEPR,UR

## 2019-10-01 MED ORDER — BUPRENORPHINE HCL-NALOXONE HCL 8-2 MG SL SUBL: 1.0000 | SUBLINGUAL_TABLET | Freq: Every day | SUBLINGUAL | 0 refills | Status: DC

## 2019-10-01 NOTE — Telephone Encounter (Signed)
Pt requesting a call back -  Refill Request  buprenorphine-naloxone (SUBOXONE) 8-2 mg SUBL SL tablet   [facility] - [place], [place] - [address]

## 2019-10-01 NOTE — Telephone Encounter (Signed)
Pt states dr Criselda Peaches told him she would call in more if he needed to go to 2 daily of suboxone

## 2019-10-01 NOTE — Telephone Encounter (Signed)
Refill sent.

## 2019-10-02 LAB — HEPATITIS C GENOTYPE: Hepatitis C Genotype: 3

## 2019-10-02 LAB — HCV RNA QUANT
HCV log10: 6.093 log10 IU/mL
Hepatitis C Quantitation: 1240000 IU/mL

## 2019-10-02 LAB — HIV ANTIBODY (ROUTINE TESTING W REFLEX): HIV Screen 4th Generation wRfx: NONREACTIVE

## 2019-10-04 ENCOUNTER — Ambulatory Visit (HOSPITAL_COMMUNITY): Payer: Self-pay | Admitting: Licensed Clinical Social Worker

## 2019-10-04 ENCOUNTER — Other Ambulatory Visit: Payer: Self-pay

## 2019-10-04 ENCOUNTER — Telehealth (HOSPITAL_COMMUNITY): Payer: Self-pay | Admitting: Licensed Clinical Social Worker

## 2019-10-04 NOTE — Telephone Encounter (Signed)
LCSW sent link for virtual video session this morning for scheduled appt. There was no response. LCSW phoned pt, call went to vm. Left message regarding appt with phone number for call back.

## 2019-10-05 ENCOUNTER — Telehealth: Payer: Self-pay | Admitting: Pharmacy Technician

## 2019-10-05 ENCOUNTER — Encounter: Payer: Self-pay | Admitting: Pediatric Intensive Care

## 2019-10-05 ENCOUNTER — Telehealth: Payer: Self-pay | Admitting: Pediatric Intensive Care

## 2019-10-05 ENCOUNTER — Other Ambulatory Visit: Payer: Self-pay | Admitting: *Deleted

## 2019-10-05 ENCOUNTER — Telehealth: Payer: Self-pay | Admitting: *Deleted

## 2019-10-05 NOTE — Telephone Encounter (Signed)
Call to client- ID x2. CN advised that she will bring voucher form for client to sign.  Shann Medal RN BSN CNP [phone]

## 2019-10-05 NOTE — Telephone Encounter (Signed)
Call to [facility] to discuss filling suboxone on CNP funds. Pharmacist said that medication was slated to go out on delivery this afternoon. Would be self-pay. CN stated that program can pay for medication. She will have client sign voucher and fax back. Shann Medal RN BSN CNP [phone]

## 2019-10-05 NOTE — Telephone Encounter (Signed)
[facility] Patient Advocate Encounter ° °Insurance verification completed.   ° °The patient is uninsured and will need patient assistance for medication. ° °We can complete the application and will need to meet with the patient for signatures and income documentation. ° °Deone Omahoney E. Jnai Snellgrove, CPhT °Specialty Pharmacy Patient Advocate °[facility] °Phone: [phone] °Fax:  [phone] ° ° °

## 2019-10-05 NOTE — Telephone Encounter (Signed)
He already received a 2nd Rx to make it to appointment today.  If there is room, you can get him this week while Dr. Mikey Bussing, [facility] or myself are in clinic for a thorough evaluation and then keep his appointment next Tuesday.  No further over phone refills until he shows for an appointment.   Debe Coder, MD

## 2019-10-05 NOTE — Telephone Encounter (Signed)
duplicate

## 2019-10-05 NOTE — Congregational Nurse Program (Signed)
  Dept: [place] Nurse Program Note  Date of Encounter: 10/05/2019  Past Medical History: Past Medical History:  Diagnosis Date  . Anxiety   . Arthritis   . Cellulitis 09/2018  . Cellulitis and abscess of upper extremity 02/28/2018  . Cellulitis of hand, right 08/07/2019  . Cellulitis of leg, right   . Cellulitis of right hand 08/08/2019  . Chronic hepatitis C ([place]) 08/07/2019  . Depression   . Hepatitis    via pts mother  . MDD (major depressive disorder), recurrent severe, without psychosis ([place]) 03/09/2017  . Nicotine dependence, cigarettes, uncomplicated 06/30/1831  . Right arm cellulitis 08/07/2019    Encounter Details: Met client at [place] to have him sign voucher for [employer]. Client states that he is sorry that he missed his appointment and that he is committed to "staying clean". CN states it is important for him to keep track of appointments and that CN can assist with transportation and medication. But he must go to appointments, check in with [place] clinic and notify CN of transportation/medication needs. CN also stressed importance of follow through with [facility] counseling at [facility].  Lisette Abu Rn BSN CNP [phone]

## 2019-10-05 NOTE — Telephone Encounter (Signed)
Called pt to discuss missed appt this am and the importance of showing up and doing as ask. He gave several reasons for missing appt, no sleep due to dental pain, having skeletal pain. No one answering ph in clinic. Thinking no one cared about him showing up.   nurse went over expectations of continued care in OUD clinic and call notice if needing to miss an appt. He states hes doing ok on 1 strip per day. Scheduled for 8/17 at 0915 OUD.

## 2019-10-05 NOTE — Telephone Encounter (Signed)
Call to client. ID x2. Client states that he called Internal Medicine clinic and that they sent his suboxone prescription to [facility]. He states he missed his appointment this morning because he is having dental issues. CN advised client that he can go to [facility] to be evaluated for this issue but client declined for now. CN will contact [facility] regarding the suboxone prescription. Shann Medal RN BSN CNP [phone]

## 2019-10-05 NOTE — Telephone Encounter (Signed)
Spoke w/ victoria.

## 2019-10-05 NOTE — Telephone Encounter (Signed)
[facility] Patient Advocate Encounter ° °Insurance verification completed.   ° °The patient is uninsured and will need patient assistance for medication. ° °We can complete the application and will need to meet with the patient for signatures and income documentation. ° °Betty E. [place], CPhT °Specialty Pharmacy Patient Advocate °[facility] °Phone: [phone] °Fax:  [phone] ° ° °

## 2019-10-05 NOTE — Telephone Encounter (Signed)
Agree with this plan.

## 2019-10-06 ENCOUNTER — Other Ambulatory Visit: Payer: Self-pay

## 2019-10-06 ENCOUNTER — Encounter: Payer: Self-pay | Admitting: Pediatric Intensive Care

## 2019-10-06 ENCOUNTER — Encounter: Payer: Self-pay | Admitting: Internal Medicine

## 2019-10-06 ENCOUNTER — Ambulatory Visit (INDEPENDENT_AMBULATORY_CARE_PROVIDER_SITE_OTHER): Payer: Self-pay | Admitting: Internal Medicine

## 2019-10-06 VITALS — BP 129/84 | HR 55 | Temp 98.0°F | Ht 65.0 in | Wt 187.0 lb

## 2019-10-06 DIAGNOSIS — F1320 Sedative, hypnotic or anxiolytic dependence, uncomplicated: Secondary | ICD-10-CM

## 2019-10-06 DIAGNOSIS — F1190 Opioid use, unspecified, uncomplicated: Secondary | ICD-10-CM

## 2019-10-06 DIAGNOSIS — F1199 Opioid use, unspecified with unspecified opioid-induced disorder: Secondary | ICD-10-CM

## 2019-10-06 MED ORDER — BUPRENORPHINE HCL-NALOXONE HCL 8-2 MG SL SUBL: 1.0000 | SUBLINGUAL_TABLET | Freq: Two times a day (BID) | SUBLINGUAL | 0 refills | Status: DC

## 2019-10-06 NOTE — Congregational Nurse Program (Signed)
  Dept: [phone]   Congregational Nurse Program Note  Date of Encounter: 10/06/2019  Past Medical History: Past Medical History:  Diagnosis Date  . Anxiety   . Arthritis   . Cellulitis 09/2018  . Cellulitis and abscess of upper extremity 02/28/2018  . Cellulitis of hand, right 08/07/2019  . Cellulitis of leg, right   . Cellulitis of right hand 08/08/2019  . Chronic hepatitis C ([facility]) 08/07/2019  . Depression   . Hepatitis    via pts mother  . 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 Nicotine dependence, cigarettes, uncomplicated 08/07/2019  . Right arm cellulitis 08/07/2019    Encounter Details:Client encounter to deliver new prescription for suboxone. CN advised client to take only as prescribed and not to divert. Client states that he won't and thanks CN for her assistance. He is aware of transportation for [facility] appointment tomorrow. Shann Medal Rn BSN CNP [phone]

## 2019-10-06 NOTE — Patient Instructions (Addendum)
Thank you for allowing [place] to provide your care today.  We send refill for Suboxone for you. Place 1 tablet under your tongue twice a day (in the morning and the bedtime).  Make sure to come back to our clinic (OUD clinic) on Tuesday.     Take rest of your medications as before. As always, if having severe symptoms, please seek medical attention at emergency room. Should you have any questions or concerns please call the internal medicine clinic at [phone].    Thank you!

## 2019-10-06 NOTE — Progress Notes (Signed)
   CC: Follow-up of opioid use disorder  HPI:  Mr.Erby Curci is a 39 y.o. male with chronic hep C, MDD, opiate use disorder (IV cocaine use), substance induced mood disorder presented for follow-up and getting Suboxone refill. Please refer to problem based charting for further details and assessment and plan of current problem and chronic medical conditions.   Medications: Suboxone 8-2 mg sublingual tablet once a day, bupropion 150 mg daily, Cymbalta, 30 mg daily  Past Medical History:  Diagnosis Date  . Anxiety   . Arthritis   . Cellulitis 09/2018  . Cellulitis and abscess of upper extremity 02/28/2018  . Cellulitis of hand, right 08/07/2019  . Cellulitis of leg, right   . Cellulitis of right hand 08/08/2019  . Chronic hepatitis C ([facility]) 08/07/2019  . Depression   . Hepatitis    via pts mother  . 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 Nicotine dependence, cigarettes, uncomplicated 08/07/2019  . Right arm cellulitis 08/07/2019   Review of Systems:  Constitutional: Negative for chills and fever.  Respiratory: Negative for shortness of breath.   Cardiovascular: Negative for chest pain and leg swelling.  Gastrointestinal: Negative for abdominal pain, nausea and vomiting.  Neurological: Negative for dizziness and headaches. Positive for neuropathic pain.  Physical Exam:  Vitals:   10/06/19 1323  BP: 129/84  Pulse: (!) 55  Temp: 98 F (36.7 C)  TempSrc: Oral  SpO2: 97%  Weight: 187 lb (84.8 kg)  Height: 5\' 5"  (1.651 m)   Constitutional: Well-developed and well-nourished. No acute distress.  HENT:  Head: Normocephalic and atraumatic.  Eyes: Conjunctivae are normal, EOM nl Cardiovascular:  RRR, nl S1S2, no murmur,  no LEE Respiratory: Effort normal and breath sounds normal. No respiratory distress. No wheezes.  GI: Soft. Bowel sounds are normal. No distension. There is no tenderness.  Neurological: Is alert and oriented x 3   Skin: Not diaphoretic. No erythema, prior injection marks are present. Psychiatric: Normal mood and affect. Behavior is normal. Judgment and thought content normal.   Assessment & Plan:   See Encounters Tab for problem based charting.  Patient discussed with Dr. 

## 2019-10-07 ENCOUNTER — Telehealth (HOSPITAL_COMMUNITY): Payer: No Payment, Other

## 2019-10-07 ENCOUNTER — Other Ambulatory Visit: Payer: Self-pay

## 2019-10-07 ENCOUNTER — Ambulatory Visit (INDEPENDENT_AMBULATORY_CARE_PROVIDER_SITE_OTHER): Payer: Self-pay | Admitting: Family

## 2019-10-07 ENCOUNTER — Telehealth: Payer: Self-pay | Admitting: Pharmacy Technician

## 2019-10-07 ENCOUNTER — Encounter: Payer: Self-pay | Admitting: Family

## 2019-10-07 VITALS — BP 128/79 | HR 59 | Temp 97.9°F | Wt 181.0 lb

## 2019-10-07 DIAGNOSIS — B182 Chronic viral hepatitis C: Secondary | ICD-10-CM

## 2019-10-07 DIAGNOSIS — F1910 Other psychoactive substance abuse, uncomplicated: Secondary | ICD-10-CM

## 2019-10-07 NOTE — Telephone Encounter (Signed)
[facility] Patient Advocate Encounter  Gathered needed applications and signatures for future Hepatitis C medication assistance.  Netty Starring. Dimas Aguas CPhT Specialty Pharmacy Patient [facility] Phone: [phone] Fax:  [phone]

## 2019-10-07 NOTE — Assessment & Plan Note (Signed)
Robert [name] has remained sober for approximately 2 weeks now from cocaine, methamphetamine, and heroin use.  Encouraged to continue with abstinence with support through counseling as needed.  Discussed risk of possible reinfection and risk of treatment failure with IV drug use.  Congratulated on current accomplishments.  Continue to monitor.

## 2019-10-07 NOTE — Patient Instructions (Signed)
Nice to meet you.  We will check your liver function today.   Plan for follow up pending blood work results.   Limit acetaminophen (Tylenol) usage to no more than 2 grams (2,000 mg) per day.  Avoid alcohol.  Do not share toothbrushes or razors.  Practice safe sex to protect against transmission as well as sexually transmitted disease.    Hepatitis C Hepatitis C is a viral infection of the liver. It can lead to scarring of the liver (cirrhosis), liver failure, or liver cancer. Hepatitis C may go undetected for months or years because people with the infection may not have symptoms, or they may have only mild symptoms. What are the causes? This condition is caused by the hepatitis C virus (HCV). The virus can spread from person to person (is contagious) through:  Blood.  Childbirth. A woman who has hepatitis C can pass it to her baby during birth.  Bodily fluids, such as breast milk, tears, semen, vaginal fluids, and saliva.  Blood transfusions or organ transplants done in the [place] before 1992.  What increases the risk? The following factors may make you more likely to develop this condition:  Having contact with unclean (contaminated) needles or syringes. This may result from: ? Acupuncture. ? Tattoing. ? Body piercing. ? Injecting drugs.  Having unprotected sex with someone who is infected.  Needing treatment to filter your blood (kidney dialysis).  Having HIV (human immunodeficiency virus) or AIDS (acquired immunodeficiency syndrome).  Working in a job that involves contact with blood or bodily fluids, such as health care.  What are the signs or symptoms? Symptoms of this condition include:  Fatigue.  Loss of appetite.  Nausea.  Vomiting.  Abdominal pain.  Dark yellow urine.  Yellowish skin and eyes (jaundice).  Itchy skin.  Clay-colored bowel movements.  Joint pain.  Bleeding and bruising easily.  Fluid building up in your stomach  (ascites).  In some cases, you may not have any symptoms. How is this diagnosed? This condition is diagnosed with:  Blood tests.  Other tests to check how well your liver is functioning. They may include: ? Magnetic resonance elastography (MRE). This imaging test uses MRIs and sound waves to measure liver stiffness. ? Transient elastography. This imaging test uses ultrasounds to measure liver stiffness. ? Liver biopsy. This test requires taking a small tissue sample from your liver to examine it under a microscope.  How is this treated? Your health care provider may perform noninvasive tests or a liver biopsy to help decide the best course of treatment. Treatment may include:  Antiviral medicines and other medicines.  Follow-up treatments every 6-12 months for infections or other liver conditions.  Receiving a donated liver (liver transplant).  Follow these instructions at home: Medicines  Take over-the-counter and prescription medicines only as told by your health care provider.  Take your antiviral medicine as told by your health care provider. Do not stop taking the antiviral even if you start to feel better.  Do not take any medicines unless approved by your health care provider, including over-the-counter medicines and birth control pills. Activity  Rest as needed.  Do not have sex unless approved by your health care provider.  Ask your health care provider when you may return to school or work. Eating and drinking  Eat a balanced diet with plenty of fruits and vegetables, whole grains, and lowfat (lean) meats or non-meat proteins (such as beans or tofu).  Drink enough fluids to keep your urine  clear or pale yellow.  Do not drink alcohol. General instructions  Do not share toothbrushes, nail clippers, or razors.  Wash your hands frequently with soap and water. If soap and water are not available, use hand sanitizer.  Cover any cuts or open sores on your skin to  prevent spreading the virus.  Keep all follow-up visits as told by your health care provider. This is important. You may need follow-up visits every 6-12 months. How is this prevented? There is no vaccine for hepatitis C. The only way to prevent the disease is to reduce the risk of exposure to the virus. Make sure you:  Wash your hands frequently with soap and water. If soap and water are not available, use hand sanitizer.  Do not share needles or syringes.  Practice safe sex and use condoms.  Avoid handling blood or bodily fluids without gloves or other protection.  Avoid getting tattoos or piercings in shops or other locations that are not clean.  Contact a health care provider if:  You have a fever.  You develop abdominal pain.  You pass dark urine.  You pass clay-colored stools.  You develop joint pain. Get help right away if:  You have increasing fatigue or weakness.  You lose your appetite.  You cannot eat or drink without vomiting.  You develop jaundice or your jaundice gets worse.  You bruise or bleed easily. Summary  Hepatitis C is a viral infection of the liver. It can lead to scarring of the liver (cirrhosis), liver failure, or liver cancer.  The hepatitis C virus (HCV) causes this condition. The virus can pass from person to person (is contagious).  You should not take any medicines unless approved by your health care provider. This includes over-the-counter medicines and birth control pills. This information is not intended to replace advice given to you by your health care provider. Make sure you discuss any questions you have with your health care provider. Document Released: 02/09/2000 Document Revised: 03/19/2016 Document Reviewed: 03/19/2016 Elsevier Interactive Patient Education  [employer].

## 2019-10-07 NOTE — Assessment & Plan Note (Signed)
Patient presented today for follow up of OUD and to refill Suboxone. At the and of this encounter, he asks to (re)start him on gabapentin for his neuropathy. 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

## 2019-10-07 NOTE — Progress Notes (Signed)
Subjective:    Patient ID: Robert [name], male    DOB: [birth year], 39 y.o.   MRN: [id number]  Chief Complaint  Patient presents with  . Hepatitis C    HPI:  Robert [name] is a 39 y.o. male with previous medical history significant for intravenous drug use, alcoholism, major depressive disorder, and opioid use disorder presenting today for initial treatment and evaluation of hepatitis C.  Robert [name] was initially diagnosed with hepatitis C approximately 2 years ago when he was hospitalized for an episode of cellulitis.  Risk factor for acquiring hepatitis C includes injection drug use.  No previous history of blood transfusions prior to 1992, sharing of toothbrushes/razors, or sexual contact with known positive partners.  No personal or family history of liver disease.  He has not received treatment to date.  No current symptoms including abdominal pain, nausea, vomiting, scleral icterus, or jaundice.  He does have some fatigue on occasion.  He has been sober from IV drug use for approximately 2 weeks now.  No current alcohol consumption.  Continues to smoke cigarettes at 1/3 pack/day and also vape every few days.  Most recent blood work reviewed from 09/28/2019 with genotype 3, RNA level of 1.24 million and negative HIV and hepatitis B testing.   Allergies  Allergen Reactions  . Hydrocodone     nausea      Outpatient Medications Prior to Visit  Medication Sig Dispense Refill  . buprenorphine-naloxone (SUBOXONE) 8-2 mg SUBL SL tablet Place 1 tablet under the tongue in the morning and at bedtime. 14 tablet 0  . buPROPion (WELLBUTRIN XL) 150 MG 24 hr tablet Take 1 tablet (150 mg total) by mouth daily. 30 tablet 0  . DULoxetine (CYMBALTA) 30 MG capsule Take 1 capsule (30 mg total) by mouth daily. 30 capsule 0  . ibuprofen (ADVIL) 600 MG tablet Take 1 tablet (600 mg total) by mouth every 8 (eight) hours as needed. 30 tablet 0  . doxycycline (VIBRAMYCIN) 100 MG capsule Take 1 capsule (100 mg total)  by mouth 2 (two) times daily. (Patient not taking: Reported on 10/07/2019) 14 capsule 0   No facility-administered medications prior to visit.     Past Medical History:  Diagnosis Date  . Anxiety   . Arthritis   . Cellulitis 09/2018  . Cellulitis and abscess of upper extremity 02/28/2018  . Cellulitis of hand, right 08/07/2019  . Cellulitis of leg, right   . Cellulitis of right hand 08/08/2019  . Chronic hepatitis C ([place]) 08/07/2019  . Depression   . Hepatitis    via pts mother  . MDD (major depressive disorder), recurrent severe, without psychosis ([place]) 03/09/2017  . Nicotine dependence, cigarettes, uncomplicated 08/21/348  . Right arm cellulitis 08/07/2019      Past Surgical History:  Procedure Laterality Date  . GALLBLADDER SURGERY        Family History  Problem Relation Age of Onset  . Diabetes Mother   . Diabetes Maternal Grandfather       Social History   Socioeconomic History  . Marital status: Single    Spouse name: Not on file  . Number of children: Not on file  . Years of education: Not on file  . Highest education level: Not on file  Occupational History  . Not on file  Tobacco Use  . Smoking status: Current Every Day Smoker    Packs/day: 0.30    Types: Cigarettes, E-cigarettes  . Smokeless tobacco: Never Used  Vaping Use  .  Vaping Use: Some days  Substance and Sexual Activity  . Alcohol use: Not Currently    Alcohol/week: 0.0 standard drinks  . Drug use: Not Currently    Types: Cocaine, Heroin, Methamphetamines    Comment: Sober x weeks   . Sexual activity: Not on file  Other Topics Concern  . Not on file  Social History Narrative  . Not on file   Social Determinants of Health   Financial Resource Strain:   . Difficulty of Paying Living Expenses:   Food Insecurity:   . Worried About Charity fundraiser in the Last Year:   . Arboriculturist in the Last Year:   Transportation Needs:   . Film/video editor (Medical):   Marland Kitchen Lack of  Transportation (Non-Medical):   Physical Activity:   . Days of Exercise per Week:   . Minutes of Exercise per Session:   Stress:   . Feeling of Stress :   Social Connections:   . Frequency of Communication with Friends and Family:   . Frequency of Social Gatherings with Friends and Family:   . Attends Religious Services:   . Active Member of Clubs or Organizations:   . Attends Archivist Meetings:   Marland Kitchen Marital Status:   Intimate Partner Violence:   . Fear of Current or Ex-Partner:   . Emotionally Abused:   Marland Kitchen Physically Abused:   . Sexually Abused:       Review of Systems  Constitutional: Negative for chills, diaphoresis, fatigue and fever.  Respiratory: Negative for cough, chest tightness, shortness of breath and wheezing.   Cardiovascular: Negative for chest pain.  Gastrointestinal: Negative for abdominal distention, abdominal pain, constipation, diarrhea, nausea and vomiting.  Neurological: Negative for weakness and headaches.  Hematological: Does not bruise/bleed easily.       Objective:    BP 128/79   Pulse (!) 59   Temp 97.9 F (36.6 C) (Oral)   Wt 181 lb (82.1 kg)   BMI 30.12 kg/m  Nursing note and vital signs reviewed.  Physical Exam Constitutional:      General: He is not in acute distress.    Appearance: He is well-developed.  Cardiovascular:     Rate and Rhythm: Normal rate and regular rhythm.     Heart sounds: Normal heart sounds. No murmur heard.  No friction rub. No gallop.   Pulmonary:     Effort: Pulmonary effort is normal. No respiratory distress.     Breath sounds: Normal breath sounds. No wheezing or rales.  Chest:     Chest wall: No tenderness.  Abdominal:     General: Bowel sounds are normal. There is no distension.     Palpations: Abdomen is soft. There is no mass.     Tenderness: There is no abdominal tenderness. There is no guarding or rebound.  Skin:    General: Skin is warm and dry.  Neurological:     Mental Status: He  is alert and oriented to person, place, and time.  Psychiatric:        Behavior: Behavior normal.        Thought Content: Thought content normal.        Judgment: Judgment normal.         Assessment & Plan:   Patient Active Problem List   Diagnosis Date Noted  . Opioid use disorder ([place]) 09/28/2019  . Chronic hepatitis C ([place]) 08/07/2019  . Nicotine dependence, cigarettes, uncomplicated 87/86/7672  . Cocaine use disorder,  moderate, dependence ([place]) 07/07/2017  . Sedative, hypnotic or anxiolytic use disorder, severe, dependence ([place]) 07/07/2017  . MDD (major depressive disorder), recurrent severe, without psychosis ([place]) 03/09/2017  . Substance induced mood disorder ([place]) 03/08/2017  . Alcoholism ([place]) 09/12/2014  . Intravenous drug abuse ([place]) 09/12/2014     Problem List Items Addressed This Visit      Digestive   Chronic hepatitis C ([place]) - Primary    Mr. Solem is a 39 year old male with genotype 3 chronic hepatitis C obtained from IV drug use who is currently asymptomatic and treatment nave.  No personal/family history of liver disease.  We discussed the pathogenesis, transmission, prevention, risks if left untreated, and treatment options for hepatitis C.  We reviewed his previous lab work with time for questions.  Most blood work is up-to-date.  Check fibrotest today for fibrosis risk.  He met with pharmacy staff to complete paperwork for medication assistance.  We will plan for treatment with Epclusa or Mavyret pending blood work results.      Relevant Orders   Liver Fibrosis, FibroTest-ActiTest     Other   Intravenous drug abuse [facility])    Mr. Boardley has remained sober for approximately 2 weeks now from cocaine, methamphetamine, and heroin use.  Encouraged to continue with abstinence with support through counseling as needed.  Discussed risk of possible reinfection and risk of treatment failure with IV drug use.  Congratulated on current accomplishments.  Continue to  monitor.          I have discontinued Broadus John Kalis's doxycycline. I am also having him maintain his buPROPion, DULoxetine, ibuprofen, and buprenorphine-naloxone.   Follow-up: Pending blood work results and initiation of treatment.    Terri Piedra, MSN, FNP-C Nurse Practitioner [facility] [place] number: [phone]

## 2019-10-07 NOTE — Assessment & Plan Note (Addendum)
Patient was off of his medication for a day (He missed his appointment at [facility] and at the same time he took 2 tablet a day instead of 1 daily, and finished them earlier than expected. The refill sent for him but he was off of Suboxone for 1 or 2 days meanwhile. He had some craving and some body ache but denies any relapse on drugs.  No craving or withdrawal symptoms when he takes Suboxone BID.  -Checking UDS today - Increase Suboxone to 8-2 mg BID -Informed patient that he needs to follow up at [facility] next Tuesday 8/17 (and then frequently for now) and to sign a contract at [facility] clinic

## 2019-10-07 NOTE — Assessment & Plan Note (Signed)
Robert [name] is a 39 year old male with genotype 3 chronic hepatitis C obtained from IV drug use who is currently asymptomatic and treatment nave.  No personal/family history of liver disease.  We discussed the pathogenesis, transmission, prevention, risks if left untreated, and treatment options for hepatitis C.  We reviewed his previous lab work with time for questions.  Most blood work is up-to-date.  Check fibrotest today for fibrosis risk.  He met with pharmacy staff to complete paperwork for medication assistance.  We will plan for treatment with Epclusa or Mavyret pending blood work results.

## 2019-10-09 LAB — TOXASSURE SELECT,+ANTIDEPR,UR

## 2019-10-11 ENCOUNTER — Telehealth: Payer: Self-pay | Admitting: Pediatric Intensive Care

## 2019-10-11 NOTE — Telephone Encounter (Signed)
Call to client IDx2. Client has question regarding medication from [facility]. He received a text prompt for a pharmacy vendor. CN advised this may be from [facility]. Client is set for [facility] visit tomorrow. CN will send voucher to [facility] after clinic appointment is completed. Shann Medal RN BSN CNP [phone]

## 2019-10-12 ENCOUNTER — Other Ambulatory Visit: Payer: Self-pay

## 2019-10-12 ENCOUNTER — Ambulatory Visit (INDEPENDENT_AMBULATORY_CARE_PROVIDER_SITE_OTHER): Payer: Self-pay | Admitting: Student in an Organized Health Care Education/Training Program

## 2019-10-12 VITALS — BP 117/68 | HR 59 | Temp 98.3°F | Ht 65.0 in | Wt 184.8 lb

## 2019-10-12 DIAGNOSIS — F1190 Opioid use, unspecified, uncomplicated: Secondary | ICD-10-CM

## 2019-10-12 DIAGNOSIS — G629 Polyneuropathy, unspecified: Secondary | ICD-10-CM

## 2019-10-12 DIAGNOSIS — F1199 Opioid use, unspecified with unspecified opioid-induced disorder: Secondary | ICD-10-CM

## 2019-10-12 LAB — LIVER FIBROSIS, FIBROTEST-ACTITEST
ALT: 339 U/L — ABNORMAL HIGH (ref 9–46)
Alpha-2-Macroglobulin: 180 mg/dL (ref 106–279)
Apolipoprotein A1: 134 mg/dL (ref 94–176)
Bilirubin: 0.7 mg/dL (ref 0.2–1.2)
Fibrosis Score: 0.45
GGT: 123 U/L — ABNORMAL HIGH (ref 3–90)
Haptoglobin: 52 mg/dL (ref 43–212)
Necroinflammat ACT Score: 0.93
Reference ID: 3513936

## 2019-10-12 MED ORDER — BUPRENORPHINE HCL-NALOXONE HCL 8-2 MG SL SUBL: 1.0000 | SUBLINGUAL_TABLET | Freq: Two times a day (BID) | SUBLINGUAL | 0 refills | Status: DC

## 2019-10-12 MED ORDER — GABAPENTIN 100 MG PO CAPS: 100.0000 mg | ORAL_CAPSULE | Freq: Every day | ORAL | 0 refills | Status: DC

## 2019-10-12 MED ORDER — SENNA 8.6 MG PO TABS: 1.0000 | ORAL_TABLET | Freq: Every day | ORAL | 1 refills | Status: DC

## 2019-10-12 NOTE — Assessment & Plan Note (Signed)
Patient with a neuropathic type pain in the bottom of his feet and in his hands bothers him mostly at night.  Exam is mostly normal, low risk for vascular disease.  No history of diabetes, does have active hepatitis C infection which can put him at risk for neuropathy.  Reports a history of benefit to gabapentin, will start this at a low dose at 100 mg daily at bedtime.  May also be beneficial in his alcohol use disorder.  We will follow-up with him at next visit and can titrate the dose as needed.  Currently working to get hepatitis C treatment with infectious disease, I will be interested to see if this neuropathy resolves as the hepatitis C infection is treated.

## 2019-10-12 NOTE — Assessment & Plan Note (Signed)
Symptomatically doing well.  Reports no heroin use in over 3 weeks.  Last urine tox assure was appropriate.  I reviewed the database which was also appropriate.  Housing situation is a little influx, [facility].  Not sure how long he will stay there.  Reports his Suboxone is safe, stays in a locked box.  Plan to continue with Suboxone 8 mg twice daily.  Patient is uninsured but he obtains this at [facility], has someone financially supporting him.  Girlfriend also in treatment another residential facility.  We will continue with close follow-up every 2 weeks.

## 2019-10-12 NOTE — Patient Instructions (Signed)
Is great meeting you today in clinic.  You are doing very well.  Continue taking your medications as prescribed.  I have also added gabapentin 100 mg to be taken once at night.  We will follow-up with you in 2 weeks and can adjust the dose if needed.

## 2019-10-12 NOTE — Progress Notes (Signed)
   Assessment and Plan:  See Encounters tab for problem-based medical decision making.   __________________________________________________________  HPI:   39 year old person here for follow-up of medication assisted treatment for opioid use disorder.  Previously injected heroin for about 2 years.  Currently residing at [facility].  Has been on treatment with Suboxone since August 3.  Seems to be doing well with this, reports some constipation.  No fevers or chills.  Has not injected heroin in over 3 weeks.  This is his first attempt at treatment with Suboxone.  Reports his cravings are really well controlled.  Good adherence to medication.  __________________________________________________________  Problem List: Patient Active Problem List   Diagnosis Date Noted  . Neuropathy 10/12/2019  . Opioid use disorder (HCC) 09/28/2019  . Chronic hepatitis C (HCC) 08/07/2019  . Nicotine dependence, cigarettes, uncomplicated 08/07/2019  . Cocaine use disorder, moderate, dependence (HCC) 07/07/2017  . 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 Substance induced mood disorder (HCC) 03/08/2017  . Alcoholism (HCC) 09/12/2014    Medications: Reconciled today in Epic __________________________________________________________  Physical Exam:  Vital Signs: Vitals:   10/12/19 0851 10/12/19 0853  BP:  117/68  Pulse:  (!) 59  Temp:  98.3 F (36.8 C)  TempSrc:  Oral  SpO2:  99%  Weight: 184 lb 12.8 oz (83.8 kg)   Height: 5\' 5"  (1.651 m)     Gen: Well appearing, NAD Psych: Normal affect, not anxious or depressed appearing Neuro: Nonsedated, conversational, normal strength upper and lower extremities and normal gait Ext: Feet are warm and well-perfused, normal DP pulses, calluses on his feet Skin: Scarring from injections in his hands and his lower legs.  No active cellulitis, no purulence.  Has some scratches on his feet from a cat.

## 2019-10-15 NOTE — Progress Notes (Signed)
Internal Medicine Clinic Attending  Case discussed with Dr. Maryla Morrow  At the time of the visit.  We reviewed the resident's history and exam and pertinent patient test results.  I agree with the assessment, diagnosis, and plan of care documented in the resident's note.   Patient will need to follow closely until he can reliably come to clinic as requested.

## 2019-10-18 ENCOUNTER — Other Ambulatory Visit: Payer: Self-pay

## 2019-10-18 ENCOUNTER — Ambulatory Visit (INDEPENDENT_AMBULATORY_CARE_PROVIDER_SITE_OTHER): Payer: No Payment, Other | Admitting: Licensed Clinical Social Worker

## 2019-10-18 ENCOUNTER — Other Ambulatory Visit: Payer: Self-pay | Admitting: *Deleted

## 2019-10-18 DIAGNOSIS — F1910 Other psychoactive substance abuse, uncomplicated: Secondary | ICD-10-CM | POA: Diagnosis not present

## 2019-10-18 MED ORDER — BUPRENORPHINE HCL-NALOXONE HCL 8-2 MG SL SUBL: 1.0000 | SUBLINGUAL_TABLET | Freq: Two times a day (BID) | SUBLINGUAL | 0 refills | Status: DC
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
## 2019-10-19 ENCOUNTER — Telehealth: Payer: Self-pay | Admitting: Pediatric Intensive Care

## 2019-10-19 NOTE — Telephone Encounter (Signed)
Call from client. He has medication ready at [facility]. CN advised client that she will need to review his chart and submit a voucher for the medication if he wants the CNP to pay for his suboxone. CN reminded client that he needs to come to [facility] clinic to fill out more vouchers.  Shann Medal RN BSN CNP [phone]

## 2019-10-19 NOTE — Telephone Encounter (Signed)
Call to client- IDx2. CN advised client that she will leave copies of the medication voucher at [facility]. Client is to sign copies and return them to CN at [facility] clinic. CN advised client that he is not to go to [facility] to pick up his medication. They will deliver to him after CN has reviewed medications and submitted voucher. Client had virtual visit with [facility] provider yesterday. He states that he would like to continue with both Wellbutrin and Cymbalta.  Shann Medal RN BSN CNP [phone]

## 2019-10-21 ENCOUNTER — Encounter: Payer: Self-pay | Admitting: Pediatric Intensive Care

## 2019-10-25 ENCOUNTER — Telehealth: Payer: Self-pay | Admitting: Pediatric Intensive Care

## 2019-10-25 ENCOUNTER — Telehealth: Payer: Self-pay | Admitting: *Deleted

## 2019-10-25 NOTE — Telephone Encounter (Signed)
Please see victoria's encounters Pt is starting work today Wants to know if he can come in in the afternoon as late as possible for OUD and per victoria 1 month of gabapentin, wellbutrin and cymbalta. Could we have him come in this pm or tomorrow pm. We will make it clear that he will need to speak w/ his employer about morning appts .

## 2019-10-25 NOTE — Telephone Encounter (Signed)
Call from client. He is almost out of [facility] medication. CN advised client of walk in hours at [facility] office. Client states that he had called [facility] and that they said they may be able to prescribe the medication for him without an in-person visit. CN advised client that he should follow through with [facility] as he will be out of medication. Client also states that he needs to change his appointment at [facility] clinic as he is starting a new job. CN advised she will relay this information to the clinic. CN will pick up signed vouchers for [facility] this afternoon. Shann Medal RN BSN CNP [phone]

## 2019-10-25 NOTE — Congregational Nurse Program (Signed)
  Dept: [phone]   Congregational Nurse Program Note  Date of Encounter: 10/21/2019  Past Medical History: Past Medical History:  Diagnosis Date  . Anxiety   . Arthritis   . Cellulitis 09/2018  . Cellulitis and abscess of upper extremity 02/28/2018  . Cellulitis of hand, right 08/07/2019  . Cellulitis of leg, right   . Cellulitis of right hand 08/08/2019  . Chronic hepatitis C (HCC) 08/07/2019  . Depression   . Hepatitis    via pts mother  . 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 Nicotine dependence, cigarettes, uncomplicated 08/07/2019  . Right arm cellulitis 08/07/2019    Encounter Details: Encounter to pick up signed vouchers. CN reminded client that he needs to follow up with [facility] medication. Client states that he needs a different appointment time for 8/31 as he is starting work. He states he needs a late afternoon appointment. CN advised that he may not be able to get an appointment at that time. Shann Medal Rn BSN CNP

## 2019-10-25 NOTE — Telephone Encounter (Signed)
Ok with me if he comes in the PM. He will need to be assigned a PCP within the [facility].

## 2019-10-25 NOTE — Telephone Encounter (Signed)
I have called pt 2x today to offer appt, will ask victoria to let pt know appt is 1530 8/31, doriss. Will overbook slot for pt

## 2019-10-26 ENCOUNTER — Encounter: Payer: Self-pay | Admitting: Internal Medicine

## 2019-10-26 NOTE — Telephone Encounter (Signed)
Pt left this am to work in [place]. He has spoken to victoriah. rn via text and we end OUD services at this time. He is advised if he feels he needs oud services and would like to try again in future to please call or speak w/ victoria.

## 2019-11-02 ENCOUNTER — Other Ambulatory Visit: Payer: Self-pay

## 2019-11-02 ENCOUNTER — Telehealth (HOSPITAL_COMMUNITY): Payer: Self-pay | Admitting: Licensed Clinical Social Worker

## 2019-11-02 ENCOUNTER — Ambulatory Visit (HOSPITAL_COMMUNITY): Payer: Self-pay | Admitting: Licensed Clinical Social Worker

## 2019-11-02 NOTE — Telephone Encounter (Signed)
LCSW sent link to pt for virtual session. When pt failed to sign on LCSW phoned pt. He states he needs to reschedule session saying "I'm sorry something came up". Again discussed missed sessions. Agreed to schedule one appt and stressed his follow through to attend next session. Pt confirms.

## 2019-11-07 ENCOUNTER — Encounter (HOSPITAL_COMMUNITY): Payer: Self-pay

## 2019-11-07 ENCOUNTER — Emergency Department (HOSPITAL_COMMUNITY): Payer: Self-pay

## 2019-11-07 ENCOUNTER — Inpatient Hospital Stay (HOSPITAL_COMMUNITY)
Admission: EM | Admit: 2019-11-07 | Discharge: 2019-11-07 | DRG: 603 | Discharge status: Against Medical Advice | Payer: Self-pay | Attending: Family Medicine | Admitting: Family Medicine

## 2019-11-07 ENCOUNTER — Other Ambulatory Visit: Payer: Self-pay

## 2019-11-07 ENCOUNTER — Inpatient Hospital Stay (HOSPITAL_COMMUNITY): Payer: Self-pay

## 2019-11-07 DIAGNOSIS — B182 Chronic viral hepatitis C: Secondary | ICD-10-CM | POA: Diagnosis present

## 2019-11-07 DIAGNOSIS — F1020 Alcohol dependence, uncomplicated: Secondary | ICD-10-CM | POA: Diagnosis present

## 2019-11-07 DIAGNOSIS — L0390 Cellulitis, unspecified: Secondary | ICD-10-CM | POA: Diagnosis present

## 2019-11-07 DIAGNOSIS — F1420 Cocaine dependence, uncomplicated: Secondary | ICD-10-CM | POA: Diagnosis present

## 2019-11-07 DIAGNOSIS — Z20822 Contact with and (suspected) exposure to covid-19: Secondary | ICD-10-CM | POA: Diagnosis present

## 2019-11-07 DIAGNOSIS — F1510 Other stimulant abuse, uncomplicated: Secondary | ICD-10-CM | POA: Diagnosis present

## 2019-11-07 DIAGNOSIS — F1190 Opioid use, unspecified, uncomplicated: Secondary | ICD-10-CM | POA: Diagnosis present

## 2019-11-07 DIAGNOSIS — F1990 Other psychoactive substance use, unspecified, uncomplicated: Secondary | ICD-10-CM

## 2019-11-07 DIAGNOSIS — B1920 Unspecified viral hepatitis C without hepatic coma: Secondary | ICD-10-CM

## 2019-11-07 DIAGNOSIS — F419 Anxiety disorder, unspecified: Secondary | ICD-10-CM | POA: Diagnosis present

## 2019-11-07 DIAGNOSIS — F17210 Nicotine dependence, cigarettes, uncomplicated: Secondary | ICD-10-CM | POA: Diagnosis present

## 2019-11-07 DIAGNOSIS — L03114 Cellulitis of left upper limb: Principal | ICD-10-CM | POA: Diagnosis present

## 2019-11-07 DIAGNOSIS — F1110 Opioid abuse, uncomplicated: Secondary | ICD-10-CM | POA: Diagnosis present

## 2019-11-07 DIAGNOSIS — R7401 Elevation of levels of liver transaminase levels: Secondary | ICD-10-CM

## 2019-11-07 DIAGNOSIS — F1594 Other stimulant use, unspecified with stimulant-induced mood disorder: Secondary | ICD-10-CM | POA: Diagnosis present

## 2019-11-07 DIAGNOSIS — L03119 Cellulitis of unspecified part of limb: Secondary | ICD-10-CM

## 2019-11-07 DIAGNOSIS — F1994 Other psychoactive substance use, unspecified with psychoactive substance-induced mood disorder: Secondary | ICD-10-CM | POA: Diagnosis present

## 2019-11-07 DIAGNOSIS — Z79899 Other long term (current) drug therapy: Secondary | ICD-10-CM

## 2019-11-07 DIAGNOSIS — F1490 Cocaine use, unspecified, uncomplicated: Secondary | ICD-10-CM

## 2019-11-07 DIAGNOSIS — F329 Major depressive disorder, single episode, unspecified: Secondary | ICD-10-CM | POA: Diagnosis present

## 2019-11-07 DIAGNOSIS — F418 Other specified anxiety disorders: Secondary | ICD-10-CM

## 2019-11-07 DIAGNOSIS — F1199 Opioid use, unspecified with unspecified opioid-induced disorder: Secondary | ICD-10-CM

## 2019-11-07 DIAGNOSIS — L03113 Cellulitis of right upper limb: Secondary | ICD-10-CM | POA: Diagnosis present

## 2019-11-07 LAB — CBC WITH DIFFERENTIAL/PLATELET
Abs Immature Granulocytes: 0.02 10*3/uL (ref 0.00–0.07)
Basophils Absolute: 0.1 10*3/uL (ref 0.0–0.1)
Basophils Relative: 1 %
Eosinophils Absolute: 0.4 10*3/uL (ref 0.0–0.5)
Eosinophils Relative: 5 %
HCT: 42.2 % (ref 39.0–52.0)
Hemoglobin: 14.1 g/dL (ref 13.0–17.0)
Immature Granulocytes: 0 %
Lymphocytes Relative: 27 %
Lymphs Abs: 2.1 10*3/uL (ref 0.7–4.0)
MCH: 31 pg (ref 26.0–34.0)
MCHC: 33.4 g/dL (ref 30.0–36.0)
MCV: 92.7 fL (ref 80.0–100.0)
Monocytes Absolute: 0.7 10*3/uL (ref 0.1–1.0)
Monocytes Relative: 9 %
Neutro Abs: 4.5 10*3/uL (ref 1.7–7.7)
Neutrophils Relative %: 58 %
Platelets: 182 10*3/uL (ref 150–400)
RBC: 4.55 MIL/uL (ref 4.22–5.81)
RDW: 13.2 % (ref 11.5–15.5)
WBC: 7.7 10*3/uL (ref 4.0–10.5)
nRBC: 0 % (ref 0.0–0.2)

## 2019-11-07 LAB — COMPREHENSIVE METABOLIC PANEL
ALT: 285 U/L — ABNORMAL HIGH (ref 0–44)
AST: 137 U/L — ABNORMAL HIGH (ref 15–41)
Albumin: 4.1 g/dL (ref 3.5–5.0)
Alkaline Phosphatase: 86 U/L (ref 38–126)
Anion gap: 9 (ref 5–15)
BUN: 11 mg/dL (ref 6–20)
CO2: 28 mmol/L (ref 22–32)
Calcium: 9.3 mg/dL (ref 8.9–10.3)
Chloride: 102 mmol/L (ref 98–111)
Creatinine, Ser: 0.69 mg/dL (ref 0.61–1.24)
GFR calc Af Amer: >60 mL/min (ref >60)
GFR calc non Af Amer: >60 mL/min (ref >60)
Glucose, Bld: 112 mg/dL — ABNORMAL HIGH (ref 70–99)
Potassium: 3.5 mmol/L (ref 3.5–5.1)
Sodium: 139 mmol/L (ref 135–145)
Total Bilirubin: 1.1 mg/dL (ref 0.3–1.2)
Total Protein: 7.8 g/dL (ref 6.5–8.1)

## 2019-11-07 LAB — URINALYSIS, ROUTINE W REFLEX MICROSCOPIC
Bilirubin Urine: NEGATIVE
Glucose, UA: NEGATIVE mg/dL
Hgb urine dipstick: NEGATIVE
Ketones, ur: NEGATIVE mg/dL
Leukocytes,Ua: NEGATIVE
Nitrite: NEGATIVE
Protein, ur: NEGATIVE mg/dL
Specific Gravity, Urine: 1.02 (ref 1.005–1.030)
pH: 5 (ref 5.0–8.0)

## 2019-11-07 LAB — RAPID URINE DRUG SCREEN, HOSP PERFORMED
Amphetamines: NOT DETECTED
Barbiturates: NOT DETECTED
Benzodiazepines: NOT DETECTED
Cocaine: POSITIVE — AB
Opiates: NOT DETECTED
Tetrahydrocannabinol: POSITIVE — AB

## 2019-11-07 LAB — SARS CORONAVIRUS 2 BY RT PCR (HOSPITAL ORDER, PERFORMED IN ~~LOC~~ HOSPITAL LAB): SARS Coronavirus 2: NEGATIVE

## 2019-11-07 LAB — ETHANOL: Alcohol, Ethyl (B): <10 mg/dL (ref <10)

## 2019-11-07 LAB — LACTIC ACID, PLASMA: Lactic Acid, Venous: 0.9 mmol/L (ref 0.5–1.9)

## 2019-11-07 MED ORDER — ONDANSETRON HCL 4 MG/2ML IJ SOLN: 4.0000 mg | Freq: Four times a day (QID) | INTRAMUSCULAR | Status: DC | PRN

## 2019-11-07 MED ORDER — TRAZODONE HCL 50 MG PO TABS: 50.0000 mg | ORAL_TABLET | Freq: Every evening | ORAL | Status: DC | PRN

## 2019-11-07 MED ORDER — ONDANSETRON HCL 4 MG PO TABS: 4.0000 mg | ORAL_TABLET | Freq: Four times a day (QID) | ORAL | Status: DC | PRN

## 2019-11-07 MED ORDER — POLYETHYLENE GLYCOL 3350 17 G PO PACK: 17.0000 g | PACK | Freq: Every day | ORAL | Status: DC | PRN

## 2019-11-07 MED ORDER — SODIUM CHLORIDE 0.9% FLUSH: 3.0000 mL | Freq: Two times a day (BID) | INTRAVENOUS | Status: DC

## 2019-11-07 MED ORDER — IBUPROFEN 400 MG PO TABS: 400.0000 mg | ORAL_TABLET | Freq: Four times a day (QID) | ORAL | Status: DC | PRN

## 2019-11-07 MED ORDER — DULOXETINE HCL 60 MG PO CPEP
60.0000 mg | ORAL_CAPSULE | Freq: Every day | ORAL | Status: DC
  Administered 2019-11-07: 60 mg via ORAL
  Filled 2019-11-07: qty 1

## 2019-11-07 MED ORDER — BUPROPION HCL ER (XL) 150 MG PO TB24
150.0000 mg | ORAL_TABLET | Freq: Every day | ORAL | Status: DC
  Administered 2019-11-07: 150 mg via ORAL
  Filled 2019-11-07: qty 1

## 2019-11-07 MED ORDER — VANCOMYCIN HCL 1750 MG/350ML IV SOLN
1750.0000 mg | INTRAVENOUS | Status: AC
  Administered 2019-11-07: 1750 mg via INTRAVENOUS
  Filled 2019-11-07: qty 350

## 2019-11-07 MED ORDER — VANCOMYCIN HCL 1500 MG/300ML IV SOLN
1500.0000 mg | Freq: Two times a day (BID) | INTRAVENOUS | Status: DC
  Filled 2019-11-07: qty 300

## 2019-11-07 MED ORDER — SODIUM CHLORIDE 0.9 % IV BOLUS (SEPSIS)
1000.0000 mL | Freq: Once | INTRAVENOUS | Status: AC
  Administered 2019-11-07: 1000 mL via INTRAVENOUS

## 2019-11-07 MED ORDER — OXYCODONE HCL 5 MG PO TABS
5.0000 mg | ORAL_TABLET | ORAL | Status: DC | PRN
  Administered 2019-11-07: 5 mg via ORAL
  Filled 2019-11-07: qty 1

## 2019-11-07 MED ORDER — ENOXAPARIN SODIUM 40 MG/0.4ML ~~LOC~~ SOLN
40.0000 mg | SUBCUTANEOUS | Status: DC
  Filled 2019-11-07: qty 0.4

## 2019-11-07 NOTE — ED Notes (Signed)
Unsuccessful attempt to draw labs x2 

## 2019-11-07 NOTE — ED Provider Notes (Signed)
[facility] Provider Note   CSN: [id number] Arrival date & time: 11/07/19  0143     History Chief Complaint  Patient presents with  . Cellulitis    Robert [name] is a 39 y.o. male with PMHx IV drug use, chronic hep C (currently untreated), alcohol abuse who presents to the ED today with complaint of gradual onset, constant, pain/redness/swelling to bilateral hands that started yesterday. Pt endorses that he shoots up in his hands and feet; he states he has had cellulitis in the past from same requiring admission. He states the redness is not as bad as last time he was admitted but he wanted to come early this time and get it under control. He reports he last used around 8 PM last night; he also drank alcohol last night prior to arriving in the ED. Pt is unsure if he has had fevers however has had chills. No other complaints at this time.   The history is provided by the patient and medical records.       Past Medical History:  Diagnosis Date  . Anxiety   . Arthritis   . Cellulitis 09/2018  . Cellulitis and abscess of upper extremity 02/28/2018  . Cellulitis of hand, right 08/07/2019  . Cellulitis of leg, right   . Cellulitis of right hand 08/08/2019  . Chronic hepatitis C (HCC) 08/07/2019  . Depression   . Hepatitis    via pts mother  . 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 Nicotine dependence, cigarettes, uncomplicated 08/07/2019  . Right arm cellulitis 08/07/2019    Patient Active Problem List   Diagnosis Date Noted  . Cellulitis 11/07/2019  . Neuropathy 10/12/2019  . Opioid use disorder (HCC) 09/28/2019  . Chronic hepatitis C (HCC) 08/07/2019  . Nicotine dependence, cigarettes, uncomplicated 08/07/2019  . Cocaine use disorder, moderate, dependence (HCC) 07/07/2017  . 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 Substance induced mood disorder (HCC)  03/08/2017  . Alcoholism (HCC) 09/12/2014    Past Surgical History:  Procedure Laterality Date  . GALLBLADDER SURGERY         Family History  Problem Relation Age of Onset  . Diabetes Mother   . Diabetes Maternal Grandfather     Social History   Tobacco Use  . Smoking status: Current Every Day Smoker    Packs/day: 0.30    Types: Cigarettes, E-cigarettes  . Smokeless tobacco: Never Used  . Tobacco comment: .5 PPD and vaping  Vaping Use  . Vaping Use: Some days  Substance Use Topics  . Alcohol use: Not Currently    Alcohol/week: 0.0 standard drinks  . Drug use: Not Currently    Types: Cocaine, Heroin, Methamphetamines    Comment: Sober x weeks     Home Medications Prior to Admission medications   Medication Sig Start Date End Date Taking? Authorizing Provider  buprenorphine-naloxone (SUBOXONE) 8-2 mg SUBL SL tablet Place 1 tablet under the tongue in the morning and at bedtime. 10/18/19   Tyson Alias, MD  buPROPion (WELLBUTRIN XL) 150 MG 24 hr tablet Take 1 tablet (150 mg total) by mouth daily. 09/22/19 10/22/19  Mayers, Cari S, PA-C  DULoxetine (CYMBALTA) 30 MG capsule Take 1 capsule (30 mg total) by mouth daily. 09/22/19 11/07/19  Mayers, Cari S, PA-C  gabapentin (NEURONTIN) 100 MG capsule Take 1 capsule (100 mg total) by mouth at bedtime. 10/12/19   Tyson Alias, MD  ibuprofen (  ADVIL) 600 MG tablet Take 1 tablet (600 mg total) by mouth every 8 (eight) hours as needed. 09/22/19   Mayers, Cari S, PA-C  senna (SENOKOT) 8.6 MG TABS tablet Take 1 tablet (8.6 mg total) by mouth daily. 10/12/19   Tyson Alias, MD    Allergies    Hydrocodone  Review of Systems   Review of Systems  Constitutional: Positive for chills. Negative for fever.  Respiratory: Negative for shortness of breath.   Cardiovascular: Negative for chest pain.  Musculoskeletal: Positive for arthralgias and joint swelling.  Skin: Positive for color change.  All other systems  reviewed and are negative.   Physical Exam Updated Vital Signs BP 124/80 (BP Location: Right Arm)   Pulse (!) 55   Temp 98 F (36.7 C) (Oral)   Resp 15   Ht 5\' 5"  (1.651 m)   Wt 81.6 kg   SpO2 100%   BMI 29.95 kg/m   Physical Exam Vitals and nursing note reviewed.  Constitutional:      Appearance: He is not ill-appearing or diaphoretic.  HENT:     Head: Normocephalic and atraumatic.  Eyes:     Conjunctiva/sclera: Conjunctivae normal.  Cardiovascular:     Rate and Rhythm: Normal rate and regular rhythm.     Pulses: Normal pulses.  Pulmonary:     Effort: Pulmonary effort is normal.     Breath sounds: Normal breath sounds. No wheezing, rhonchi or rales.  Abdominal:     Palpations: Abdomen is soft.     Tenderness: There is no abdominal tenderness. There is no guarding or rebound.  Musculoskeletal:     Cervical back: Neck supple.     Comments: See photos below.  + Erythema, edema, and increased warmth to the touch to dorsal aspect of bilateral hands; TTP diffusely. ROM intact to all fingers and wrists; 2+ radial pulse; Cap refill < 2 seconds; no red streaking appreciated  Skin:    General: Skin is warm and dry.  Neurological:     Mental Status: He is alert.         ED Results / Procedures / Treatments   Labs (all labs ordered are listed, but only abnormal results are displayed) Labs Reviewed  COMPREHENSIVE METABOLIC PANEL - Abnormal; Notable for the following components:      Result Value   Glucose, Bld 112 (*)    AST 137 (*)    ALT 285 (*)    All other components within normal limits  SARS CORONAVIRUS 2 BY RT PCR (HOSPITAL ORDER, PERFORMED IN [facility])  CULTURE, BLOOD (SINGLE)  CBC WITH DIFFERENTIAL/PLATELET  LACTIC ACID, PLASMA  ETHANOL  URINALYSIS, ROUTINE W REFLEX MICROSCOPIC  RAPID URINE DRUG SCREEN, HOSP PERFORMED    EKG None  Radiology DG Hand Complete Left  Result Date: 11/07/2019 CLINICAL DATA:  IV drug user with bilateral  hand cellulitis EXAM: LEFT HAND - COMPLETE 3+ VIEW COMPARISON:  Radiographs of the contralateral right hand FINDINGS: There is no evidence of fracture or dislocation. There is no evidence of arthropathy or other focal bone abnormality. Foci of soft tissue swelling present over the dorsum of the hand. No radiopaque retained foreign body identified. IMPRESSION: 1. No acute fracture, malalignment or radiopaque foreign body. 2. Foci of soft tissue swelling overlying the dorsum of the hand consistent with the clinical history of cellulitis. Electronically Signed   By: 01/07/2020 M.D.   On: 11/07/2019 10:54   DG Hand Complete Right  Result Date:  11/07/2019 CLINICAL DATA:  IV drug user with bilateral hand cellulitis EXAM: RIGHT HAND - COMPLETE 3+ VIEW COMPARISON:  Concurrently obtained radiographs of the contralateral left hand FINDINGS: There is no evidence of fracture or dislocation. Remote healed boxer's fracture of the fifth metacarpal. There is no evidence of arthropathy or other focal bone abnormality. Soft tissues are unremarkable. No radiopaque foreign body. IMPRESSION: Negative. Electronically Signed   By: Malachy Moan M.D.   On: 11/07/2019 10:52    Procedures Procedures (including critical care time)  Medications Ordered in ED Medications  vancomycin (VANCOREADY) IVPB 1750 mg/350 mL (1,750 mg Intravenous New Bag/Given 11/07/19 1122)  sodium chloride 0.9 % bolus 1,000 mL (0 mLs Intravenous Stopped 11/07/19 1311)    ED Course  I have reviewed the triage vital signs and the nursing notes.  Pertinent labs & imaging results that were available during my care of the patient were reviewed by me and considered in my medical decision making (see chart for details).    MDM Rules/Calculators/A&P                          39 year old male with history of IV drug use who presents to the ED today complaining of bilateral hand pain, redness, swelling for the past day.  He reports that he  injects into his hands.  History of cellulitis secondary to IV drug use with admission earlier this year.  Patient has been having chills however denies fever.  On arrival to the ED patient is afebrile, nontachycardic nontachypneic.  He appears to be resting comfortably in the bed on my exam.  He does report he last used IV drugs and alcohol last night prior to coming to the ED.  Reports that he will use "crack, cocaine, meth, heroin, what ever I can get my hands on."  Exam patient does have erythema, edema, increased warmth to his dorsal aspect of bilateral hands.  No red streaking appreciated.  Good distal pulses.  Range of motion intact.  I have low suspicion for septic arthritis at this time, there is some concern for osteo-.  We will plan for x-rays at this time to assess.  We will plan for blood culture.  We will plan for lab work including lactic acid, EtOH, UDS.    Lab work was obtained will patient was in the waiting room.  CBC without cocytosis, white blood cell count of 7.7.  Hemoglobin stable at 14.1.  CMP with elevated LFTs, AST 137/ALT 285.  No other electrolyte abnormalities.   Lactic acid WNL at 0.9 ETOH negative   Xrays with findings consistent with cellulitis; no bony involvement.  Labwork overall reassuring however there is concern for noncompliance if discharged with oral abx; will call for admission for IV abx.   Discussed case with Dr. Crissie Reese who agrees to evaluate patient for admission.   This note was prepared using Dragon voice recognition software and may include unintentional dictation errors due to the inherent limitations of voice recognition software.  Final Clinical Impression(s) / ED Diagnoses Final diagnoses:  Cellulitis, unspecified cellulitis site  IVDU (intravenous drug user)    Rx / DC Orders ED Discharge Orders    None       Tanda Rockers, PA-C 11/07/19 1312    Pollyann Savoy, MD 11/07/19 1954

## 2019-11-07 NOTE — H&P (Signed)
[facility] History and Physical  Robert [name] UXL:[id number] DOB: 05/27/1980 DOA: 11/07/2019  Referring physician: Dr. Bernette Mayers PCP: Patient, No Pcp Per   Chief Complaint: Hand swelling  HPI: Robert [name] is a 39 y.o. male with history of polysubstance use disorder (alcohol, opioids, cocaine, amphetamines), mood disorders, and multiple admissions for cellulitis wrist related to injection drug use presents with bilateral hand swelling.  Patient reports he has been injecting primarily heroin and occasionally crack into his hands and toes.  He has previously had cellulitis in these areas and over the last several days he began to have similar sensations to his last episode of cellulitis in his hands which prompted him to present to the hospital.  He denies any fever, nausea or vomiting.  He has noted some swelling and redness in both of his hands.  He reports that he uses safe injection practices and only clean needles.  In the ED initial vital signs notable for mild bradycardia in the 50s.  Lab work-up notable for unremarkable CBC, CMP showing mildly improved transaminitis with AST 137 ALT 285.  Lactic acid was normal.  Covid test was negative.  Bilateral plain films of each hand were obtained and showed some soft tissue swelling but no evidence of osteomyelitis.  He was given 1 L normal saline and started on vancomycin.  He was admitted for further management of his hand cellulitis.  Review of Systems:  Pertinent positives and negative per HPI, all others reviewed and negative  Past Medical History:  Diagnosis Date  . Anxiety   . Arthritis   . Cellulitis 09/2018  . Cellulitis and abscess of upper extremity 02/28/2018  . Cellulitis of hand, right 08/07/2019  . Cellulitis of leg, right   . Cellulitis of right hand 08/08/2019  . Chronic hepatitis C (HCC) 08/07/2019  . Depression   . Hepatitis    via pts mother  . MDD (major depressive disorder), recurrent severe, without psychosis (HCC)  03/09/2017  . Nicotine dependence, cigarettes, uncomplicated 08/07/2019  . Right arm cellulitis 08/07/2019   Past Surgical History:  Procedure Laterality Date  . GALLBLADDER SURGERY     Social History:  reports that he has been smoking cigarettes and e-cigarettes. He has been smoking about 0.30 packs per day. He has never used smokeless tobacco. He reports previous alcohol use. He reports previous drug use. Drugs: Cocaine, Heroin, and Methamphetamines.  Allergies  Allergen Reactions  . Hydrocodone     nausea    Family History  Problem Relation Age of Onset  . Diabetes Mother   . Diabetes Maternal Grandfather      Prior to Admission medications   Medication Sig Start Date End Date Taking? Authorizing Provider  buprenorphine-naloxone (SUBOXONE) 8-2 mg SUBL SL tablet Place 1 tablet under the tongue in the morning and at bedtime. 10/18/19   Tyson Alias, MD  buPROPion (WELLBUTRIN XL) 150 MG 24 hr tablet Take 1 tablet (150 mg total) by mouth daily. 09/22/19 10/22/19  Mayers, Cari S, PA-C  DULoxetine (CYMBALTA) 30 MG capsule Take 1 capsule (30 mg total) by mouth daily. 09/22/19 11/07/19  Mayers, Cari S, PA-C  gabapentin (NEURONTIN) 100 MG capsule Take 1 capsule (100 mg total) by mouth at bedtime. 10/12/19   Tyson Alias, MD  ibuprofen (ADVIL) 600 MG tablet Take 1 tablet (600 mg total) by mouth every 8 (eight) hours as needed. 09/22/19   Mayers, Cari S, PA-C  senna (SENOKOT) 8.6 MG TABS tablet Take 1 tablet (8.6 mg total)  by mouth daily. 10/12/19   Tyson Alias, MD   Physical Exam: Vitals:   11/07/19 0820 11/07/19 1044 11/07/19 1130 11/07/19 1230  BP: 124/80 (!) 127/92 118/72 107/67  Pulse: (!) 55 (!) 55 (!) 54 (!) 57  Resp: 15 16 18 18   Temp:      TempSrc:      SpO2: 100% 97% 99% 97%  Weight:      Height:        Wt Readings from Last 3 Encounters:  11/07/19 81.6 kg  10/12/19 83.8 kg  10/07/19 82.1 kg     . General:  Appears calm and comfortable . Eyes:  PERRL, normal lids, irises & conjunctiva . ENT: grossly normal hearing, lips & tongue . Neck: no masses . Cardiovascular: RRR, no m/r/g. No LE edema. 12/07/19 Respiratory: CTA bilaterally, no w/r/r. Normal respiratory effort. . Abdomen: soft, ntnd . Skin: Approximately 3 cm area of edema and erythema over the dorsum of the left hand,?  Some mild finger movement when asked though not spontaneously endorsed by patient.  Smaller scattered areas of erythema over dorsum of right hand.  No fingers appear significantly swollen. . Musculoskeletal: grossly normal tone BUE/BLE . Psychiatric: grossly normal mood and affect, speech fluent and appropriate . Neurologic: grossly non-focal.          Labs on Admission:  Basic Metabolic Panel: Recent Labs  Lab 11/07/19 0243  NA 139  K 3.5  CL 102  CO2 28  GLUCOSE 112*  BUN 11  CREATININE 0.69  CALCIUM 9.3   Liver Function Tests: Recent Labs  Lab 11/07/19 0243  AST 137*  ALT 285*  ALKPHOS 86  BILITOT 1.1  PROT 7.8  ALBUMIN 4.1   No results for input(s): LIPASE, AMYLASE in the last 168 hours. No results for input(s): AMMONIA in the last 168 hours. CBC: Recent Labs  Lab 11/07/19 0243  WBC 7.7  NEUTROABS 4.5  HGB 14.1  HCT 42.2  MCV 92.7  PLT 182   Cardiac Enzymes: No results for input(s): CKTOTAL, CKMB, CKMBINDEX, TROPONINI in the last 168 hours.  BNP (last 3 results) No results for input(s): BNP in the last 8760 hours.  ProBNP (last 3 results) No results for input(s): PROBNP in the last 8760 hours.  CBG: No results for input(s): GLUCAP in the last 168 hours.  Radiological Exams on Admission: DG Hand Complete Left  Result Date: 11/07/2019 CLINICAL DATA:  IV drug user with bilateral hand cellulitis EXAM: LEFT HAND - COMPLETE 3+ VIEW COMPARISON:  Radiographs of the contralateral right hand FINDINGS: There is no evidence of fracture or dislocation. There is no evidence of arthropathy or other focal bone abnormality. Foci of soft  tissue swelling present over the dorsum of the hand. No radiopaque retained foreign body identified. IMPRESSION: 1. No acute fracture, malalignment or radiopaque foreign body. 2. Foci of soft tissue swelling overlying the dorsum of the hand consistent with the clinical history of cellulitis. Electronically Signed   By: 01/07/2020 M.D.   On: 11/07/2019 10:54   DG Hand Complete Right  Result Date: 11/07/2019 CLINICAL DATA:  IV drug user with bilateral hand cellulitis EXAM: RIGHT HAND - COMPLETE 3+ VIEW COMPARISON:  Concurrently obtained radiographs of the contralateral left hand FINDINGS: There is no evidence of fracture or dislocation. Remote healed boxer's fracture of the fifth metacarpal. There is no evidence of arthropathy or other focal bone abnormality. Soft tissues are unremarkable. No radiopaque foreign body. IMPRESSION: Negative. Electronically Signed  By: Malachy Moan M.D.   On: 11/07/2019 10:52    EKG: Independently reviewed.  Sinus bradycardia, no ischemic changes, compared to prior bradycardia is new but otherwise no changes.  Assessment/Plan Active Problems:   Alcoholism (HCC)   Substance induced mood disorder (HCC)   Cocaine use disorder, moderate, dependence (HCC)   Chronic hepatitis C (HCC)   Nicotine dependence, cigarettes, uncomplicated   Opioid use disorder (HCC)   Cellulitis   #Cellulitis Patient presenting with bilateral hand cellulitis secondary to use of IV injection.  Started on vancomycin in the ED, will continue this and reassess in a.m. for transition to oral antibiotics.  Discussed with patient importance of cleaning technique should he continue to inject, including only using plain fresh needles and clean water. -Continue vancomycin per pharmacy protocol  #Polysubstance use disorder #Opioid use disorder U tox pending, has been positive for opiates, cocaine, benzodiazepine, amphetamine in the past.  Patient is open about his use of these substances,  recent uses been primarily heroin and occasionally cocaine.  He states he is not open to medication assisted therapy at this time.  Patient counseled on higher rates of successful abstinence when using medication assisted therapy, he will consider. -Monitor on COWS scale -Re-offer MAT if COWS score consistently greater than 8  #Transaminitis #Chronic HCV Transaminitis somewhat improved from prior admissions.  Had work-up during last admission with ongoing positive HCV load, negative HIV, fibrotest staged F1-F2.  Does not appear to have had any imaging of his liver, will order an ultrasound.  Has had antibody testing for immunity to hep A but not to hep B, will order serologies and vaccinate if not immune given high risk. -Right upper quadrant ultrasound -Follow-up hep B serologies and vaccinate if nonimmune -Needs outpatient follow-up for treatment of chronic hep C  #Chronic medical problems Depression/anxiety: Continue Wellbutrin, Cymbalta  Code Status: Full code DVT Prophylaxis: Lovenox Family Communication: None Disposition Plan: Inpatient   Time spent: 50 min  Venora Maples MD/MPH [facility]

## 2019-11-07 NOTE — ED Provider Notes (Signed)
Patient seen and examined, agree with assessment and plan by APP. Patient with IVDU here with cellulitis of UE. No signs of sepsis. Plan admission for abx.     Pollyann Savoy, MD 11/07/19 1220

## 2019-11-07 NOTE — Progress Notes (Signed)
A consult was received from an ED physician for Vancomycin per pharmacy dosing.  The patient's profile has been reviewed for ht/wt/allergies/indication/available labs.    A one time order has been placed for Vancomycin 1750mg  IV.  Further antibiotics/pharmacy consults should be ordered by admitting physician if indicated.                       Thank you, 11/07/2019  10:26 AM

## 2019-11-07 NOTE — Progress Notes (Signed)
Patient left AMA due to his girlfriend not being to stay the night in hospital with him. Patient would not sign AMA form before leaving. Patient pulled his own IV out and informed staff that he was leaving. AC spoke with patient and informed him that him leaving would be against MD orders and patient left unit. Marcelle Overlie, RN

## 2019-11-07 NOTE — Progress Notes (Signed)
Pharmacy Antibiotic Note  Robert [name] is a 39 y.o. male admitted on 11/07/2019 with cellulitis.  Pharmacy has been consulted for Vancomycin dosing.  Plan: Vancomycin 1750mg  IV x 1 given in the ED. Continue with Vancomycin 1500mg  IV q12h. Vancomycin trough level at steady state.  Monitor renal function, cultures, clinical course.    Height: 5\' 5"  (165.1 cm) Weight: 81.6 kg (180 lb) IBW/kg (Calculated) : 61.5  Temp (24hrs), Avg:98.1 F (36.7 C), Min:97.7 F (36.5 C), Max:98.5 F (36.9 C)  Recent Labs  Lab 11/07/19 0243 11/07/19 0932  WBC 7.7  --   CREATININE 0.69  --   LATICACIDVEN  --  0.9    Estimated Creatinine Clearance: 121.9 mL/min (by C-G formula based on SCr of 0.69 mg/dL).    Allergies  Allergen Reactions  . Hydrocodone     nausea    Antimicrobials this admission: 9/12 Vancomycin >>  Dose adjustments this admission: ---  Microbiology results: 9/12 BCx: sent 9/12 COVID: negative  Thank you for allowing pharmacy to be a part of this patient's care.   11/12 11/07/2019 4:22 PM

## 2019-11-07 NOTE — ED Triage Notes (Signed)
Patient arrived with complaints of cellulitis on bilateral hands/wrists that he states started a few hours ago. Redness noted.

## 2019-11-12 LAB — CULTURE, BLOOD (SINGLE)
Culture: NO GROWTH
Special Requests: ADEQUATE

## 2019-11-21 ENCOUNTER — Encounter (HOSPITAL_COMMUNITY): Payer: Self-pay | Admitting: Emergency Medicine

## 2019-11-21 ENCOUNTER — Emergency Department (HOSPITAL_COMMUNITY)
Admission: EM | Admit: 2019-11-21 | Discharge: 2019-11-21 | Disposition: A | Discharge status: Home / Self Care | Payer: Self-pay | Attending: Emergency Medicine | Admitting: Emergency Medicine

## 2019-11-21 ENCOUNTER — Telehealth (HOSPITAL_COMMUNITY): Payer: No Payment, Other | Admitting: Adult Health

## 2019-11-21 ENCOUNTER — Other Ambulatory Visit: Payer: Self-pay

## 2019-11-21 DIAGNOSIS — K0889 Other specified disorders of teeth and supporting structures: Secondary | ICD-10-CM | POA: Insufficient documentation

## 2019-11-21 DIAGNOSIS — R1084 Generalized abdominal pain: Secondary | ICD-10-CM

## 2019-11-21 DIAGNOSIS — L03113 Cellulitis of right upper limb: Secondary | ICD-10-CM | POA: Insufficient documentation

## 2019-11-21 DIAGNOSIS — F17200 Nicotine dependence, unspecified, uncomplicated: Secondary | ICD-10-CM | POA: Insufficient documentation

## 2019-11-21 LAB — COMPREHENSIVE METABOLIC PANEL
ALT: 168 U/L — ABNORMAL HIGH (ref 0–44)
AST: 114 U/L — ABNORMAL HIGH (ref 15–41)
Albumin: 4.2 g/dL (ref 3.5–5.0)
Alkaline Phosphatase: 85 U/L (ref 38–126)
Anion gap: 10 (ref 5–15)
BUN: 6 mg/dL (ref 6–20)
CO2: 25 mmol/L (ref 22–32)
Calcium: 9 mg/dL (ref 8.9–10.3)
Chloride: 107 mmol/L (ref 98–111)
Creatinine, Ser: 0.7 mg/dL (ref 0.61–1.24)
GFR calc Af Amer: >60 mL/min (ref >60)
GFR calc non Af Amer: >60 mL/min (ref >60)
Glucose, Bld: 115 mg/dL — ABNORMAL HIGH (ref 70–99)
Potassium: 3.3 mmol/L — ABNORMAL LOW (ref 3.5–5.1)
Sodium: 142 mmol/L (ref 135–145)
Total Bilirubin: 0.6 mg/dL (ref 0.3–1.2)
Total Protein: 8.1 g/dL (ref 6.5–8.1)

## 2019-11-21 LAB — LIPASE, BLOOD: Lipase: 25 U/L (ref 11–51)

## 2019-11-21 LAB — CBC
HCT: 42.6 % (ref 39.0–52.0)
Hemoglobin: 14.2 g/dL (ref 13.0–17.0)
MCH: 30.6 pg (ref 26.0–34.0)
MCHC: 33.3 g/dL (ref 30.0–36.0)
MCV: 91.8 fL (ref 80.0–100.0)
Platelets: 207 10*3/uL (ref 150–400)
RBC: 4.64 MIL/uL (ref 4.22–5.81)
RDW: 13.3 % (ref 11.5–15.5)
WBC: 6.5 10*3/uL (ref 4.0–10.5)
nRBC: 0 % (ref 0.0–0.2)

## 2019-11-21 LAB — LACTIC ACID, PLASMA: Lactic Acid, Venous: 1.7 mmol/L (ref 0.5–1.9)

## 2019-11-21 MED ORDER — HYDROMORPHONE HCL 1 MG/ML IJ SOLN
1.0000 mg | Freq: Once | INTRAMUSCULAR | Status: AC
  Administered 2019-11-21: 1 mg via INTRAMUSCULAR
  Filled 2019-11-21: qty 1

## 2019-11-21 MED ORDER — SULFAMETHOXAZOLE-TRIMETHOPRIM 800-160 MG PO TABS
1.0000 | ORAL_TABLET | Freq: Once | ORAL | Status: AC
  Administered 2019-11-21: 1 via ORAL
  Filled 2019-11-21: qty 1

## 2019-11-21 MED ORDER — KETOROLAC TROMETHAMINE 15 MG/ML IJ SOLN
15.0000 mg | Freq: Once | INTRAMUSCULAR | Status: AC
  Administered 2019-11-21: 15 mg via INTRAMUSCULAR
  Filled 2019-11-21: qty 1

## 2019-11-21 MED ORDER — SULFAMETHOXAZOLE-TRIMETHOPRIM 800-160 MG PO TABS: 1.0000 | ORAL_TABLET | Freq: Two times a day (BID) | ORAL | 0 refills | Status: AC

## 2019-11-21 NOTE — Discharge Instructions (Addendum)
As discussed, your evaluation today has been largely reassuring.  But, it is important that you monitor your condition carefully, and do not hesitate to return to the ED if you develop new, or concerning changes in your condition.  Please take all antibiotics as prescribed and be sure to follow-up with one of our dentists for appropriate ongoing management of your broken tooth.

## 2019-11-21 NOTE — ED Triage Notes (Signed)
Per pt, right hand and left forearm swelling and redness due to using IV meth-symptoms for 3 days-states his stomach hurts as well

## 2019-11-21 NOTE — ED Provider Notes (Signed)
[facility]-EMERGENCY DEPT Provider Note   CSN: [id number] Arrival date & time: 11/21/19  1339     History Chief Complaint  Patient presents with  . Abdominal Pain  . Recurrent Skin Infections    Robert [name] is a 39 y.o. male.  HPI    Patient presents with several concerns. Patient knowledges a history of ongoing IV drug use, as well as hepatitis. He is currently residing in a rehabilitation setting. He presents today due to concern for right lower dental pain, swelling and erythema in his right hand, similar lesion in his left forearm, and diffuse abdominal pain. Last IV meth use was 2 days ago, injecting into his hand, arm. Since that time he has noticed increasing pain, swelling, redness as above. He notes that he has had cellulitis on multiple prior occurrences, typically associated with diffuse abdominal pain similar to what he is currently experiencing. No vomiting. No fever. No chest pain, no dyspnea. Patient also complains of pain in his right lower jaw, where he has a known broken tooth It seems as though he has not achieved relief with anything.  Past Medical History:  Diagnosis Date  . Anxiety   . Arthritis   . Cellulitis 09/2018  . Cellulitis and abscess of upper extremity 02/28/2018  . Cellulitis of hand, right 08/07/2019  . Cellulitis of leg, right   . Cellulitis of right hand 08/08/2019  . Chronic hepatitis C (HCC) 08/07/2019  . Depression   . Hepatitis    via pts mother  . 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 Nicotine dependence, cigarettes, uncomplicated 08/07/2019  . Right arm cellulitis 08/07/2019    Patient Active Problem List   Diagnosis Date Noted  . Cellulitis 11/07/2019  . Neuropathy 10/12/2019  . Opioid use disorder (HCC) 09/28/2019  . Chronic hepatitis C (HCC) 08/07/2019  . Nicotine dependence, cigarettes, uncomplicated 08/07/2019  . Cocaine use disorder, moderate,  dependence (HCC) 07/07/2017  . 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 Substance induced mood disorder (HCC) 03/08/2017  . Alcoholism (HCC) 09/12/2014    Past Surgical History:  Procedure Laterality Date  . GALLBLADDER SURGERY         Family History  Problem Relation Age of Onset  . Diabetes Mother   . Diabetes Maternal Grandfather     Social History   Tobacco Use  . Smoking status: Current Every Day Smoker    Packs/day: 0.30    Types: Cigarettes, E-cigarettes  . Smokeless tobacco: Never Used  . Tobacco comment: .5 PPD and vaping  Vaping Use  . Vaping Use: Some days  Substance Use Topics  . Alcohol use: Not Currently    Alcohol/week: 0.0 standard drinks  . Drug use: Not Currently    Types: Cocaine, Heroin, Methamphetamines    Comment: Sober x weeks     Home Medications Prior to Admission medications   Medication Sig Start Date End Date Taking? Authorizing Provider  buPROPion (WELLBUTRIN XL) 150 MG 24 hr tablet Take 1 tablet (150 mg total) by mouth daily. 09/22/19 11/07/19  Mayers, Cari S, PA-C  DULoxetine (CYMBALTA) 30 MG capsule Take 1 capsule (30 mg total) by mouth daily. 09/22/19 11/07/19  Mayers, Cari S, PA-C  ibuprofen (ADVIL) 200 MG tablet Take 400-800 mg by mouth every 6 (six) hours as needed for fever, headache or moderate pain.    [provider]  ibuprofen (ADVIL) 600 MG tablet Take 1 tablet (600 mg  total) by mouth every 8 (eight) hours as needed. Patient not taking: Reported on 11/07/2019 09/22/19   Mayers, Cari S, PA-C  Multiple Vitamin (MULTIVITAMIN WITH MINERALS) TABS tablet Take 1 tablet by mouth daily.    [provider]  senna (SENOKOT) 8.6 MG TABS tablet Take 1 tablet (8.6 mg total) by mouth daily. Patient not taking: Reported on 11/07/2019 10/12/19   Tyson Alias, MD  sulfamethoxazole-trimethoprim (BACTRIM DS) 800-160 MG tablet Take 1 tablet by mouth 2 (two) times daily for 7 days.  11/21/19 11/28/19  Gerhard Munch, MD    Allergies    Hydrocodone  Review of Systems   Review of Systems  Constitutional:       Per HPI, otherwise negative  HENT:       Per HPI, otherwise negative  Respiratory:       Per HPI, otherwise negative  Cardiovascular:       Per HPI, otherwise negative  Gastrointestinal: Negative for vomiting.  Endocrine:       Negative aside from HPI  Genitourinary:       Neg aside from HPI   Musculoskeletal:       Per HPI, otherwise negative  Skin: Positive for color change and wound.  Neurological: Negative for syncope.    Physical Exam Updated Vital Signs BP (!) 139/97 (BP Location: Left Arm)   Pulse 82   Temp 98.2 F (36.8 C) (Oral)   Resp 12   SpO2 100%   Physical Exam Vitals and nursing note reviewed.  Constitutional:      General: He is not in acute distress.    Appearance: He is well-developed. He is not ill-appearing, toxic-appearing or diaphoretic.  HENT:     Head: Normocephalic and atraumatic.     Mouth/Throat:   Eyes:     Conjunctiva/sclera: Conjunctivae normal.  Cardiovascular:     Rate and Rhythm: Normal rate and regular rhythm.     Pulses: Normal pulses.  Pulmonary:     Effort: Pulmonary effort is normal. No respiratory distress.     Breath sounds: No stridor.  Abdominal:     General: There is no distension.     Tenderness: There is no abdominal tenderness. There is no guarding or rebound.  Musculoskeletal:       Arms:  Skin:    General: Skin is warm and dry.  Neurological:     Mental Status: He is alert and oriented to person, place, and time.     ED Results / Procedures / Treatments   Labs (all labs ordered are listed, but only abnormal results are displayed) Labs Reviewed  COMPREHENSIVE METABOLIC PANEL - Abnormal; Notable for the following components:      Result Value   Potassium 3.3 (*)    Glucose, Bld 115 (*)    AST 114 (*)    ALT 168 (*)    All other components within normal limits  LIPASE,  BLOOD  CBC  LACTIC ACID, PLASMA  URINALYSIS, ROUTINE W REFLEX MICROSCOPIC    EKG None  Radiology No results found.  Procedures Procedures (including critical care time)  Medications Ordered in ED Medications  sulfamethoxazole-trimethoprim (BACTRIM DS) 800-160 MG per tablet 1 tablet (has no administration in time range)  HYDROmorphone (DILAUDID) injection 1 mg (has no administration in time range)  ketorolac (TORADOL) 15 MG/ML injection 15 mg (has no administration in time range)    ED Course  I have reviewed the triage vital signs and the nursing notes.  Pertinent labs &  imaging results that were available during my care of the patient were reviewed by me and considered in my medical decision making (see chart for details).  Adult male presents with pain in several areas.  Patient is awake, alert, hemodynamically unremarkable, has labs that are reassuring, no evidence of bacteremia, sepsis. Patient is a known IV drug user, has evidence for cellulitis in several areas, but no evidence for distal neurovascular compromise, nor loss of function in any extremity. Patient appropriate for initiation of antibiotics, received analgesia here, was discharged with outpatient follow-up. Final Clinical Impression(s) / ED Diagnoses Final diagnoses:  Cellulitis of right upper extremity  Generalized abdominal pain    Rx / DC Orders ED Discharge Orders         Ordered    sulfamethoxazole-trimethoprim (BACTRIM DS) 800-160 MG tablet  2 times daily        11/21/19 1556           Gerhard Munch, MD 11/21/19 1603

## 2019-12-15 ENCOUNTER — Telehealth (HOSPITAL_COMMUNITY): Payer: No Payment, Other | Admitting: Physician Assistant

## 2019-12-22 ENCOUNTER — Telehealth (HOSPITAL_COMMUNITY): Payer: Self-pay | Admitting: Licensed Clinical Social Worker

## 2019-12-22 ENCOUNTER — Ambulatory Visit (HOSPITAL_COMMUNITY): Payer: Self-pay | Admitting: Licensed Clinical Social Worker

## 2019-12-22 ENCOUNTER — Other Ambulatory Visit: Payer: Self-pay

## 2019-12-22 NOTE — Telephone Encounter (Signed)
LCSW sent text with video link for 11am session per schedule. When pt failed to sign on LCSW called pt. Call went directly to vm. Outgoing message stated vm box was full thus LCSW unable to lvm.

## 2019-12-23 ENCOUNTER — Telehealth: Payer: Self-pay | Admitting: Pediatric Intensive Care

## 2019-12-23 NOTE — Telephone Encounter (Signed)
Call from client. He is currently in a rehab facility in [place]. He states that he is being admitted to [facility] in [place] the second week of November and needs his Cybalta refilled at 60 mg. He states that he has to have it for [facility]. CN advised client that she will check to see if the medication can be bridged. CN also asked about other medications but the client stated he only needed Cymbalta. CN will return call at [phone]. Shann Medal RN BSN CNP [phone]

## 2019-12-28 ENCOUNTER — Telehealth: Payer: Self-pay | Admitting: Pediatric Intensive Care

## 2019-12-28 NOTE — Telephone Encounter (Signed)
Call to client at rehab facility. ID x2. CN advised client that he would need to see provider on mobile clinic when he is admitted to [facility]. He states that he will be transferred on 11/10. CN advised client that [facility] staff should be able to provide transportation to mobile clinic for follow up. Shann Medal RN BSN CNP [phone]

## 2019-12-29 ENCOUNTER — Telehealth: Payer: Self-pay

## 2019-12-29 NOTE — Telephone Encounter (Signed)
MMU Case Manager contacted [employer] by phone in conjnction with CN program to assit with coordinating an office visit for the patient. MMU Case Manager LVM and callback number.

## 2020-01-11 ENCOUNTER — Other Ambulatory Visit (HOSPITAL_COMMUNITY): Payer: Self-pay | Admitting: Critical Care Medicine

## 2020-01-11 ENCOUNTER — Telehealth: Payer: Self-pay | Admitting: Pediatric Intensive Care

## 2020-01-11 DIAGNOSIS — F332 Major depressive disorder, recurrent severe without psychotic features: Secondary | ICD-10-CM

## 2020-01-11 MED ORDER — DULOXETINE HCL 30 MG PO CPEP: 30.0000 mg | ORAL_CAPSULE | Freq: Every day | ORAL | 1 refills | Status: DC

## 2020-01-11 MED FILL — DULoxetine HCL 30 MG CPEP: 30 | 30 days supply | Qty: 30 | Fill #0

## 2020-01-11 NOTE — Telephone Encounter (Signed)
Refill for cymbalta sent to [facility]

## 2020-01-11 NOTE — Addendum Note (Signed)
Addended by: Shan Levans E on: 01/11/2020 11:52 AM   Modules accepted: Orders

## 2020-01-11 NOTE — Telephone Encounter (Signed)
Call to Sarah, intake coordinator at [facility] in [place]. She states that client has not been able to get mobile clinic appointment or appointment at [facility] to get refills for medication. Client will be day patient at [facility] in [place] for intensive counseling services and will not be able to go to a clinic appointment. He has 3 days left of cymbalta prescription. CN will contact medical director for bridge. Shann Medal Rn BSN CNP [phone]

## 2020-01-26 ENCOUNTER — Other Ambulatory Visit: Payer: Self-pay

## 2020-01-26 ENCOUNTER — Emergency Department (HOSPITAL_COMMUNITY): Payer: Self-pay

## 2020-01-26 ENCOUNTER — Emergency Department (HOSPITAL_COMMUNITY)
Admission: EM | Admit: 2020-01-26 | Discharge: 2020-01-26 | Disposition: A | Discharge status: Home / Self Care | Payer: Self-pay | Attending: Emergency Medicine | Admitting: Emergency Medicine

## 2020-01-26 DIAGNOSIS — F17290 Nicotine dependence, other tobacco product, uncomplicated: Secondary | ICD-10-CM | POA: Insufficient documentation

## 2020-01-26 DIAGNOSIS — K122 Cellulitis and abscess of mouth: Secondary | ICD-10-CM | POA: Insufficient documentation

## 2020-01-26 DIAGNOSIS — L0100 Impetigo, unspecified: Secondary | ICD-10-CM | POA: Insufficient documentation

## 2020-01-26 DIAGNOSIS — Z79899 Other long term (current) drug therapy: Secondary | ICD-10-CM | POA: Insufficient documentation

## 2020-01-26 DIAGNOSIS — F1990 Other psychoactive substance use, unspecified, uncomplicated: Secondary | ICD-10-CM

## 2020-01-26 DIAGNOSIS — R2231 Localized swelling, mass and lump, right upper limb: Secondary | ICD-10-CM | POA: Insufficient documentation

## 2020-01-26 DIAGNOSIS — F1910 Other psychoactive substance abuse, uncomplicated: Secondary | ICD-10-CM | POA: Insufficient documentation

## 2020-01-26 DIAGNOSIS — L0390 Cellulitis, unspecified: Secondary | ICD-10-CM

## 2020-01-26 DIAGNOSIS — F17210 Nicotine dependence, cigarettes, uncomplicated: Secondary | ICD-10-CM | POA: Insufficient documentation

## 2020-01-26 MED ORDER — DOXYCYCLINE HYCLATE 100 MG PO CAPS: 100.0000 mg | ORAL_CAPSULE | Freq: Two times a day (BID) | ORAL | 0 refills | Status: DC

## 2020-01-26 MED ORDER — MUPIROCIN CALCIUM 2 % EX CREA
TOPICAL_CREAM | Freq: Once | CUTANEOUS | Status: AC
  Filled 2020-01-26: qty 15

## 2020-01-26 MED ORDER — DOXYCYCLINE HYCLATE 100 MG PO TABS
100.0000 mg | ORAL_TABLET | Freq: Once | ORAL | Status: AC
  Administered 2020-01-26: 100 mg via ORAL
  Filled 2020-01-26: qty 1

## 2020-01-26 MED ORDER — IBUPROFEN 200 MG PO TABS
600.0000 mg | ORAL_TABLET | Freq: Once | ORAL | Status: AC
  Administered 2020-01-26: 600 mg via ORAL
  Filled 2020-01-26: qty 3

## 2020-01-26 MED ORDER — ACETAMINOPHEN 500 MG PO TABS
1000.0000 mg | ORAL_TABLET | Freq: Once | ORAL | Status: AC
  Administered 2020-01-26: 1000 mg via ORAL
  Filled 2020-01-26: qty 2

## 2020-01-26 NOTE — Discharge Instructions (Signed)
Please stop injecting drugs into your arms.  I do not see signs of abscess on ultrasound today and x-ray does not show any retained needles.  These areas may develop abscess.  Please take doxycycline orally twice daily and use mupirocin ointment over scabs and skin lesions around the mouth and on the fingers twice a day as well.  If you develop worsening redness and swelling over the areas where you have recently injected, or develop fevers or other new or concerning symptoms you should return for reevaluation.

## 2020-01-26 NOTE — ED Provider Notes (Signed)
[facility]   CSN: [id number] Arrival date & time: 01/26/20  0225     History Chief Complaint  Patient presents with  . Mouth Lesions    lips and tongue.     Robert [name] is a 39 y.o. male.  Robert [name] is a 39 y.o. male with a history of cellulitis, IV drug use and polysubstance abuse, who presents to the emergency department via EMS for evaluation of painful lesions around his mouth.  He states these have been here for at least 4 days, possibly longer.  He states he feels the need to pick at them which he states temporarily relieves pain but then they start to hurt worse.  He has a large lesion on his bottom lip and multiple lesions at the corners of the mouth.  Lesions are scabbed and crusted over.  He also reports 2 areas of swelling to the right arm over the dorsum of the hand and the mid forearm and an area of swelling over the left mid forearm where he reports he injected drugs yesterday.  He is concerned that these are forming an abscess, history of multiple abscesses and episodes of cellulitis previously.  He also states some additional scabbed over lesions on his hands.  Has not had any fevers or chills.  Does report smoking crack about an hour prior to arrival.  States that he was trying to go to rehab tonight, but EMS could not transport him there so he requested to be brought here instead.  States he was clean and doing better for a while but has relapsed and been using drugs heavily over the past week.        Past Medical History:  Diagnosis Date  . Anxiety   . Arthritis   . Cellulitis 09/2018  . Cellulitis and abscess of upper extremity 02/28/2018  . Cellulitis of hand, right 08/07/2019  . Cellulitis of leg, right   . Cellulitis of right hand 08/08/2019  . Chronic hepatitis C (HCC) 08/07/2019  . Depression   . Hepatitis    via pts mother  . MDD (major depressive disorder), recurrent severe, without psychosis (HCC)  03/09/2017  . Nicotine dependence, cigarettes, uncomplicated 08/07/2019  . Right arm cellulitis 08/07/2019    Patient Active Problem List   Diagnosis Date Noted  . Cellulitis 11/07/2019  . Neuropathy 10/12/2019  . Opioid use disorder 09/28/2019  . Chronic hepatitis C (HCC) 08/07/2019  . Nicotine dependence, cigarettes, uncomplicated 08/07/2019  . Cocaine use disorder, moderate, dependence (HCC) 07/07/2017  . 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 Substance induced mood disorder (HCC) 03/08/2017  . Alcoholism (HCC) 09/12/2014    Past Surgical History:  Procedure Laterality Date  . GALLBLADDER SURGERY         Family History  Problem Relation Age of Onset  . Diabetes Mother   . Diabetes Maternal Grandfather     Social History   Tobacco Use  . Smoking status: Current Every Day Smoker    Packs/day: 0.30    Types: Cigarettes, E-cigarettes  . Smokeless tobacco: Never Used  . Tobacco comment: .5 PPD and vaping  Vaping Use  . Vaping Use: Some days  Substance Use Topics  . Alcohol use: Not Currently    Alcohol/week: 0.0 standard drinks  . Drug use: Not Currently    Types: Cocaine, Heroin, Methamphetamines    Comment: Sober x weeks     Home Medications Prior to Admission  medications   Medication Sig Start Date End Date Taking? Authorizing Provider  ARIPiprazole (ABILIFY) 2 MG tablet Take 2 mg by mouth daily.   Yes [provider]  DULoxetine (CYMBALTA) 30 MG capsule Take 1 capsule (30 mg total) by mouth daily. 01/11/20 02/10/20 Yes Storm FriskWright, Patrick E, MD  ibuprofen (ADVIL) 200 MG tablet Take 400-800 mg by mouth every 6 (six) hours as needed for fever, headache or moderate pain.   Yes [provider]  buPROPion (WELLBUTRIN XL) 150 MG 24 hr tablet Take 1 tablet (150 mg total) by mouth daily. Patient not taking: Reported on 01/26/2020 09/22/19 11/07/19  Mayers, Kasandra Knudsenari S, PA-C  doxycycline (VIBRAMYCIN) 100 MG capsule  Take 1 capsule (100 mg total) by mouth 2 (two) times daily. One po bid x 7 days 01/26/20   Dartha LodgeFord, Roquel Burgin N, PA-C    Allergies    Hydrocodone  Review of Systems   Review of Systems  Constitutional: Negative for chills and fever.  HENT: Positive for mouth sores.   Gastrointestinal: Negative for nausea and vomiting.  Musculoskeletal: Negative for arthralgias and joint swelling.  Skin: Positive for color change. Negative for rash and wound.  Neurological: Negative for weakness and numbness.  All other systems reviewed and are negative.   Physical Exam Updated Vital Signs BP (!) 145/93   Pulse 99   Temp 98.4 F (36.9 C) (Oral)   Resp 12   SpO2 97%   Physical Exam Vitals and nursing note reviewed.  Constitutional:      General: He is not in acute distress.    Appearance: Normal appearance. He is well-developed. He is not ill-appearing or diaphoretic.  HENT:     Head: Normocephalic and atraumatic.     Mouth/Throat:     Mouth: Mucous membranes are moist.     Pharynx: Oropharynx is clear.     Comments: Patient with several lesions around the mouth with overlying honey colored crust, no fluctuance or purulent drainage, patient has 1 small lesion to the tongue that appears to be likely be an inflamed taste but her aphthous ulcer but no other intraoral lesions noted, posterior oropharynx is clear, patient tolerating secretions, normal phonation. Eyes:     General:        Right eye: No discharge.        Left eye: No discharge.  Pulmonary:     Effort: Pulmonary effort is normal. No respiratory distress.  Musculoskeletal:     Comments: Patient with soft tissue swelling over the dorsum of the midshaft forearm on the left and right arm, prior surgical scars from drainage noted, the areas are swollen but not fluctuant, there is not significant overlying erythema.  There is a similar area of soft tissue swelling over the dorsum of the hand, again without fluctuance, mildly erythematous but not  warm to the touch, no expressible drainage.  Distal pulses 2+, normal strength and sensation in the hands.  Skin:    General: Skin is warm and dry.     Comments: Perioral lesions as noted above, a few similar scabbed lesions over the hands that patient is repetitively picking at, no evidence of Janeway lesions  Neurological:     Mental Status: He is alert and oriented to person, place, and time.     Coordination: Coordination normal.  Psychiatric:        Mood and Affect: Mood normal.        Behavior: Behavior normal.     ED Results / Procedures /  Treatments   Labs (all labs ordered are listed, but only abnormal results are displayed) Labs Reviewed - No data to display  EKG None  Radiology DG Forearm Left  Result Date: 01/26/2020 CLINICAL DATA:  IV drug use, assess for retained foreign body. EXAM: LEFT FOREARM - 2 VIEW COMPARISON:  None. FINDINGS: Two view radiograph left forearm demonstrates normal alignment. No fracture or dislocation. No osseous erosion. There is soft tissue swelling along the dorsum of the left forearm proximally. No retained radiopaque foreign body identified. IMPRESSION: Soft tissue swelling.  No radiopaque retained foreign body. Electronically Signed   By: Helyn Numbers MD   On: 01/26/2020 04:24   DG Forearm Right  Result Date: 01/26/2020 CLINICAL DATA:  IV drug use, assess for retained foreign body EXAM: RIGHT FOREARM - 2 VIEW COMPARISON:  None. FINDINGS: There is no evidence of fracture or other focal bone lesions. Soft tissues are unremarkable. No retained radiopaque foreign body identified. IMPRESSION: Negative. Electronically Signed   By: Helyn Numbers MD   On: 01/26/2020 04:25    Procedures Ultrasound ED Soft Tissue  Date/Time: 01/26/2020 5:31 AM Performed by: Dartha Lodge, PA-C Authorized by: Dartha Lodge, PA-C   Procedure details:    Indications: localization of abscess and evaluate for cellulitis     Transverse view:  Visualized    Longitudinal view:  Visualized   Images: not archived   Location:    Location: upper extremity     : Bilateral. Findings:     no abscess present    cellulitis present Comments:     Patient complaining of pain and swelling over bilateral forearms where he injected drugs last night as well as over the top of the right hand.  X-rays negative for retained needle, bedside ultrasound performed which show signs of cellulitis and soft tissue swelling but no fluid collection or drainable abscess.   (including critical care time)  Medications Ordered in ED Medications  doxycycline (VIBRA-TABS) tablet 100 mg (100 mg Oral Given 01/26/20 0420)  mupirocin cream (BACTROBAN) 2 % ( Topical Given 01/26/20 0454)  ibuprofen (ADVIL) tablet 600 mg (600 mg Oral Given 01/26/20 0420)  acetaminophen (TYLENOL) tablet 1,000 mg (1,000 mg Oral Given 01/26/20 0420)    ED Course  I have reviewed the triage vital signs and the nursing notes.  Pertinent labs & imaging results that were available during my care of the patient were reviewed by me and considered in my medical decision making (see chart for details).    MDM Rules/Calculators/A&P                         39 year old male presents with perioral lesions as well as areas of soft tissue swelling to bilateral forearms and the dorsum of the right hand.  Perioral lesions most consistent with impetigo, patient known MRSA colonizer, he also has areas of soft tissue swelling without fluctuance or expressible drainage, x-rays of the forearm show no evidence of retained needle or other foreign body, I performed bedside ultrasound over these areas and saw signs of cellulitis but no drainable fluid collection on either of the forearm lesions or the dorsum of the right hand.  Patient injected drugs into these areas just yesterday and is likely too early for abscess formation.  Will treat with mupirocin and doxycycline.  Discussed with patient that he may eventually develop  abscesses in these areas and discussed strict return precautions.   At this time there does  not appear to be any evidence of an acute emergency medical condition and the patient appears stable for discharge with appropriate outpatient follow up.Diagnosis was discussed with patient who verbalizes understanding and is agreeable to discharge.   Final Clinical Impression(s) / ED Diagnoses Final diagnoses:  Impetigo  IV drug user  Cellulitis, unspecified cellulitis site    Rx / DC Orders ED Discharge Orders         Ordered    doxycycline (VIBRAMYCIN) 100 MG capsule  2 times daily        01/26/20 0520           Dartha Lodge, PA-C 01/26/20 0650    Dione Booze, MD 01/26/20 [phone]

## 2020-01-26 NOTE — ED Triage Notes (Addendum)
Patient is A&0x 4 complaints of sore on lip and bumps on the lips and tongue. Smoked crack 1-2 hours ago.   EMS was [facility] because patient wanted transportation to a rehab facility. EMS could not transport patient there so he said just take me to [name] so they can look at my lips.

## 2020-02-03 ENCOUNTER — Telehealth: Payer: Self-pay | Admitting: Pediatric Intensive Care

## 2020-02-03 NOTE — Telephone Encounter (Signed)
Have called pt and scheduled for 12/14 at 0915, gave him 0900 time for visit. He was agreeable

## 2020-02-03 NOTE — Telephone Encounter (Signed)
Yes, that is appropriate, thank you.

## 2020-02-03 NOTE — Telephone Encounter (Signed)
Return call to client. ID x2. Client states that he is at [facility] and will be released tomorrow to [facility] (unknown location as of now) He will receive a suboxone bridge but needs to have an appointment with a suboxone provider in order to receive bridge. CN advised client to call [facility] OUD clinic to speak with Myriam Jacobson RN regarding his current concerns. Cn advised to call ASAP. CN asked client to check in with her once he is discharged and has a working cell phone. Shann Medal RN BSN CNP [phone]

## 2020-02-03 NOTE — Telephone Encounter (Signed)
He calls and states he is wanting to start OUD clinic again. He states he is in a better state of mind and very serious about doing well. He has spoken with [place] h. And she has educated him on taking the steps to help self this time and he was very positive about doing so. Please view Victoria's note and may I place him in a 12/14 slot?

## 2020-02-08 ENCOUNTER — Telehealth: Payer: Self-pay | Admitting: *Deleted

## 2020-02-08 NOTE — Telephone Encounter (Signed)
Pt calls and states he is in hospital in [place] for stroke like symptoms. States he does not know when he will be disch and they may keep him for detox.  appt rescheduled for 12/21 at 0845

## 2020-02-08 NOTE — Telephone Encounter (Signed)
Ok. Thank you.

## 2020-02-15 ENCOUNTER — Telehealth: Payer: Self-pay | Admitting: *Deleted

## 2020-02-15 NOTE — Telephone Encounter (Signed)
Attempted to reach patient for today's missed appt. Call cannot be completed at this time for both numbers listed. Kinnie Feil, BSN, RN-BC

## 2020-03-07 ENCOUNTER — Emergency Department (HOSPITAL_COMMUNITY)
Admission: EM | Admit: 2020-03-07 | Discharge: 2020-03-07 | Disposition: A | Discharge status: Home / Self Care | Payer: Self-pay | Attending: Emergency Medicine | Admitting: Emergency Medicine

## 2020-03-07 ENCOUNTER — Encounter (HOSPITAL_COMMUNITY): Payer: Self-pay

## 2020-03-07 ENCOUNTER — Other Ambulatory Visit: Payer: Self-pay

## 2020-03-07 ENCOUNTER — Emergency Department (HOSPITAL_BASED_OUTPATIENT_CLINIC_OR_DEPARTMENT_OTHER): Payer: Self-pay

## 2020-03-07 DIAGNOSIS — M7989 Other specified soft tissue disorders: Secondary | ICD-10-CM

## 2020-03-07 DIAGNOSIS — L03119 Cellulitis of unspecified part of limb: Secondary | ICD-10-CM | POA: Insufficient documentation

## 2020-03-07 DIAGNOSIS — F17210 Nicotine dependence, cigarettes, uncomplicated: Secondary | ICD-10-CM | POA: Insufficient documentation

## 2020-03-07 DIAGNOSIS — F17290 Nicotine dependence, other tobacco product, uncomplicated: Secondary | ICD-10-CM | POA: Insufficient documentation

## 2020-03-07 DIAGNOSIS — L0390 Cellulitis, unspecified: Secondary | ICD-10-CM

## 2020-03-07 LAB — COMPREHENSIVE METABOLIC PANEL
ALT: 141 U/L — ABNORMAL HIGH (ref 0–44)
AST: 53 U/L — ABNORMAL HIGH (ref 15–41)
Albumin: 3.7 g/dL (ref 3.5–5.0)
Alkaline Phosphatase: 83 U/L (ref 38–126)
Anion gap: 10 (ref 5–15)
BUN: 12 mg/dL (ref 6–20)
CO2: 26 mmol/L (ref 22–32)
Calcium: 8.6 mg/dL — ABNORMAL LOW (ref 8.9–10.3)
Chloride: 97 mmol/L — ABNORMAL LOW (ref 98–111)
Creatinine, Ser: 0.77 mg/dL (ref 0.61–1.24)
GFR, Estimated: >60 mL/min (ref >60)
Glucose, Bld: 136 mg/dL — ABNORMAL HIGH (ref 70–99)
Potassium: 3.3 mmol/L — ABNORMAL LOW (ref 3.5–5.1)
Sodium: 133 mmol/L — ABNORMAL LOW (ref 135–145)
Total Bilirubin: 0.7 mg/dL (ref 0.3–1.2)
Total Protein: 7.4 g/dL (ref 6.5–8.1)

## 2020-03-07 LAB — CBC WITH DIFFERENTIAL/PLATELET
Abs Immature Granulocytes: 0.04 10*3/uL (ref 0.00–0.07)
Basophils Absolute: 0.1 10*3/uL (ref 0.0–0.1)
Basophils Relative: 1 %
Eosinophils Absolute: 0.9 10*3/uL — ABNORMAL HIGH (ref 0.0–0.5)
Eosinophils Relative: 9 %
HCT: 39 % (ref 39.0–52.0)
Hemoglobin: 13.2 g/dL (ref 13.0–17.0)
Immature Granulocytes: 0 %
Lymphocytes Relative: 20 %
Lymphs Abs: 2.2 10*3/uL (ref 0.7–4.0)
MCH: 31.1 pg (ref 26.0–34.0)
MCHC: 33.8 g/dL (ref 30.0–36.0)
MCV: 92 fL (ref 80.0–100.0)
Monocytes Absolute: 1.4 10*3/uL — ABNORMAL HIGH (ref 0.1–1.0)
Monocytes Relative: 13 %
Neutro Abs: 6 10*3/uL (ref 1.7–7.7)
Neutrophils Relative %: 57 %
Platelets: 174 10*3/uL (ref 150–400)
RBC: 4.24 MIL/uL (ref 4.22–5.81)
RDW: 12.3 % (ref 11.5–15.5)
WBC: 10.6 10*3/uL — ABNORMAL HIGH (ref 4.0–10.5)
nRBC: 0 % (ref 0.0–0.2)

## 2020-03-07 LAB — LACTIC ACID, PLASMA: Lactic Acid, Venous: 1.2 mmol/L (ref 0.5–1.9)

## 2020-03-07 MED ORDER — CLINDAMYCIN PHOSPHATE 600 MG/50ML IV SOLN
600.0000 mg | Freq: Once | INTRAVENOUS | Status: AC
  Administered 2020-03-07: 600 mg via INTRAVENOUS
  Filled 2020-03-07: qty 50

## 2020-03-07 MED ORDER — CLINDAMYCIN HCL 150 MG PO CAPS: 450.0000 mg | ORAL_CAPSULE | Freq: Three times a day (TID) | ORAL | 0 refills | Status: DC

## 2020-03-07 MED ORDER — POTASSIUM CHLORIDE CRYS ER 20 MEQ PO TBCR
40.0000 meq | EXTENDED_RELEASE_TABLET | Freq: Once | ORAL | Status: AC
  Administered 2020-03-07: 40 meq via ORAL
  Filled 2020-03-07: qty 2

## 2020-03-07 MED ORDER — POTASSIUM CHLORIDE ER 10 MEQ PO TBCR: 10.0000 meq | EXTENDED_RELEASE_TABLET | Freq: Every day | ORAL | 1 refills | Status: AC

## 2020-03-07 NOTE — Discharge Instructions (Addendum)
Please take the antibiotic as prescribed until finished.  Please abstain from using IV drugs as this is very dangerous for your health.  Please see the attached handout for rehab centers.  Return to the ER for worsening redness, swelling, fevers, chills or any other new or concerning symptoms.  Your potassium was a little low today.  Please take the supplements daily.  Your liver function test were also slightly elevated today, please make sure to have this rechecked with your primary care doctor.

## 2020-03-07 NOTE — ED Notes (Signed)
Pt discharged from this ED in stable condition at this time. All discharge instructions and follow up care reviewed with pt with no further questions at this time. Pt ambulatory with steady gait, clear speech.  

## 2020-03-07 NOTE — Progress Notes (Signed)
Right lower extremity venous duplex has been completed. Preliminary results can be found in CV Proc through chart review.  Results were given to Trudee Grip PA.  03/07/20 10:16 AM Olen Cordial RVT

## 2020-03-07 NOTE — ED Triage Notes (Signed)
Multiple abscesses to bilateral arms and legs. Pt admits to polysubstance abuse. Prior to arrival pt sts using iv meth, crack, and heroin.

## 2020-03-07 NOTE — ED Provider Notes (Signed)
[facility]-EMERGENCY DEPT Provider Note   CSN: [id number] Arrival date & time: 03/07/20  0321     History Chief Complaint  Patient presents with  . Abscess    Robert [name] is a 40 y.o. male.  HPI 40 year old male with a history of anxiety, depression, IV drug use, cellulitis presents to the ER with complaints of cellulitis to his right arm left arm and and right leg.  Patient reports using IV meth, heroin just several hours ago.  States that his fiance died several weeks ago and he has been "a mess".  He denies any fevers or chills.  No chest pain or shortness of breath.  No SI/HI.  He states that he will normally get antibiotics and this will be helpful for him.    Past Medical History:  Diagnosis Date  . Anxiety   . Arthritis   . Cellulitis 09/2018  . Cellulitis and abscess of upper extremity 02/28/2018  . Cellulitis of hand, right 08/07/2019  . Cellulitis of leg, right   . Cellulitis of right hand 08/08/2019  . Chronic hepatitis C (HCC) 08/07/2019  . Depression   . Hepatitis    via pts mother  . 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 Nicotine dependence, cigarettes, uncomplicated 08/07/2019  . Right arm cellulitis 08/07/2019    Patient Active Problem List   Diagnosis Date Noted  . Cellulitis 11/07/2019  . Neuropathy 10/12/2019  . Opioid use disorder 09/28/2019  . Chronic hepatitis C (HCC) 08/07/2019  . Nicotine dependence, cigarettes, uncomplicated 08/07/2019  . Cocaine use disorder, moderate, dependence (HCC) 07/07/2017  . 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 Substance induced mood disorder (HCC) 03/08/2017  . Alcoholism (HCC) 09/12/2014    Past Surgical History:  Procedure Laterality Date  . GALLBLADDER SURGERY         Family History  Problem Relation Age of Onset  . Diabetes Mother   . Diabetes Maternal Grandfather     Social History    Tobacco Use  . Smoking status: Current Every Day Smoker    Packs/day: 0.30    Types: Cigarettes, E-cigarettes  . Smokeless tobacco: Never Used  . Tobacco comment: .5 PPD and vaping  Vaping Use  . Vaping Use: Some days  Substance Use Topics  . Alcohol use: Not Currently    Alcohol/week: 0.0 standard drinks  . Drug use: Not Currently    Types: Cocaine, Heroin, Methamphetamines    Comment: Sober x weeks     Home Medications Prior to Admission medications   Medication Sig Start Date End Date Taking? Authorizing Provider  ARIPiprazole (ABILIFY) 2 MG tablet Take 2 mg by mouth daily.   Yes [provider]  clindamycin (CLEOCIN) 150 MG capsule Take 3 capsules (450 mg total) by mouth 3 (three) times daily for 7 days. 03/07/20 03/14/20 Yes Mare Ferrari, PA-C  DULoxetine (CYMBALTA) 60 MG capsule Take 60 mg by mouth daily. 01/13/20 03/15/20 Yes [provider]  potassium chloride (KLOR-CON) 10 MEQ tablet Take 1 tablet (10 mEq total) by mouth daily. 03/07/20  Yes Mare Ferrari, PA-C  buPROPion (WELLBUTRIN XL) 150 MG 24 hr tablet Take 1 tablet (150 mg total) by mouth daily. Patient not taking: No sig reported 09/22/19 11/07/19  Mayers, Cari S, PA-C  doxycycline (VIBRAMYCIN) 100 MG capsule Take 1 capsule (100 mg total) by mouth 2 (two) times daily. One po bid x 7 days Patient not taking:  Reported on 03/07/2020 01/26/20   Dartha Lodge, PA-C  DULoxetine (CYMBALTA) 30 MG capsule Take 1 capsule (30 mg total) by mouth daily. Patient not taking: Reported on 03/07/2020 01/11/20 02/10/20  Storm Frisk, MD    Allergies    Hydrocodone and Codeine  Review of Systems   Review of Systems  Constitutional: Negative for chills and fever.  HENT: Negative for ear pain and sore throat.   Eyes: Negative for pain and visual disturbance.  Respiratory: Negative for cough and shortness of breath.   Cardiovascular: Negative for chest pain and palpitations.  Gastrointestinal: Negative for  abdominal pain and vomiting.  Genitourinary: Negative for dysuria and hematuria.  Musculoskeletal: Negative for arthralgias and back pain.  Skin: Positive for color change and rash.  Neurological: Negative for seizures and syncope.  All other systems reviewed and are negative.   Physical Exam Updated Vital Signs BP 130/81 (BP Location: Right Arm)   Pulse 91   Temp 98.8 F (37.1 C) (Oral)   Resp 20   SpO2 99%   Physical Exam Vitals and nursing note reviewed.  Constitutional:      Appearance: He is well-developed and well-nourished.  HENT:     Head: Normocephalic and atraumatic.  Eyes:     Conjunctiva/sclera: Conjunctivae normal.  Cardiovascular:     Rate and Rhythm: Normal rate and regular rhythm.     Heart sounds: No murmur heard.   Pulmonary:     Effort: Pulmonary effort is normal. No respiratory distress.     Breath sounds: Normal breath sounds.  Abdominal:     Palpations: Abdomen is soft.     Tenderness: There is no abdominal tenderness.  Musculoskeletal:        General: No edema.     Cervical back: Neck supple.  Skin:    General: Skin is warm and dry.     Findings: Erythema present.     Comments: Warm circumferential erythema to the patient's right forearm, left forearm and right lower extremity.  2+ radial pulses and DP pulses.  Neurovascularly intact.  Mildly warm to touch.  Compartments are soft.  Several indurated pockets in his right upper and left upper extremities as well as his left lower extremity, however no central fluctuance, no drainage  Neurological:     General: No focal deficit present.     Mental Status: He is alert and oriented to person, place, and time.  Psychiatric:        Mood and Affect: Mood and affect normal.           ED Results / Procedures / Treatments   Labs (all labs ordered are listed, but only abnormal results are displayed) Labs Reviewed  CBC WITH DIFFERENTIAL/PLATELET - Abnormal; Notable for the following components:       Result Value   WBC 10.6 (*)    Monocytes Absolute 1.4 (*)    Eosinophils Absolute 0.9 (*)    All other components within normal limits  COMPREHENSIVE METABOLIC PANEL - Abnormal; Notable for the following components:   Sodium 133 (*)    Potassium 3.3 (*)    Chloride 97 (*)    Glucose, Bld 136 (*)    Calcium 8.6 (*)    AST 53 (*)    ALT 141 (*)    All other components within normal limits  CULTURE, BLOOD (ROUTINE X 2)  CULTURE, BLOOD (ROUTINE X 2)  LACTIC ACID, PLASMA  LACTIC ACID, PLASMA    EKG None  Radiology  No results found.  Procedures Procedures (including critical care time)  Medications Ordered in ED Medications  potassium chloride SA (KLOR-CON) CR tablet 40 mEq (has no administration in time range)  clindamycin (CLEOCIN) IVPB 600 mg (600 mg Intravenous New Bag/Given 03/07/20 0825)    ED Course  I have reviewed the triage vital signs and the nursing notes.  Pertinent labs & imaging results that were available during my care of the patient were reviewed by me and considered in my medical decision making (see chart for details).    MDM Rules/Calculators/A&P                         56:11 AM: 40 year old male who presents to the ER with complaints of cellulitis to his bilateral upper extremities and right lower extremity in the setting of recent IV drug use On arrival, he is visibly still intoxicated, slurring his words, but overall nontoxic.  Vitals reassuring, afebrile, not tachycardic or hypoxic.  Physical exam with evidence of cellulitis to his right upper left upper and right lower extremities.  He has indurated pockets consistent with abscess, however no central fluctuance, not amenable to drainage.  Plan to check basic labs, blood cultures, DVT study for his right lower extremity given appearance.  We will give IV clindamycin here as per discussion with my supervising physician.  9:24 AM: Labs ordered, reviewed and interpreted by me -CBC with a  leukocytosis of 10.6, no other significant abnormalities noted -CMP with mild hyponatremia, hypokalemia 3.3, LFT elevation consistent with prior values -Lactic acid is normal -Blood cultures pending  DVT US ordered, negative for DVT in RLE.   MDM: Potassium repleted orally in the ED.  Will send home with prescription.  No significant EKG changes noted.  Patient finished 600 mg dose of IV clindamycin. DVT study w/o evidence of clot. Low suspicion for sepsis at this time given reassuring labwork, vitals, and appearance. LFT elevation slightly improved.  Will send home with another course of PO clindamycin 450mg  TID x 7 days.  Strict return precautions given.  He voiced understanding and is agreeable.  Stable ED course, patient's medical screening stable for discharge. Final Clinical Impression(s) / ED Diagnoses Final diagnoses:  Cellulitis, unspecified cellulitis site    Rx / DC Orders ED Discharge Orders         Ordered    clindamycin (CLEOCIN) 150 MG capsule  3 times daily        03/07/20 0928    potassium chloride (KLOR-CON) 10 MEQ tablet  Daily        03/07/20 0929           05/05/20, PA-C 03/07/20 0934    05/05/20, MD 03/09/20 1027

## 2020-03-09 ENCOUNTER — Observation Stay (HOSPITAL_COMMUNITY)
Admission: EM | Admit: 2020-03-09 | Discharge: 2020-03-10 | Disposition: A | Discharge status: Home / Self Care | Payer: Self-pay | Attending: Internal Medicine | Admitting: Internal Medicine

## 2020-03-09 ENCOUNTER — Other Ambulatory Visit: Payer: Self-pay

## 2020-03-09 DIAGNOSIS — F17290 Nicotine dependence, other tobacco product, uncomplicated: Secondary | ICD-10-CM | POA: Insufficient documentation

## 2020-03-09 DIAGNOSIS — L0390 Cellulitis, unspecified: Secondary | ICD-10-CM | POA: Diagnosis present

## 2020-03-09 DIAGNOSIS — L03115 Cellulitis of right lower limb: Secondary | ICD-10-CM | POA: Insufficient documentation

## 2020-03-09 DIAGNOSIS — L03113 Cellulitis of right upper limb: Principal | ICD-10-CM | POA: Insufficient documentation

## 2020-03-09 DIAGNOSIS — L03114 Cellulitis of left upper limb: Secondary | ICD-10-CM | POA: Insufficient documentation

## 2020-03-09 DIAGNOSIS — Z20822 Contact with and (suspected) exposure to covid-19: Secondary | ICD-10-CM | POA: Insufficient documentation

## 2020-03-09 DIAGNOSIS — F17210 Nicotine dependence, cigarettes, uncomplicated: Secondary | ICD-10-CM | POA: Insufficient documentation

## 2020-03-09 DIAGNOSIS — B9689 Other specified bacterial agents as the cause of diseases classified elsewhere: Secondary | ICD-10-CM | POA: Insufficient documentation

## 2020-03-09 NOTE — ED Notes (Signed)
Called pt for triage no answer x2, x3

## 2020-03-09 NOTE — ED Notes (Signed)
Called pt for triage, no answer x 1.

## 2020-03-10 ENCOUNTER — Encounter (HOSPITAL_COMMUNITY): Payer: Self-pay

## 2020-03-10 ENCOUNTER — Other Ambulatory Visit: Payer: Self-pay

## 2020-03-10 ENCOUNTER — Other Ambulatory Visit (HOSPITAL_COMMUNITY): Payer: Self-pay | Admitting: Student

## 2020-03-10 DIAGNOSIS — L03115 Cellulitis of right lower limb: Secondary | ICD-10-CM

## 2020-03-10 DIAGNOSIS — F1014 Alcohol abuse with alcohol-induced mood disorder: Secondary | ICD-10-CM

## 2020-03-10 LAB — COMPREHENSIVE METABOLIC PANEL
ALT: 140 U/L — ABNORMAL HIGH (ref 0–44)
ALT: 129 U/L — ABNORMAL HIGH (ref 0–44)
AST: 89 U/L — ABNORMAL HIGH (ref 15–41)
AST: 81 U/L — ABNORMAL HIGH (ref 15–41)
Albumin: 3.5 g/dL (ref 3.5–5.0)
Albumin: 3.2 g/dL — ABNORMAL LOW (ref 3.5–5.0)
Alkaline Phosphatase: 92 U/L (ref 38–126)
Alkaline Phosphatase: 88 U/L (ref 38–126)
Anion gap: 12 (ref 5–15)
Anion gap: 9 (ref 5–15)
BUN: 8 mg/dL (ref 6–20)
BUN: 6 mg/dL (ref 6–20)
CO2: 24 mmol/L (ref 22–32)
CO2: 27 mmol/L (ref 22–32)
Calcium: 9.2 mg/dL (ref 8.9–10.3)
Calcium: 8.9 mg/dL (ref 8.9–10.3)
Chloride: 99 mmol/L (ref 98–111)
Chloride: 100 mmol/L (ref 98–111)
Creatinine, Ser: 0.7 mg/dL (ref 0.61–1.24)
Creatinine, Ser: 0.69 mg/dL (ref 0.61–1.24)
GFR, Estimated: >60 mL/min (ref >60)
GFR, Estimated: >60 mL/min (ref >60)
Glucose, Bld: 115 mg/dL — ABNORMAL HIGH (ref 70–99)
Glucose, Bld: 106 mg/dL — ABNORMAL HIGH (ref 70–99)
Potassium: 3.7 mmol/L (ref 3.5–5.1)
Potassium: 3.5 mmol/L (ref 3.5–5.1)
Sodium: 135 mmol/L (ref 135–145)
Sodium: 136 mmol/L (ref 135–145)
Total Bilirubin: 0.7 mg/dL (ref 0.3–1.2)
Total Bilirubin: 0.8 mg/dL (ref 0.3–1.2)
Total Protein: 7.6 g/dL (ref 6.5–8.1)
Total Protein: 6.9 g/dL (ref 6.5–8.1)

## 2020-03-10 LAB — CBC WITH DIFFERENTIAL/PLATELET
Abs Immature Granulocytes: 0.02 10*3/uL (ref 0.00–0.07)
Basophils Absolute: 0.1 10*3/uL (ref 0.0–0.1)
Basophils Relative: 1 %
Eosinophils Absolute: 0.5 10*3/uL (ref 0.0–0.5)
Eosinophils Relative: 8 %
HCT: 38.4 % — ABNORMAL LOW (ref 39.0–52.0)
Hemoglobin: 13.5 g/dL (ref 13.0–17.0)
Immature Granulocytes: 0 %
Lymphocytes Relative: 31 %
Lymphs Abs: 2.2 10*3/uL (ref 0.7–4.0)
MCH: 32.1 pg (ref 26.0–34.0)
MCHC: 35.2 g/dL (ref 30.0–36.0)
MCV: 91.2 fL (ref 80.0–100.0)
Monocytes Absolute: 0.8 10*3/uL (ref 0.1–1.0)
Monocytes Relative: 12 %
Neutro Abs: 3.3 10*3/uL (ref 1.7–7.7)
Neutrophils Relative %: 48 %
Platelets: 266 10*3/uL (ref 150–400)
RBC: 4.21 MIL/uL — ABNORMAL LOW (ref 4.22–5.81)
RDW: 12.3 % (ref 11.5–15.5)
WBC: 6.9 10*3/uL (ref 4.0–10.5)
nRBC: 0 % (ref 0.0–0.2)

## 2020-03-10 LAB — CBC
HCT: 39.1 % (ref 39.0–52.0)
Hemoglobin: 12.8 g/dL — ABNORMAL LOW (ref 13.0–17.0)
MCH: 30.5 pg (ref 26.0–34.0)
MCHC: 32.7 g/dL (ref 30.0–36.0)
MCV: 93.3 fL (ref 80.0–100.0)
Platelets: 240 10*3/uL (ref 150–400)
RBC: 4.19 MIL/uL — ABNORMAL LOW (ref 4.22–5.81)
RDW: 12.3 % (ref 11.5–15.5)
WBC: 5.4 10*3/uL (ref 4.0–10.5)
nRBC: 0 % (ref 0.0–0.2)

## 2020-03-10 LAB — RAPID URINE DRUG SCREEN, HOSP PERFORMED
Amphetamines: POSITIVE — AB
Barbiturates: NOT DETECTED
Benzodiazepines: NOT DETECTED
Cocaine: POSITIVE — AB
Opiates: NOT DETECTED
Tetrahydrocannabinol: POSITIVE — AB

## 2020-03-10 LAB — SARS CORONAVIRUS 2 (TAT 6-24 HRS): SARS Coronavirus 2: NEGATIVE

## 2020-03-10 LAB — ETHANOL: Alcohol, Ethyl (B): <10 mg/dL (ref <10)

## 2020-03-10 LAB — PHOSPHORUS: Phosphorus: 3.5 mg/dL (ref 2.5–4.6)

## 2020-03-10 LAB — MAGNESIUM: Magnesium: 2 mg/dL (ref 1.7–2.4)

## 2020-03-10 LAB — LACTIC ACID, PLASMA
Lactic Acid, Venous: 0.9 mmol/L (ref 0.5–1.9)
Lactic Acid, Venous: 0.8 mmol/L (ref 0.5–1.9)

## 2020-03-10 MED ORDER — LORAZEPAM 1 MG PO TABS: 1.0000 mg | ORAL_TABLET | ORAL | Status: DC | PRN

## 2020-03-10 MED ORDER — FOLIC ACID 1 MG PO TABS
1.0000 mg | ORAL_TABLET | Freq: Every day | ORAL | Status: DC
  Administered 2020-03-10: 1 mg via ORAL
  Filled 2020-03-10: qty 1

## 2020-03-10 MED ORDER — THIAMINE HCL 100 MG/ML IJ SOLN: 100.0000 mg | Freq: Every day | INTRAMUSCULAR | Status: DC

## 2020-03-10 MED ORDER — LORAZEPAM 2 MG/ML IJ SOLN: 1.0000 mg | INTRAMUSCULAR | Status: DC | PRN

## 2020-03-10 MED ORDER — ARIPIPRAZOLE 2 MG PO TABS
2.0000 mg | ORAL_TABLET | Freq: Every day | ORAL | Status: DC
  Administered 2020-03-10: 2 mg via ORAL
  Filled 2020-03-10: qty 1

## 2020-03-10 MED ORDER — CLINDAMYCIN HCL 150 MG PO CAPS: 150.0000 mg | ORAL_CAPSULE | Freq: Four times a day (QID) | ORAL | 0 refills | Status: AC

## 2020-03-10 MED ORDER — CLINDAMYCIN PHOSPHATE 600 MG/50ML IV SOLN
600.0000 mg | Freq: Once | INTRAVENOUS | Status: AC
  Administered 2020-03-10: 600 mg via INTRAVENOUS
  Filled 2020-03-10: qty 50

## 2020-03-10 MED ORDER — IBUPROFEN 400 MG PO TABS: 400.0000 mg | ORAL_TABLET | Freq: Four times a day (QID) | ORAL | Status: DC | PRN

## 2020-03-10 MED ORDER — LORAZEPAM 2 MG/ML IJ SOLN
1.0000 mg | Freq: Once | INTRAMUSCULAR | Status: AC
  Administered 2020-03-10: 1 mg via INTRAVENOUS
  Filled 2020-03-10: qty 1

## 2020-03-10 MED ORDER — THIAMINE HCL 100 MG PO TABS
100.0000 mg | ORAL_TABLET | Freq: Every day | ORAL | Status: DC
  Administered 2020-03-10: 100 mg via ORAL
  Filled 2020-03-10: qty 1

## 2020-03-10 MED ORDER — DULOXETINE HCL 60 MG PO CPEP
60.0000 mg | ORAL_CAPSULE | Freq: Every day | ORAL | Status: DC
  Administered 2020-03-10: 60 mg via ORAL
  Filled 2020-03-10: qty 1

## 2020-03-10 MED ORDER — ONDANSETRON HCL 4 MG PO TABS: 4.0000 mg | ORAL_TABLET | Freq: Four times a day (QID) | ORAL | Status: DC | PRN

## 2020-03-10 MED ORDER — ONDANSETRON HCL 4 MG/2ML IJ SOLN: 4.0000 mg | Freq: Four times a day (QID) | INTRAMUSCULAR | Status: DC | PRN

## 2020-03-10 MED ORDER — VANCOMYCIN HCL 1750 MG/350ML IV SOLN
1750.0000 mg | Freq: Once | INTRAVENOUS | Status: AC
  Administered 2020-03-10: 1750 mg via INTRAVENOUS
  Filled 2020-03-10: qty 350

## 2020-03-10 MED ORDER — ENOXAPARIN SODIUM 40 MG/0.4ML ~~LOC~~ SOLN
40.0000 mg | SUBCUTANEOUS | Status: DC
  Administered 2020-03-10: 40 mg via SUBCUTANEOUS
  Filled 2020-03-10: qty 0.4

## 2020-03-10 MED ORDER — ADULT MULTIVITAMIN W/MINERALS CH
1.0000 | ORAL_TABLET | Freq: Every day | ORAL | Status: DC
  Administered 2020-03-10: 1 via ORAL
  Filled 2020-03-10: qty 1

## 2020-03-10 MED ORDER — KETOROLAC TROMETHAMINE 15 MG/ML IJ SOLN: 15.0000 mg | Freq: Three times a day (TID) | INTRAMUSCULAR | Status: DC | PRN

## 2020-03-10 MED ORDER — VANCOMYCIN HCL 1250 MG/250ML IV SOLN: 1250.0000 mg | Freq: Two times a day (BID) | INTRAVENOUS | Status: DC

## 2020-03-10 MED ORDER — OXYCODONE HCL 5 MG PO TABS
5.0000 mg | ORAL_TABLET | ORAL | Status: DC | PRN
Start: 2020-03-10 — End: 2020-03-10

## 2020-03-10 MED FILL — CLINDAMYCIN HCL 150 MG CAP: 150 | 10 days supply | Qty: 40 | Fill #0

## 2020-03-10 NOTE — Progress Notes (Signed)
Pharmacy Antibiotic Note  Jasmond River is a 40 y.o. male admitted on 03/09/2020 with cellulitis.  Pharmacy has been consulted for vancomycin dosing. Clindamycin x 1 dose given in the ER. SCr 0.7.  Plan: Vancomycin 1750mg  IV x 1; then 1250mg  IV q12h. Goal AUC 400-550. Expected AUC: 457 SCr used: 0.8 Monitor clinical progress, c/s, renal function F/u de-escalation plan/LOT, vancomycin levels as indicated    Height: 5\' 5"  (165.1 cm) Weight: 81 kg (178 lb 9.2 oz) IBW/kg (Calculated) : 61.5  Temp (24hrs), Avg:98.4 F (36.9 C), Min:98.1 F (36.7 C), Max:98.6 F (37 C)  Recent Labs  Lab 03/07/20 0810 03/10/20 0518 03/10/20 0715  WBC 10.6* 6.9  --   CREATININE 0.77 0.70  --   LATICACIDVEN 1.2 0.9 0.8    Estimated Creatinine Clearance: 121.5 mL/min (by C-G formula based on SCr of 0.7 mg/dL).    Allergies  Allergen Reactions  . Hydrocodone Other (See Comments)    nausea  . Codeine Other (See Comments)    05/05/20, PharmD, BCPS Please check AMION for all [facility] contact numbers Clinical Pharmacist 03/10/2020 12:47 PM

## 2020-03-10 NOTE — Discharge Instructions (Signed)
Please complete the antibiotics prescribed to you and follow up with your primary care provider.

## 2020-03-10 NOTE — ED Notes (Signed)
Patient belligerent during obtaining VS and attempting to discharge. Patient demanding pain meds. Offered patient prn ibuprofen because patient does not have a ride home. Patient stating "I'd like what they gave me earlier, I want ativan." Educated patient that I cannot administer because he does not have a ride home and is was very drowsy upon my first assessment. Patient cursing in hallway and demanding to speak with admitting. Page sent to admitting.

## 2020-03-10 NOTE — Hospital Course (Signed)
Admitted 03/09/2020  Allergies: Hydrocodone and Codeine Pertinent Hx: ***anxiety, cellulitis, chronic hepatitis C, major depressive disorder, substance abuse.  40 y.o. male p/w ***  * ***Cellulitis (RUE, RLE, RLE): seen in the ED for the same x3 days ago, discharged with PO clindamycin. No systemic sign of infection. IMTS consulted for admission as he is noncompliant and high risk for severe worsening infection.   *Substance use disorder: he last used IV meth yesterday  Consults: ***  Meds: *** VTE ppx: *** IVF: *** Diet: ***   * *** *  []  *** COVID test pending  O/N events:

## 2020-03-10 NOTE — ED Notes (Signed)
PT at bedside.

## 2020-03-10 NOTE — Evaluation (Signed)
Physical Therapy Evaluation and Discharge Patient Details Name: Robert [name] MRN: [id number] DOB: Jul 04, 1980 Today's Date: 03/10/2020   History of Present Illness  Pt is a 40 y/o male admitted secondary to RLE cellulitis.PMH includes IV drug use, Hep C, and MDD.  Clinical Impression  Patient evaluated by Physical Therapy with no further acute PT needs identified. All education has been completed and the patient has no further questions. Pt overall steady with mobility; no LOB noted. Able to ambulate to/from bathroom in ED independently. Mild pain in RLE, however, did not limit mobility. Plans to d/c to shelter. See below for any follow-up Physical Therapy or equipment needs. PT is signing off. Thank you for this referral. If needs change, please re-consult.      Follow Up Recommendations No PT follow up    Equipment Recommendations  None recommended by PT    Recommendations for Other Services       Precautions / Restrictions Precautions Precautions: None Restrictions Weight Bearing Restrictions: No      Mobility  Bed Mobility Overal bed mobility: Independent                  Transfers Overall transfer level: Independent                  Ambulation/Gait Ambulation/Gait assistance: Independent Gait Distance (Feet): 125 Feet Assistive device: None Gait Pattern/deviations: WFL(Within Functional Limits) Gait velocity: WFL   General Gait Details: Able to ambulate without LOB. Pt only agreeable to ambulating to the bathroom and back. Good gait speed.  Stairs            Wheelchair Mobility    Modified Rankin (Stroke Patients Only)       Balance Overall balance assessment: No apparent balance deficits (not formally assessed)                                           Pertinent Vitals/Pain Pain Assessment: Faces Faces Pain Scale: Hurts little more Pain Location: RLE pain Pain Descriptors / Indicators: Aching Pain Intervention(s):  Limited activity within patient's tolerance;Monitored during session;Repositioned    Home Living Family/patient expects to be discharged to:: Shelter/Homeless                      Prior Function Level of Independence: Independent               Hand Dominance        Extremity/Trunk Assessment   Upper Extremity Assessment Upper Extremity Assessment: Overall WFL for tasks assessed    Lower Extremity Assessment Lower Extremity Assessment: Overall WFL for tasks assessed    Cervical / Trunk Assessment Cervical / Trunk Assessment: Normal  Communication   Communication: No difficulties  Cognition Arousal/Alertness: Suspect due to medications Behavior During Therapy: WFL for tasks assessed/performed Overall Cognitive Status: No family/caregiver present to determine baseline cognitive functioning                                 General Comments: Initially very sleepy, but alertness improved when sitting upright. Decreased safety awareness noted.      General Comments      Exercises     Assessment/Plan    PT Assessment Patent does not need any further PT services  PT Problem List  PT Treatment Interventions      PT Goals (Current goals can be found in the Care Plan section)  Acute Rehab PT Goals Patient Stated Goal: none stated PT Goal Formulation: All assessment and education complete, DC therapy Time For Goal Achievement: 03/10/20 Potential to Achieve Goals: Good    Frequency     Barriers to discharge        Co-evaluation               AM-PAC PT "6 Clicks" Mobility  Outcome Measure Help needed turning from your back to your side while in a flat bed without using bedrails?: None Help needed moving from lying on your back to sitting on the side of a flat bed without using bedrails?: None Help needed moving to and from a bed to a chair (including a wheelchair)?: None Help needed standing up from a chair using your arms  (e.g., wheelchair or bedside chair)?: None Help needed to walk in hospital room?: None Help needed climbing 3-5 steps with a railing? : None 6 Click Score: 24    End of Session   Activity Tolerance: Patient tolerated treatment well Patient left: in bed;with call bell/phone within reach (on stretcher in ED) Nurse Communication: Mobility status PT Visit Diagnosis: Other abnormalities of gait and mobility (R26.89)    Time: 6440-3474 PT Time Calculation (min) (ACUTE ONLY): 12 min   Charges:   PT Evaluation $PT Eval Low Complexity: 1 Low          Cindee Salt, DPT  Acute Rehabilitation Services  Pager: [phone] Office: [phone]   Lehman Prom 03/10/2020, 4:10 PM

## 2020-03-10 NOTE — ED Provider Notes (Signed)
[facility] EMERGENCY DEPARTMENT Provider Note   CSN: [id number] Arrival date & time: 03/09/20  2340     History Chief Complaint  Patient presents with  . Cellulitis    Robert [name] is a 40 y.o. male with past medical history significant for anxiety, cellulitis, chronic hepatitis C, major depressive disorder, substance abuse.  HPI Patient presents emergency room today with chief complaint of aggressively worsening cellulitis x3 days.  Patient states he last used IV meth yesterday.  Patient has cellulitis on bilateral arms and right leg.  He is endorsing aching pain localized to extremities.  He rates the pain 8 out of 10 in severity.  He feels like meth is pouring out of his skin he is worried it will get into his bloodstream. Patient states he has been struggling he has his fiance passed away several weeks ago.  He denies any SI or HI however.  Denies any fever, chills, purulent drainage from wounds, night sweats, anorexia, malaise, myalgia. He states he usually gets "liquid antibiotics" when he has these infections. Denise any alcohol use. Patient seen in the ED for the same x3 days ago.  He had negative [place] for DVT in right lower extremity.  He was given dose of IV clindamycin and discharged home with p.o. clindamycin x7 days. Did not pick up antibiotics because he thought the cellulitis is getting better.   Past Medical History:  Diagnosis Date  . Anxiety   . Arthritis   . Cellulitis 09/2018  . Cellulitis and abscess of upper extremity 02/28/2018  . Cellulitis of hand, right 08/07/2019  . Cellulitis of leg, right   . Cellulitis of right hand 08/08/2019  . Chronic hepatitis C (HCC) 08/07/2019  . Depression   . Hepatitis    via pts mother  . 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 Nicotine dependence, cigarettes, uncomplicated 08/07/2019  . Right arm cellulitis 08/07/2019    Patient Active Problem List   Diagnosis  Date Noted  . Cellulitis 11/07/2019  . Neuropathy 10/12/2019  . Opioid use disorder 09/28/2019  . Chronic hepatitis C (HCC) 08/07/2019  . Nicotine dependence, cigarettes, uncomplicated 08/07/2019  . Cocaine use disorder, moderate, dependence (HCC) 07/07/2017  . 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 Substance induced mood disorder (HCC) 03/08/2017  . Alcoholism (HCC) 09/12/2014    Past Surgical History:  Procedure Laterality Date  . GALLBLADDER SURGERY         Family History  Problem Relation Age of Onset  . Diabetes Mother   . Diabetes Maternal Grandfather     Social History   Tobacco Use  . Smoking status: Current Every Day Smoker    Packs/day: 0.30    Types: Cigarettes, E-cigarettes  . Smokeless tobacco: Never Used  . Tobacco comment: .5 PPD and vaping  Vaping Use  . Vaping Use: Some days  Substance Use Topics  . Alcohol use: Not Currently    Alcohol/week: 0.0 standard drinks  . Drug use: Not Currently    Types: Cocaine, Heroin, Methamphetamines    Comment: Sober x weeks     Home Medications Prior to Admission medications   Medication Sig Start Date End Date Taking? Authorizing Provider  ARIPiprazole (ABILIFY) 2 MG tablet Take 2 mg by mouth daily.    [provider]  buPROPion (WELLBUTRIN XL) 150 MG 24 hr tablet Take 1 tablet (150 mg total) by mouth daily. Patient not taking: No sig reported  09/22/19 11/07/19  Mayers, Cari S, PA-C  clindamycin (CLEOCIN) 150 MG capsule Take 3 capsules (450 mg total) by mouth 3 (three) times daily for 7 days. 03/07/20 03/14/20  Mare Ferrari, PA-C  doxycycline (VIBRAMYCIN) 100 MG capsule Take 1 capsule (100 mg total) by mouth 2 (two) times daily. One po bid x 7 days Patient not taking: Reported on 03/07/2020 01/26/20   Dartha Lodge, PA-C  DULoxetine (CYMBALTA) 30 MG capsule Take 1 capsule (30 mg total) by mouth daily. Patient not taking: Reported on 03/07/2020 01/11/20  02/10/20  Storm Frisk, MD  DULoxetine (CYMBALTA) 60 MG capsule Take 60 mg by mouth daily. 01/13/20 03/15/20  [provider]  potassium chloride (KLOR-CON) 10 MEQ tablet Take 1 tablet (10 mEq total) by mouth daily. 03/07/20   Mare Ferrari, PA-C    Allergies    Hydrocodone and Codeine  Review of Systems   Review of Systems All other systems are reviewed and are negative for acute change except as noted in the HPI.  Physical Exam Updated Vital Signs BP (!) 142/85   Pulse 87   Temp 98.6 F (37 C) (Oral)   Resp 19   Ht 5\' 5"  (1.651 m)   Wt 81 kg   SpO2 99%   BMI 29.72 kg/m   Physical Exam Vitals and nursing note reviewed.  Constitutional:      General: He is not in acute distress.    Appearance: He is not ill-appearing.  HENT:     Head: Normocephalic and atraumatic.     Right Ear: Tympanic membrane and external ear normal.     Left Ear: Tympanic membrane and external ear normal.     Nose: Nose normal.     Mouth/Throat:     Mouth: Mucous membranes are moist.     Pharynx: Oropharynx is clear.  Eyes:     General: No scleral icterus.       Right eye: No discharge.        Left eye: No discharge.     Extraocular Movements: Extraocular movements intact.     Conjunctiva/sclera: Conjunctivae normal.     Pupils: Pupils are equal, round, and reactive to light.  Neck:     Vascular: No JVD.  Cardiovascular:     Rate and Rhythm: Normal rate and regular rhythm.     Pulses: Normal pulses.          Radial pulses are 2+ on the right side and 2+ on the left side.     Heart sounds: Normal heart sounds. No murmur heard.   Pulmonary:     Comments: Lungs clear to auscultation in all fields. Symmetric chest rise. No wheezing, rales, or rhonchi. Abdominal:     Comments: Abdomen is soft, non-distended, and non-tender in all quadrants. No rigidity, no guarding. No peritoneal signs.  Musculoskeletal:        General: Normal range of motion.     Cervical back: Normal range  of motion.     Comments: Compartments soft in all extremities. All extremities are neurovascularly intact distally. Compartments soft above and below affected joint.    Skin:    General: Skin is warm and dry.     Capillary Refill: Capillary refill takes less than 2 seconds.          Comments: Please see media below.  Circumferential erythema to RUE, LUE, RLE. Extremities warm to the touch.   No Osler nodes or Janeway lesions seen on extremities  Neurological:     Mental Status: He is oriented to person, place, and time.     GCS: GCS eye subscore is 4. GCS verbal subscore is 5. GCS motor subscore is 6.     Comments: Fluent speech, no facial droop.  Psychiatric:        Mood and Affect: Mood is anxious.        Speech: Speech is tangential.        Behavior: Behavior normal.        Thought Content: Thought content does not include homicidal or suicidal ideation.            ED Results / Procedures / Treatments   Labs (all labs ordered are listed, but only abnormal results are displayed) Labs Reviewed  COMPREHENSIVE METABOLIC PANEL - Abnormal; Notable for the following components:      Result Value   Glucose, Bld 115 (*)    AST 89 (*)    ALT 140 (*)    All other components within normal limits  CBC WITH DIFFERENTIAL/PLATELET - Abnormal; Notable for the following components:   RBC 4.21 (*)    HCT 38.4 (*)    All other components within normal limits  CULTURE, BLOOD (ROUTINE X 2)  CULTURE, BLOOD (ROUTINE X 2)  SARS CORONAVIRUS 2 (TAT 6-24 HRS)  LACTIC ACID, PLASMA  LACTIC ACID, PLASMA    EKG None  Radiology No results found.  Procedures Procedures (including critical care time)  Medications Ordered in ED Medications  LORazepam (ATIVAN) injection 1 mg (1 mg Intravenous Given 03/10/20 0722)  clindamycin (CLEOCIN) IVPB 600 mg (0 mg Intravenous Stopped 03/10/20 0754)    ED Course  I have reviewed the triage vital signs and the nursing notes.  Pertinent labs  & imaging results that were available during my care of the patient were reviewed by me and considered in my medical decision making (see chart for details).    MDM Rules/Calculators/A&P                          History provided by patient with additional history obtained from chart review.    40 yo male with history IVDA presenting with cellulitis. This is second ED visit in 3 day. He is afebrile, hemodynamically stable.  No tachycardia or hypoxia.  Exam patient is very anxious.  He is cooperative yet erratic.  He has erythema on bilateral extremities and right leg.  He has areas of cellulitis without gross abscesses.  He is neurovascularly intact in all extremities. Labs were collected in triage.  Reviewed results which showed no leukocytosis, hemoglobin.  CMP without significant electrolyte derangement, no renal insufficiency.  Does have elevated LFTs consistent with his baseline.  Blood cultures were also collected.  Lactic acid within normal range.  Labs are reassuring that there is no evidence of bacteremia or sepsis. Was not compliant with antibiotics at home.  Will give dose of Ativan as he is extremely anxious and pulling at the equipment.  Also given IV clindamycin.  Bedside ultrasound performed of left wrist, there is no abscess seen, cobblestoning consistent with cellulitis. Reassessed patient.  He is sleeping however does wake up when prompted with loud voice. I was later informed by RN he was noted to have disappeared from the lobby while waiting for a bed, unsure if he had additional illicit substance abuse.  This case was discussed with Dr. Manus Gunningancour who has seen the patient and agrees with plan  to admit as he is noncompliant and high risk for severe worsening infection. Patient agrees to stay for admission. Covid test in process, has has no URI symptoms.   Unassigned admission. Case discussed with IM service who agrees to assume care of patient and bring into the hospital for further  evaluation and management. Appreciate admission.   Portions of this note were generated with Scientist, clinical (histocompatibility and immunogenetics). Dictation errors may occur despite best attempts at proofreading.   Final Clinical Impression(s) / ED Diagnoses Final diagnoses:  Cellulitis, unspecified cellulitis site    Rx / DC Orders ED Discharge Orders    None       Kandice Hams 03/10/20 1101    Glynn Octave, MD 03/10/20 1544

## 2020-03-10 NOTE — ED Triage Notes (Signed)
Pt c/o cellulitis present in multiple abscesses to bilateral arms/legs. Pt states he feels like meth is coming out of his skin because it wasn't properly absorbed into his bloodstream. Pt states last use was yesterday. Pt cooperative but erratic in triage.

## 2020-03-10 NOTE — ED Notes (Signed)
Patient self-removed PIV. Attempted to provided discharge instructions to patient. Patient continues to be very belligerent. Patient stating "I don't need the discharge instructions." Patient offered 2 bus passes and shelter information provided by case management and patient states "yes I want those." Patient given discharge instructions, 2 bus passes, shelter information, and filled abx prescription. Patient ambulatory to bathroom to change into personal clothing.

## 2020-03-10 NOTE — ED Notes (Signed)
Attempted report to inpatient floor x 2. Callback number provided.

## 2020-03-10 NOTE — Discharge Planning (Signed)
RNCM consulted for medication assistance. RNCM reviewed chart and spoke with the pt about [facility] MATCH program ($3 co pay for each Rx through [facility] program, does not include refills, 7 day expiration of MATCH letter and choice of pharmacies). Pt is eligible for [facility] MATCH program (unable to find pt listed in PROCARE per cardholder name inquiry) and has agreed to accept [facility] with co-pay override. PROCARE information entered. MATCH letter completed and faxed to [facility].  RNCM updated EDP and ED RN.

## 2020-03-10 NOTE — H&P (Signed)
Date: 03/10/2020               Patient Name:  Robert [name] MRN: [id number]  DOB: [birth year] Age / Sex: 40 y.o., male   PCP: Patient, No Pcp Per         Medical Service: Internal Medicine Teaching Service         Attending Physician: Dr. Mikey Bussing, Marthenia Rolling, DO    First Contact: Dr. Evie Lacks Pager: [phone]  Second Contact: Dr. Sande Brothers Pager: [phone]       After Hours (After 5p/  First Contact Pager: [phone]  weekends / holidays): Second Contact Pager: [phone]   Chief Complaint: Skin infection  History of Present Illness: 39 y/o male with PMHx of IV drug use, (Cocaine and Meth), MDD, anxiety, Hx of Hep C (untreated), who presented with concern of cellulitis on his extremities. Patient complains of some pain and states that he injects meth and he had the cellulitis on his extremities.  To ED few days ago and discharged home with p.o. antibiotic (clindamycin) after given 1 dose of IV clindamycin in ED.  The patient mentions that he did not take them because he thought he should come back to the hospital to be taking care of because it is getting worse and "the meth was coming out of the wounds on his skin".  He denies any fever or chills.  He also ports some wound and swelling of his lips.  He denies any chest pain, shortness of breath, cough.  He reports some watery diarrhea that resolved.  He denies any nausea vomiting or abdominal pain.   He states that he is vaccinated against COVID and denies any sick contact.  Meds:  Current Meds  Medication Sig  . ARIPiprazole (ABILIFY) 2 MG tablet Take 2 mg by mouth daily.  . clindamycin (CLEOCIN) 150 MG capsule Take 3 capsules (450 mg total) by mouth 3 (three) times daily for 7 days.  . DULoxetine (CYMBALTA) 60 MG capsule Take 60 mg by mouth daily.  . potassium chloride (KLOR-CON) 10 MEQ tablet Take 1 tablet (10 mEq total) by mouth daily.     Allergies: Allergies as of 03/09/2020 - Review Complete 03/07/2020  Allergen Reaction Noted  .  Hydrocodone Other (See Comments) 02/28/2018  . Codeine Other (See Comments) 12/03/2019   Past Medical History:  Diagnosis Date  . Anxiety   . Arthritis   . Cellulitis 09/2018  . Cellulitis and abscess of upper extremity 02/28/2018  . Cellulitis of hand, right 08/07/2019  . Cellulitis of leg, right   . Cellulitis of right hand 08/08/2019  . Chronic hepatitis C (HCC) 08/07/2019  . Depression   . Hepatitis    via pts mother  . 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 Nicotine dependence, cigarettes, uncomplicated 08/07/2019  . Right arm cellulitis 08/07/2019    Family History:  Family History  Problem Relation Age of Onset  . Diabetes Mother   . Diabetes Maternal Grandfather      Social History: Drinks alcohol every day, endorses IV meth and Cocaine use  Review of Systems: A complete ROS was negative except as per HPI.   Physical Exam: Blood pressure 121/73, pulse 84, temperature 98.5 F (36.9 C), temperature source Oral, resp. rate 19, height 5\' 5"  (1.651 m), weight 81 kg, SpO2 100 %. Constitutional: No acute distress HENT:  Head: Normocephalic and atraumatic.  Eyes: Conjunctivae are normal, EOM nl Cardiovascular: RRR, nl S1S2, no murmur,  no LEE Respiratory: Effort normal and breath sounds normal. No respiratory distress. No wheezes.  GI: Soft. Bowel sounds are normal. No distension. There is no tenderness.  Neurological: Is alert and oriented x 3  Skin: Erythema and swelling with non draignig small would and injection marks on Rt UE and bilateral LE. Not diaphoretic Psychiatric: Normal mood, not depressed, behavior is normal.    EKG: Not obtained   Assessment & Plan by Problem: Active Problems:   Cellulitis   Cellulitis   (RUE, RLE, RLE): In setting of IV drug injection. Seen in the ED for the same x3 days ago, Gave 1 dose of IV Clinda and then discharged with PO clindamycin but he did not take it. No systemic sign of  infection. IMTS consulted for admission as he is noncompliant to ABx and high risk for worsening infection.  DVT ruled out last ED visit.  No sign of systemic infection. Purulent? (No discharge now but he reports probable oozing from the wound? He says that he did not take the ABx at home because he thinks it should be taken care of here at hospital.  IV ABx but he will probably can safely switch to PO Abx soon.   No fever, no chills.  No systemic sign of infection, no nausea or vomiting.  No leukocytosis or leukopenia.  Lactic acid normal at 0.9.  Blood sugars 115.  No electrolyte derangement.  -Status post 1 dose of IV clindamycin in ED. Will give Ceftriaxone and Vanc but will likely be able to transition to PO -CBC daily -F/u BC -F/u COVID-test  Alcohol use disorder: Drinks 7 beers a day. Last drink was yesterday. Substance use disorder: Patient injects meth and cocaine. He was agitated in ED and received Ativan.  -UDS -EtOH level -CIWA w Ativan -Oxycodone 5mg  q6h PRN for pain  -COWS score   Elevated transaminase: Hx of untreated Hep C AST 89, ALT 140.  Chronically elevated. Alkaline phosphatase and bilirubin are normal. Hx of Hep C. -2/2 untreated Hep C. LFT unchanged from the past. Encouraged pt to continue follow up outpt for med assist for Hep C tx.    Dispo: Admit patient to Observation with expected length of stay less than 2 midnights.  Signed , MD 03/10/2020, 1:42 PM  Pager: [phone]

## 2020-03-10 NOTE — ED Notes (Signed)
Patient ambulated with steady gait out of ED.

## 2020-03-11 ENCOUNTER — Other Ambulatory Visit: Payer: Self-pay

## 2020-03-11 ENCOUNTER — Emergency Department
Admission: EM | Admit: 2020-03-11 | Discharge: 2020-03-11 | Disposition: A | Discharge status: Home / Self Care | Payer: Self-pay | Attending: Emergency Medicine | Admitting: Emergency Medicine

## 2020-03-11 DIAGNOSIS — F1110 Opioid abuse, uncomplicated: Secondary | ICD-10-CM | POA: Diagnosis not present

## 2020-03-11 DIAGNOSIS — Z20822 Contact with and (suspected) exposure to covid-19: Secondary | ICD-10-CM | POA: Insufficient documentation

## 2020-03-11 DIAGNOSIS — R45851 Suicidal ideations: Secondary | ICD-10-CM | POA: Insufficient documentation

## 2020-03-11 DIAGNOSIS — F17210 Nicotine dependence, cigarettes, uncomplicated: Secondary | ICD-10-CM | POA: Insufficient documentation

## 2020-03-11 DIAGNOSIS — F1510 Other stimulant abuse, uncomplicated: Secondary | ICD-10-CM | POA: Insufficient documentation

## 2020-03-11 LAB — URINE DRUG SCREEN, QUALITATIVE (ARMC ONLY)
Amphetamines, Ur Screen: POSITIVE — AB
Barbiturates, Ur Screen: NOT DETECTED
Benzodiazepine, Ur Scrn: NOT DETECTED
Cannabinoid 50 Ng, Ur ~~LOC~~: POSITIVE — AB
Cocaine Metabolite,Ur ~~LOC~~: POSITIVE — AB
MDMA (Ecstasy)Ur Screen: NOT DETECTED
Methadone Scn, Ur: NOT DETECTED
Opiate, Ur Screen: NOT DETECTED
Phencyclidine (PCP) Ur S: NOT DETECTED
Tricyclic, Ur Screen: NOT DETECTED

## 2020-03-11 LAB — COMPREHENSIVE METABOLIC PANEL
ALT: 127 U/L — ABNORMAL HIGH (ref 0–44)
AST: 80 U/L — ABNORMAL HIGH (ref 15–41)
Albumin: 3.4 g/dL — ABNORMAL LOW (ref 3.5–5.0)
Alkaline Phosphatase: 88 U/L (ref 38–126)
Anion gap: 11 (ref 5–15)
BUN: 6 mg/dL (ref 6–20)
CO2: 29 mmol/L (ref 22–32)
Calcium: 8.8 mg/dL — ABNORMAL LOW (ref 8.9–10.3)
Chloride: 98 mmol/L (ref 98–111)
Creatinine, Ser: 0.59 mg/dL — ABNORMAL LOW (ref 0.61–1.24)
GFR, Estimated: >60 mL/min (ref >60)
Glucose, Bld: 150 mg/dL — ABNORMAL HIGH (ref 70–99)
Potassium: 3.4 mmol/L — ABNORMAL LOW (ref 3.5–5.1)
Sodium: 138 mmol/L (ref 135–145)
Total Bilirubin: 0.6 mg/dL (ref 0.3–1.2)
Total Protein: 7.3 g/dL (ref 6.5–8.1)

## 2020-03-11 LAB — CBC
HCT: 37.2 % — ABNORMAL LOW (ref 39.0–52.0)
Hemoglobin: 12.6 g/dL — ABNORMAL LOW (ref 13.0–17.0)
MCH: 31.3 pg (ref 26.0–34.0)
MCHC: 33.9 g/dL (ref 30.0–36.0)
MCV: 92.5 fL (ref 80.0–100.0)
Platelets: 234 10*3/uL (ref 150–400)
RBC: 4.02 MIL/uL — ABNORMAL LOW (ref 4.22–5.81)
RDW: 12 % (ref 11.5–15.5)
WBC: 5.1 10*3/uL (ref 4.0–10.5)
nRBC: 0 % (ref 0.0–0.2)

## 2020-03-11 LAB — RESP PANEL BY RT-PCR (FLU A&B, COVID) ARPGX2
Influenza A by PCR: NEGATIVE
Influenza B by PCR: NEGATIVE
SARS Coronavirus 2 by RT PCR: NEGATIVE

## 2020-03-11 LAB — SALICYLATE LEVEL: Salicylate Lvl: <7 mg/dL — ABNORMAL LOW (ref 7.0–30.0)

## 2020-03-11 LAB — ETHANOL: Alcohol, Ethyl (B): <10 mg/dL (ref <10)

## 2020-03-11 LAB — ACETAMINOPHEN LEVEL: Acetaminophen (Tylenol), Serum: <10 ug/mL — ABNORMAL LOW (ref 10–30)

## 2020-03-11 NOTE — ED Notes (Signed)
Pt reports having all belonging  Pt ambulatory to lobby with this RN  Pt declining DC VS  No peripheral IV placed this visit.   Discharge instructions reviewed with patient. Questions fielded by this RN. Patient verbalizes understanding of instructions. Patient discharged home in stable condition per Dr Vicente Males. No acute distress noted at time of discharge.

## 2020-03-11 NOTE — ED Notes (Addendum)
Pt here voluntary with c/o being suicidal with plan to overdose, pt points to both arms and c/o "places coming up where I shoot", pt lower upper extremities appears misshapen with bulges, red and pink areas (appearing as lesions/indurated) around hands and forearms, pt reports IV use of "meth, coke, crack and heroin", reports IV use of fentanyl tonight - pt sleepy and attributes sleepiness to drug use  Pt denies HI, pt reports no audio or visual hallucinations

## 2020-03-11 NOTE — ED Notes (Signed)
Psych and TTS in with pt att

## 2020-03-11 NOTE — ED Provider Notes (Signed)
[facility] Emergency Department Provider Note   ____________________________________________   Event Date/Time   First MD Initiated Contact with Patient 03/11/20 0407     (approximate)  I have reviewed the triage vital signs and the nursing notes.   HISTORY  Chief Complaint Suicidal and Addiction Problem    HPI Robert [name] is a 40 y.o. male with a history of opiate abuse, methamphetamine abuse, malingering suicidality, and aggressive behavior in the emergency department who presents stating that he is "having suicidal thoughts" for the last week.  Patient states that he is significant other died recently and this is caused increased stress in his life.  Of note patient is currently being treated for bilateral upper extremity and right lower extremity cellulitis.  Patient has a clindamycin prescription that he is currently taking.  Patient also endorses continued heroin abuse with most recent use of "1 g of fentanyl" just prior to arrival in emergency department.  Patient currently denies any vision changes, tinnitus, difficulty speaking, facial droop, sore throat, chest pain, shortness of breath, abdominal pain, nausea/vomiting/diarrhea, dysuria, or weakness/numbness/paresthesias in any extremity         Past Medical History:  Diagnosis Date  . Anxiety   . Arthritis   . Cellulitis 09/2018  . Cellulitis and abscess of upper extremity 02/28/2018  . Cellulitis of hand, right 08/07/2019  . Cellulitis of leg, right   . Cellulitis of right hand 08/08/2019  . Chronic hepatitis C (HCC) 08/07/2019  . Depression   . Hepatitis    via pts mother  . 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 Nicotine dependence, cigarettes, uncomplicated 08/07/2019  . Right arm cellulitis 08/07/2019    Patient Active Problem List   Diagnosis Date Noted  . Cellulitis 11/07/2019  . Neuropathy 10/12/2019  . Opioid use disorder 09/28/2019   . Chronic hepatitis C (HCC) 08/07/2019  . Nicotine dependence, cigarettes, uncomplicated 08/07/2019  . Cocaine use disorder, moderate, dependence (HCC) 07/07/2017  . 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 Substance induced mood disorder (HCC) 03/08/2017  . Alcoholism (HCC) 09/12/2014    Past Surgical History:  Procedure Laterality Date  . GALLBLADDER SURGERY      Prior to Admission medications   Medication Sig Start Date End Date Taking? Authorizing Provider  clindamycin (CLEOCIN) 150 MG capsule Take 1 capsule (150 mg total) by mouth 4 (four) times daily for 10 days. 03/10/20 03/20/20 Yes Katsadouros, Vasilios, MD  potassium chloride (KLOR-CON) 10 MEQ tablet Take 1 tablet (10 mEq total) by mouth daily. 03/07/20  Yes Mare Ferrari, PA-C  ARIPiprazole (ABILIFY) 2 MG tablet Take 2 mg by mouth daily. Patient not taking: Reported on 03/11/2020    [provider]  DULoxetine (CYMBALTA) 60 MG capsule Take 60 mg by mouth daily. Patient not taking: Reported on 03/11/2020 01/13/20 03/15/20  [provider]    Allergies Hydrocodone and Codeine  Family History  Problem Relation Age of Onset  . Diabetes Mother   . Diabetes Maternal Grandfather     Social History Social History   Tobacco Use  . Smoking status: Current Every Day Smoker    Packs/day: 0.30    Types: Cigarettes, E-cigarettes  . Smokeless tobacco: Never Used  . Tobacco comment: .5 PPD and vaping  Vaping Use  . Vaping Use: Some days  Substance Use Topics  . Alcohol use: Not Currently    Alcohol/week: 0.0 standard drinks  . Drug  use: Not Currently    Types: Cocaine, Heroin, Methamphetamines    Comment: Sober x weeks     Review of Systems Constitutional: No fever/chills Eyes: No visual changes. ENT: No sore throat. Cardiovascular: Denies chest pain. Respiratory: Denies shortness of breath. Gastrointestinal: No abdominal pain.  No nausea, no vomiting.   No diarrhea. Genitourinary: Negative for dysuria. Musculoskeletal: Negative for acute arthralgias Skin: Negative for rash. Neurological: Negative for headaches, weakness/numbness/paresthesias in any extremity Psychiatric:  Positive for suicidal ideation.  Negative for homicidal ideation   ____________________________________________   PHYSICAL EXAM:  VITAL SIGNS: ED Triage Vitals  Enc Vitals Group     BP 03/11/20 0347 116/66     Pulse Rate 03/11/20 0347 77     Resp 03/11/20 0347 16     Temp 03/11/20 0347 97.7 F (36.5 C)     Temp Source 03/11/20 0347 Oral     SpO2 03/11/20 0347 98 %     Weight 03/11/20 0349 178 lb 9.2 oz (81 kg)     Height 03/11/20 0349 5\' 5"  (1.651 m)     Head Circumference --      Peak Flow --      Pain Score 03/11/20 0349 0     Pain Loc --      Pain Edu? --      Excl. in GC? --    Constitutional: Alert and oriented. Well appearing and in no acute distress. Eyes: Conjunctivae are normal. PERRL. Head: Atraumatic. Nose: No congestion/rhinnorhea. Mouth/Throat: Mucous membranes are moist. Neck: No stridor Cardiovascular: Grossly normal heart sounds.  Good peripheral circulation. Respiratory: Normal respiratory effort.  No retractions. Gastrointestinal: Soft and nontender. No distention. Musculoskeletal: No obvious deformities Neurologic:  Normal speech and language. No gross focal neurologic deficits are appreciated. Skin:  Skin is warm and dry.  Bilateral upper extremity erythema and induration circumferentially to the forearms, erythema over right mid medial shin anteriorly Psychiatric: Mood and affect are normal. Speech and behavior are normal.  ____________________________________________   LABS (all labs ordered are listed, but only abnormal results are displayed)  Labs Reviewed  COMPREHENSIVE METABOLIC PANEL - Abnormal; Notable for the following components:      Result Value   Potassium 3.4 (*)    Glucose, Bld 150 (*)    Creatinine, Ser  0.59 (*)    Calcium 8.8 (*)    Albumin 3.4 (*)    AST 80 (*)    ALT 127 (*)    All other components within normal limits  SALICYLATE LEVEL - Abnormal; Notable for the following components:   Salicylate Lvl <7.0 (*)    All other components within normal limits  ACETAMINOPHEN LEVEL - Abnormal; Notable for the following components:   Acetaminophen (Tylenol), Serum <10 (*)    All other components within normal limits  CBC - Abnormal; Notable for the following components:   RBC 4.02 (*)    Hemoglobin 12.6 (*)    HCT 37.2 (*)    All other components within normal limits  RESP PANEL BY RT-PCR (FLU A&B, COVID) ARPGX2  ETHANOL  URINE DRUG SCREEN, QUALITATIVE ([facility] ONLY)    PROCEDURES  Procedure(s) performed (including Critical Care):  Procedures   ____________________________________________   INITIAL IMPRESSION / ASSESSMENT AND PLAN / ED COURSE  As part of my medical decision making, I reviewed the following data within the electronic MEDICAL RECORD NUMBER Nursing notes reviewed and incorporated, Labs reviewed, Old chart reviewed, and Notes from prior ED visits reviewed and incorporated  Thoughts are linear and organized, and patient has no AH, VH, or HI.  Clinically patient displays no overt toxidrome; they are well appearing, with low suspicion for toxic ingestion given history and exam. Thoughts unlikely 2/2 anemia, hypothyroidism, infection, or ICH.  Consult: Psychiatry to evaluate patient for potential hold for danger to self. Disposition: Plan admit to psychiatry for further management of symptoms.  Discharge. Patient evaluated by both emergency and psychiatric professionals and deemed safe at this time for discharge. They have a plan for psychiatric follow up and a safe place to stay with access to physical and emotional support. Malingering Suicidality: Patient expressing overtly passive suicidality only when informed they will not be admitted or permitted certain  medications. Given their history of similar behavior presumably also for secondary gain I have a low suspicion that they are a risk to themselves or others necessitating emergent psychiatric evaluation.      ____________________________________________   FINAL CLINICAL IMPRESSION(S) / ED DIAGNOSES  Final diagnoses:  Suicidal thoughts  Opiate abuse, continuous (HCC)  Methamphetamine abuse [facility])     ED Discharge Orders    None       Note:  This document was prepared using Dragon voice recognition software and may include unintentional dictation errors.   Merwyn Katos, MD 03/11/20 [phone]

## 2020-03-11 NOTE — ED Triage Notes (Signed)
Pt states "I've been having problems with suicidal thoughts" x "a few weeks." Pt states girlfriend recently passed away. Pt endorses taking " a gram of fentanyl" IV tonight. Pt falling asleep during triage.

## 2020-03-11 NOTE — ED Notes (Signed)
Pt reports difficulty urinating, then requested new underwear and pants, UDS collected; pt to bed and requested cola while this RN talking to room 21 pt went to sleep

## 2020-03-11 NOTE — ED Notes (Signed)
Pt belongings placed in two belonging bags and secured at [employer].  Belongings consist of: - Black jacket - Blue and red sweat pants - Blue short sleeve shirt - Black socks - Black and white shoes - Noodles - Monster drink - Marboro cigarettes - 3 lighters

## 2020-03-11 NOTE — BH Assessment (Signed)
Comprehensive Clinical Assessment (CCA) Note  03/11/2020 Robert [name] [id number]  Chief Complaint: Patient is a 40 year old male presenting to [facility] ED voluntarily due to SI. Per triage note Pt states "I've been having problems with suicidal thoughts" x "a few weeks." Pt states girlfriend recently passed away. Pt endorses taking " a gram of fentanyl" IV tonight. Pt falling asleep during triage. Patient was just recently seen at [facility] yesterday 03/10/20 and was being seen for cellulitis, patient was also seen by social work there and was provided with resources for shelters due to patient being homeless and assistance with medications. Patient also presented to [facility] on 03/07/20 for Cellulitis, [facility] on 02/22/20 for Malingering, [facility] on 02/21/20 for Elevated liver enzyme, and 02/12/20 at [facility] for Malingering. During assessment patient appeared under the influence, had difficulty staying awake, was irritable and became belligerent when asking him questions regarding the type of help he needs. Patient reports why he is presenting to the ED "suicidal." Patient continued to fall asleep during the assessment but did report that he is homeless. Patient also reported that he used Heroin, Fentanyl today and Methamphetamine yesterday. Patient denies HI/AH/VH and does not appear to be responding to any internal or external stimuli.  Per Psyc NP Lerry Liner patient does not meet criteria for Inpatient Hospitalization, patient to discharge later this morning once he is more sober. Chief Complaint  Patient presents with  . Suicidal  . Addiction Problem   Visit Diagnosis: Opioid Use Disorder, Amphetamine Use    CCA Screening, Triage and Referral (STR)  Patient Reported Information How did you hear about [place]? Self  Referral name: No data recorded Referral phone number: No data recorded  Whom do you see for routine medical problems? Other  (Comment)  Practice/Facility Name: No data recorded Practice/Facility Phone Number: No data recorded Name of Contact: No data recorded Contact Number: No data recorded Contact Fax Number: No data recorded Prescriber Name: No data recorded Prescriber Address (if known): No data recorded  What Is the Reason for Your Visit/Call Today? Patient presents today for SI  How Long Has This Been Causing You Problems? 1-6 months  What Do You Feel Would Help You the Most Today? Assessment Only; Therapy   Have You Recently Been in Any Inpatient Treatment (Hospital/Detox/Crisis Center/28-Day Program)? No  Name/Location of Program/Hospital:No data recorded How Long Were You There? No data recorded When Were You Discharged? No data recorded  Have You Ever Received Services From [facility] Before? No  Who Do You See at [facility]? No data recorded  Have You Recently Had Any Thoughts About Hurting Yourself? Yes  Are You Planning to Commit Suicide/Harm Yourself At This time? -- (Unknown)   Have you Recently Had Thoughts About Hurting Someone Karolee Ohs? No  Explanation: No data recorded  Have You Used Any Alcohol or Drugs in the Past 24 Hours? Yes  How Long Ago Did You Use Drugs or Alcohol? 0508  What Did You Use and How Much? Heroin, Fentanyl, Methamphetamine   Do You Currently Have a Therapist/Psychiatrist? No  Name of Therapist/Psychiatrist: No data recorded  Have You Been Recently Discharged From Any Office Practice or Programs? No  Explanation of Discharge From Practice/Program: No data recorded    CCA Screening Triage Referral Assessment Type of Contact: Face-to-Face  Is this Initial or Reassessment? No data recorded Date Telepsych consult ordered in [facility]:   (09/17/19)  Time Telepsych consult ordered in [facility]:  No data recorded  Patient Reported Information Reviewed? Yes  Patient Left Without Being Seen? No data recorded Reason for Not Completing Assessment: No data  recorded  Collateral Involvement: No data recorded  Does Patient Have a Court Appointed Legal Guardian? No data recorded Name and Contact of Legal Guardian: -- (n/a)  If Minor and Not Living with Parent(s), Who has Custody? -- (n/a)  Is CPS involved or ever been involved? Never  Is APS involved or ever been involved? Never   Patient Determined To Be At Risk for Harm To Self or Others Based on Review of Patient Reported Information or Presenting Complaint? Yes, for Self-Harm  Method: No data recorded Availability of Means: No data recorded Intent: No data recorded Notification Required: No data recorded Additional Information for Danger to Others Potential: No data recorded Additional Comments for Danger to Others Potential: No data recorded Are There Guns or Other Weapons in Your Home? No data recorded Types of Guns/Weapons: No data recorded Are These Weapons Safely Secured?                            No data recorded Who Could Verify You Are Able To Have These Secured: No data recorded Do You Have any Outstanding Charges, Pending Court Dates, Parole/Probation? No data recorded Contacted To Inform of Risk of Harm To Self or Others: No data recorded  [facility]   Does Patient Present under Involuntary Commitment? No  IVC Papers Initial File Date: No data recorded  [place]   Patient Currently Receiving the Following Services: No data recorded  Determination of Need: Emergent (2 hours)   Options For Referral: No data recorded    CCA Biopsychosocial Intake/Chief Complaint:  Suicidal thoughts  Current Symptoms/Problems: Suicidal thoughts, patietn is also under the influence of substances   Patient Reported Schizophrenia/Schizoaffective Diagnosis in Past: No   Strengths: Unknown  Preferences: Unknown  Abilities: Unknown   Type of Services Patient Feels are Needed: Unknown   Initial Clinical Notes/Concerns:  None   Mental Health Symptoms Depression:  Irritability   Duration of Depressive symptoms: Greater than two weeks   Mania:  None   Anxiety:   None   Psychosis:  None   Duration of Psychotic symptoms: No data recorded  Trauma:  None   Obsessions:  None   Compulsions:  None   Inattention:  None   Hyperactivity/Impulsivity:  N/A   Oppositional/Defiant Behaviors:  None   Emotional Irregularity:  None   Other Mood/Personality Symptoms:  No data recorded   Mental Status Exam Appearance and self-care  Stature:  Average   Weight:  Average weight   Clothing:  Disheveled   Grooming:  Neglected   Cosmetic use:  None   Posture/gait:  Normal   Motor activity:  Slowed   Sensorium  Attention:  Distractible   Concentration:  Scattered   Orientation:  X5   Recall/memory:  Normal   Affect and Mood  Affect:  Appropriate   Mood:  Angry; Irritable   Relating  Eye contact:  Avoided   Facial expression:  Tense   Attitude toward examiner:  Argumentative; Irritable   Thought and Language  Speech flow: Slurred   Thought content:  Appropriate to Mood and Circumstances   Preoccupation:  None   Hallucinations:  None   Organization:  No data recorded  [employer] ServicesExecutive Functions  Fund of Knowledge:  Average   Intelligence:  Average  Abstraction:  Normal   Judgement:  Fair   Dance movement psychotherapist:  Adequate   Insight:  Poor   Decision Making:  Normal   Social Functioning  Social Maturity:  Impulsive   Social Judgement:  "Chief of Staff"   Stress  Stressors:  Surveyor, quantity; Housing   Coping Ability:  Contractor Deficits:  None   Supports:  Support needed     Religion: Religion/Spirituality Are You A Religious Person?: No  Leisure/Recreation: Leisure / Recreation Do You Have Hobbies?: No  Exercise/Diet: Exercise/Diet Do You Exercise?: No Have You Gained or Lost A Significant Amount of Weight in the Past Six Months?: No Do You Follow a  Special Diet?: No Do You Have Any Trouble Sleeping?: No   CCA Employment/Education Employment/Work Situation: Employment / Work Psychologist, occupational Employment situation: Unemployed  Education: Education Is Patient Currently Attending School?: No   CCA Family/Childhood History Family and Relationship History: Family history Marital status: Single Are you sexually active?: No What is your sexual orientation?: Unknown Has your sexual activity been affected by drugs, alcohol, medication, or emotional stress?: Unknown Does patient have children?:  (Unknown)  Childhood History:  Childhood History Additional childhood history information: Unknown Description of patient's relationship with caregiver when they were a child: Unknown Patient's description of current relationship with people who raised him/her: Unknown How were you disciplined when you got in trouble as a child/adolescent?: Unknown Does patient have siblings?:  (UTA) Did patient suffer any verbal/emotional/physical/sexual abuse as a child?:  (UTA) Did patient suffer from severe childhood neglect?:  (UTA) Has patient ever been sexually abused/assaulted/raped as an adolescent or adult?:  (UTA) Was the patient ever a victim of a crime or a disaster?:  (UTA) Witnessed domestic violence?:  (UTA) Has patient been affected by domestic violence as an adult?:  Industrial/product designer)  Child/Adolescent Assessment:     CCA Substance Use Alcohol/Drug Use: Alcohol / Drug Use Pain Medications: See MAR Prescriptions: See MAR Over the Counter: See MAR History of alcohol / drug use?: Yes Substance #1 Name of Substance 1: Heroin Substance #2 Name of Substance 2: Fetanyl Substance #3 Name of Substance 3: Methamphetamine                   ASAM's:  Six Dimensions of Multidimensional Assessment  Dimension 1:  Acute Intoxication and/or Withdrawal Potential:      Dimension 2:  Biomedical Conditions and Complications:      Dimension 3:   Emotional, Behavioral, or Cognitive Conditions and Complications:     Dimension 4:  Readiness to Change:     Dimension 5:  Relapse, Continued use, or Continued Problem Potential:     Dimension 6:  Recovery/Living Environment:     ASAM Severity Score:    ASAM Recommended Level of Treatment:     Substance use Disorder (SUD)    Recommendations for Services/Supports/Treatments:   Per Psyc NP Rashaun Dixon patient does not meet criteria for Inpatient Hospitalization, patient to discharge later this morning once he is more sober.  DSM5 Diagnoses: Patient Active Problem List   Diagnosis Date Noted  . Cellulitis 11/07/2019  . Neuropathy 10/12/2019  . Opioid use disorder 09/28/2019  . Chronic hepatitis C (HCC) 08/07/2019  . Nicotine dependence, cigarettes, uncomplicated 08/07/2019  . Cocaine use disorder, moderate, dependence (HCC) 07/07/2017  . 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 Substance induced mood disorder (HCC) 03/08/2017  . Alcoholism (HCC) 09/12/2014    Patient Centered Plan: Patient is on  the following Treatment Plan(s):  Substance Abuse   Referrals to Alternative Service(s): Referred to Alternative Service(s):   Place:   Date:   Time:    Referred to Alternative Service(s):   Place:   Date:   Time:    Referred to Alternative Service(s):   Place:   Date:   Time:    Referred to Alternative Service(s):   Place:   Date:   Time:     Riniyah Speich A Aliyana Dlugosz, LCAS-A

## 2020-03-11 NOTE — ED Notes (Addendum)
Pt given clothes to dress for discharge, then pt given meal tray and 2 colas as requested, DC instructions discussed and follow up with [facility] explained, pt upset about being discharged and threatening to sue and have his mother "call up here from [place]", pt reports that mother will want to sue as well   pt walked out to lobby and shown phone to use

## 2020-03-11 NOTE — ED Notes (Signed)
Lab at bedside

## 2020-03-11 NOTE — ED Notes (Addendum)
This RN attempted x2 to obtain blood samples without success. Lab called to collect blood samples.

## 2020-03-11 NOTE — Consult Note (Signed)
[facility] Face-to-Face Psychiatry Consult   Reason for Consult:  Psychiatric evaluation Referring Physician:  Dr. Vicente Males Patient Identification: Robert [name] MRN:  [id number] Principal Diagnosis: <principal problem not specified> Diagnosis:  Active Problems:   * No active hospital problems. *   Total Time spent with patient: 45 minutes  Subjective:   Robert [name] is a 40 y.o. male per triage nursPt states "I've been having problems with suicidal thoughts" x "a few weeks." Pt states girlfriend recently passed away. Pt endorses taking " a gram of fentanyl" IV tonight. Pt falling asleep during triage.   HPI: Per TTS, Patient is a 40 year old male presenting to [facility] ED voluntarily due to SI. Per triage note Pt states "I've been having problems with suicidal thoughts"x "a few weeks." Pt states girlfriend recently passed away.Pt endorses taking " a gram of fentanyl" IV tonight. Pt falling asleep during triage. Patient was just recently seen at [facility] yesterday 03/10/20 and was being seen for cellulitis, patient was also seen by social work there and was provided with resources for shelters due to patient being homeless and assistance with medications. Patient also presented to [facility] on 03/07/20 for Cellulitis, [facility] on 02/22/20 for Malingering, [facility] on 02/21/20 for Elevated liver enzyme, and 02/12/20 at [facility] for Malingering. During assessment patient appeared under the influence, had difficulty staying awake, was irritable and became belligerent when asking him questions regarding the type of help he needs. Patient reports why he is presenting to the ED "suicidal." Patient continued to fall asleep during the assessment but did report that he is homeless. Patient also reported that he used Heroin, Fentanyl today and Methamphetamine yesterday. Patient denies HI/AH/VH and does not appear to be responding to any internal or external stimuli.  Recommendation:   patient does not meet criteria for Inpatient Hospitalization, patient to discharge later this morning once he is more sober.  Past Psychiatric History: Poly substance abuse   Risk to Self:  No Risk to Others:  No Prior Inpatient Therapy:   Prior Outpatient Therapy:    Past Medical History:  Past Medical History:  Diagnosis Date  . Anxiety   . Arthritis   . Cellulitis 09/2018  . Cellulitis and abscess of upper extremity 02/28/2018  . Cellulitis of hand, right 08/07/2019  . Cellulitis of leg, right   . Cellulitis of right hand 08/08/2019  . Chronic hepatitis C (HCC) 08/07/2019  . Depression   . Hepatitis    via pts mother  . 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 Nicotine dependence, cigarettes, uncomplicated 08/07/2019  . Right arm cellulitis 08/07/2019    Past Surgical History:  Procedure Laterality Date  . GALLBLADDER SURGERY     Family History:  Family History  Problem Relation Age of Onset  . Diabetes Mother   . Diabetes Maternal Grandfather    Family Psychiatric  History: unknown Social History:  Social History   Substance and Sexual Activity  Alcohol Use Not Currently  . Alcohol/week: 0.0 standard drinks     Social History   Substance and Sexual Activity  Drug Use Not Currently  . Types: Cocaine, Heroin, Methamphetamines   Comment: Sober x weeks     Social History   Socioeconomic History  . Marital status: Single    Spouse name: Not on file  . Number of children: Not on file  . Years of education: Not on file  . Highest education level: Not  on file  Occupational History  . Not on file  Tobacco Use  . Smoking status: Current Every Day Smoker    Packs/day: 0.30    Types: Cigarettes, E-cigarettes  . Smokeless tobacco: Never Used  . Tobacco comment: .5 PPD and vaping  Vaping Use  . Vaping Use: Some days  Substance and Sexual Activity  . Alcohol use: Not Currently    Alcohol/week: 0.0 standard drinks  .  Drug use: Not Currently    Types: Cocaine, Heroin, Methamphetamines    Comment: Sober x weeks   . Sexual activity: Not on file  Other Topics Concern  . Not on file  Social History Narrative  . Not on file   Social Determinants of Health   Financial Resource Strain: Not on file  Food Insecurity: Not on file  Transportation Needs: Not on file  Physical Activity: Not on file  Stress: Not on file  Social Connections: Not on file   Additional Social History:    Allergies:   Allergies  Allergen Reactions  . Hydrocodone Other (See Comments)    nausea  . Codeine Other (See Comments)    Labs:  Results for orders placed or performed during the hospital encounter of 03/11/20 (from the past 48 hour(s))  Urine Drug Screen, Qualitative     Status: Abnormal   Collection Time: 03/11/20  3:54 AM  Result Value Ref Range   Tricyclic, Ur Screen NONE DETECTED NONE DETECTED   Amphetamines, Ur Screen POSITIVE (A) NONE DETECTED   MDMA (Ecstasy)Ur Screen NONE DETECTED NONE DETECTED   Cocaine Metabolite,Ur [place] POSITIVE (A) NONE DETECTED   Opiate, Ur Screen NONE DETECTED NONE DETECTED   Phencyclidine (PCP) Ur S NONE DETECTED NONE DETECTED   Cannabinoid 50 Ng, Ur [place] POSITIVE (A) NONE DETECTED   Barbiturates, Ur Screen NONE DETECTED NONE DETECTED   Benzodiazepine, Ur Scrn NONE DETECTED NONE DETECTED   Methadone Scn, Ur NONE DETECTED NONE DETECTED    Comment: (NOTE) Tricyclics + metabolites, urine    Cutoff 1000 ng/mL Amphetamines + metabolites, urine  Cutoff 1000 ng/mL MDMA (Ecstasy), urine              Cutoff 500 ng/mL Cocaine Metabolite, urine          Cutoff 300 ng/mL Opiate + metabolites, urine        Cutoff 300 ng/mL Phencyclidine (PCP), urine         Cutoff 25 ng/mL Cannabinoid, urine                 Cutoff 50 ng/mL Barbiturates + metabolites, urine  Cutoff 200 ng/mL Benzodiazepine, urine              Cutoff 200 ng/mL Methadone, urine                   Cutoff 300 ng/mL  The urine drug  screen provides only a preliminary, unconfirmed analytical test result and should not be used for non-medical purposes. Clinical consideration and professional judgment should be applied to any positive drug screen result due to possible interfering substances. A more specific alternate chemical method must be used in order to obtain a confirmed analytical result. Gas chromatography / mass spectrometry (GC/MS) is the preferred confirm atory method. Performed at [facility], [address], [place], [place] [address]   Comprehensive metabolic panel     Status: Abnormal   Collection Time: 03/11/20  4:54 AM  Result Value Ref Range   Sodium 138 135 -  145 mmol/L   Potassium 3.4 (L) 3.5 - 5.1 mmol/L   Chloride 98 98 - 111 mmol/L   CO2 29 22 - 32 mmol/L   Glucose, Bld 150 (H) 70 - 99 mg/dL    Comment: Glucose reference range applies only to samples taken after fasting for at least 8 hours.   BUN 6 6 - 20 mg/dL   Creatinine, Ser 4.69 (L) 0.61 - 1.24 mg/dL   Calcium 8.8 (L) 8.9 - 10.3 mg/dL   Total Protein 7.3 6.5 - 8.1 g/dL   Albumin 3.4 (L) 3.5 - 5.0 g/dL   AST 80 (H) 15 - 41 U/L   ALT 127 (H) 0 - 44 U/L   Alkaline Phosphatase 88 38 - 126 U/L   Total Bilirubin 0.6 0.3 - 1.2 mg/dL   GFR, Estimated >62 >95 mL/min    Comment: (NOTE) Calculated using the CKD-EPI Creatinine Equation (2021)    Anion gap 11 5 - 15    Comment: Performed at [facility], [address] [place], [place] [address]  Ethanol     Status: None   Collection Time: 03/11/20  4:54 AM  Result Value Ref Range   Alcohol, Ethyl (B) <10 <10 mg/dL    Comment: (NOTE) Lowest detectable limit for serum alcohol is 10 mg/dL.  For medical purposes only. Performed at [facility], [address] [place], [place] [address]   Salicylate level     Status: Abnormal   Collection Time: 03/11/20  4:54 AM  Result Value Ref Range   Salicylate Lvl <7.0 (L) 7.0 - 30.0 mg/dL    Comment: Performed at  [facility], [address] [place], [place] [address]  Acetaminophen level     Status: Abnormal   Collection Time: 03/11/20  4:54 AM  Result Value Ref Range   Acetaminophen (Tylenol), Serum <10 (L) 10 - 30 ug/mL    Comment: (NOTE) Therapeutic concentrations vary significantly. A range of 10-30 ug/mL  may be an effective concentration for many patients. However, some  are best treated at concentrations outside of this range. Acetaminophen concentrations >150 ug/mL at 4 hours after ingestion  and >50 ug/mL at 12 hours after ingestion are often associated with  toxic reactions.  Performed at [facility], [address] [place], [place] [address]   cbc     Status: Abnormal   Collection Time: 03/11/20  4:54 AM  Result Value Ref Range   WBC 5.1 4.0 - 10.5 K/uL   RBC 4.02 (L) 4.22 - 5.81 MIL/uL   Hemoglobin 12.6 (L) 13.0 - 17.0 g/dL   HCT 03.4 (L) 74.2 - 59.5 %   MCV 92.5 80.0 - 100.0 fL   MCH 31.3 26.0 - 34.0 pg   MCHC 33.9 30.0 - 36.0 g/dL   RDW 63.8 75.6 - 43.3 %   Platelets 234 150 - 400 K/uL   nRBC 0.0 0.0 - 0.2 %    Comment: Performed at [facility], [address], [place], [place] [address]  Resp Panel by RT-PCR (Flu A&B, Covid) Nasopharyngeal Swab     Status: None   Collection Time: 03/11/20  4:55 AM   Specimen: Nasopharyngeal Swab; Nasopharyngeal(NP) swabs in vial transport medium  Result Value Ref Range   SARS Coronavirus 2 by RT PCR NEGATIVE NEGATIVE    Comment: (NOTE) SARS-CoV-2 target nucleic acids are NOT DETECTED.  The SARS-CoV-2 RNA is generally detectable in upper respiratory specimens during the acute phase of infection. The lowest concentration  of SARS-CoV-2 viral copies this assay can detect is 138 copies/mL. A negative result does not preclude SARS-Cov-2 infection and should not be used as the sole basis for treatment or other patient management decisions. A negative result may occur with  improper specimen  collection/handling, submission of specimen other than nasopharyngeal swab, presence of viral mutation(s) within the areas targeted by this assay, and inadequate number of viral copies(<138 copies/mL). A negative result must be combined with clinical observations, patient history, and epidemiological information. The expected result is Negative.  Fact Sheet for Patients:  BloggerCourse.comhttps://www.fda.gov/media/152166/download  Fact Sheet for Healthcare Providers:  SeriousBroker.ithttps://www.fda.gov/media/152162/download  This test is no t yet approved or cleared by the [place] FDA and  has been authorized for detection and/or diagnosis of SARS-CoV-2 by FDA under an Emergency Use Authorization (EUA). This EUA will remain  in effect (meaning this test can be used) for the duration of the COVID-19 declaration under Section 564(b)(1) of the Act, 21 U.S.C.section 360bbb-3(b)(1), unless the authorization is terminated  or revoked sooner.       Influenza A by PCR NEGATIVE NEGATIVE   Influenza B by PCR NEGATIVE NEGATIVE    Comment: (NOTE) The Xpert Xpress SARS-CoV-2/FLU/RSV plus assay is intended as an aid in the diagnosis of influenza from Nasopharyngeal swab specimens and should not be used as a sole basis for treatment. Nasal washings and aspirates are unacceptable for Xpert Xpress SARS-CoV-2/FLU/RSV testing.  Fact Sheet for Patients: BloggerCourse.comhttps://www.fda.gov/media/152166/download  Fact Sheet for Healthcare Providers: SeriousBroker.ithttps://www.fda.gov/media/152162/download  This test is not yet approved or cleared by the [place] FDA and has been authorized for detection and/or diagnosis of SARS-CoV-2 by FDA under an Emergency Use Authorization (EUA). This EUA will remain in effect (meaning this test can be used) for the duration of the COVID-19 declaration under Section 564(b)(1) of the Act, 21 U.S.C. section 360bbb-3(b)(1), unless the authorization is terminated or revoked.  Performed at [facility], [address] [place], [place] [address]     No current facility-administered medications for this encounter.   Current Outpatient Medications  Medication Sig Dispense Refill  . clindamycin (CLEOCIN) 150 MG capsule Take 1 capsule (150 mg total) by mouth 4 (four) times daily for 10 days. 40 capsule 0  . potassium chloride (KLOR-CON) 10 MEQ tablet Take 1 tablet (10 mEq total) by mouth daily. 30 tablet 1  . ARIPiprazole (ABILIFY) 2 MG tablet Take 2 mg by mouth daily. (Patient not taking: Reported on 03/11/2020)    . DULoxetine (CYMBALTA) 60 MG capsule Take 60 mg by mouth daily. (Patient not taking: Reported on 03/11/2020)      Musculoskeletal: Strength & Muscle Tone: within normal limits Gait & Station: unsteady Patient leans: N/A  Psychiatric Specialty Exam: Physical Exam Vitals and nursing note reviewed.  Constitutional:      Appearance: He is ill-appearing and toxic-appearing.  HENT:     Head: Normocephalic and atraumatic.     Nose: Nose normal.  Pulmonary:     Effort: Pulmonary effort is normal.  Musculoskeletal:        General: Normal range of motion.     Cervical back: Normal range of motion.  Neurological:     General: No focal deficit present.     Mental Status: He is disoriented.  Psychiatric:        Attention and Perception: He is inattentive.        Mood and Affect: Mood is elated. Affect is inappropriate.        Speech:  Speech is delayed and slurred.        Behavior: Behavior is uncooperative and agitated.        Judgment: Judgment is inappropriate.     Review of Systems  Psychiatric/Behavioral: Positive for agitation and confusion.  All other systems reviewed and are negative.   Blood pressure 116/66, pulse 77, temperature 97.7 F (36.5 C), temperature source Oral, resp. rate 16, height 5\' 5"  (1.651 m), weight 81 kg, SpO2 98 %.Body mass index is 29.72 kg/m.  General Appearance: Disheveled  Eye Contact:  Poor  Speech:  Pressured and Slurred   Volume:  Decreased  Mood:  Irritable  Affect:  Inappropriate  Thought Process:  Disorganized  Orientation:  Full (Time, Place, and Person)  Thought Content:  Illogical  Suicidal Thoughts:  No  Homicidal Thoughts:  No  Memory:  Recent;   Poor  Judgement:  Impaired  Insight:  Lacking  Psychomotor Activity:  Decreased  Concentration:  Attention Span: Poor  Recall:  F[employer]Fair  Fund of Knowledge:  Fair  Language:  Fair  Akathisia:  NA  Handed:  Right  AIMS (if indicated):     Assets:  Desire for Improvement  ADL's:  Intact  Cognition:  Impaired,  Mild  Sleep:      Disposition: No evidence of imminent risk to self or others at present.   Patient does not meet criteria for psychiatric inpatient admission. Supportive therapy provided about ongoing stressors. Discussed crisis plan, support from social network, calling 911, coming to the Emergency Department, and calling Suicide Hotline.  Jearld Leschashaun M Kharisma Glasner, NP 03/11/2020 6:18 AM

## 2020-03-12 LAB — CULTURE, BLOOD (ROUTINE X 2)
Culture: NO GROWTH
Culture: NO GROWTH

## 2020-03-15 LAB — CULTURE, BLOOD (ROUTINE X 2)
Culture: NO GROWTH
Culture: NO GROWTH
Special Requests: ADEQUATE
Special Requests: ADEQUATE

## 2020-04-13 ENCOUNTER — Telehealth: Payer: Self-pay | Admitting: Pediatric Intensive Care

## 2020-04-13 ENCOUNTER — Ambulatory Visit (HOSPITAL_COMMUNITY)
Admission: EM | Admit: 2020-04-13 | Discharge: 2020-04-13 | Disposition: A | Discharge status: Home / Self Care | Payer: No Payment, Other | Attending: Psychiatry | Admitting: Psychiatry

## 2020-04-13 ENCOUNTER — Other Ambulatory Visit: Payer: Self-pay

## 2020-04-13 ENCOUNTER — Ambulatory Visit: Payer: Self-pay | Admitting: Physician Assistant

## 2020-04-13 VITALS — BP 132/93 | HR 65 | Resp 16

## 2020-04-13 DIAGNOSIS — F1994 Other psychoactive substance use, unspecified with psychoactive substance-induced mood disorder: Secondary | ICD-10-CM

## 2020-04-13 DIAGNOSIS — F1590 Other stimulant use, unspecified, uncomplicated: Secondary | ICD-10-CM | POA: Insufficient documentation

## 2020-04-13 DIAGNOSIS — F1190 Opioid use, unspecified, uncomplicated: Secondary | ICD-10-CM | POA: Insufficient documentation

## 2020-04-13 DIAGNOSIS — F332 Major depressive disorder, recurrent severe without psychotic features: Secondary | ICD-10-CM

## 2020-04-13 MED ORDER — DULOXETINE HCL 60 MG PO CPEP: 60.0000 mg | ORAL_CAPSULE | Freq: Every day | ORAL | 0 refills | Status: DC

## 2020-04-13 MED ORDER — TRAZODONE HCL 50 MG PO TABS: 50.0000 mg | ORAL_TABLET | Freq: Every evening | ORAL | 0 refills | Status: DC | PRN

## 2020-04-13 MED ORDER — GABAPENTIN 300 MG PO CAPS: 300.0000 mg | ORAL_CAPSULE | Freq: Three times a day (TID) | ORAL | 0 refills | Status: DC

## 2020-04-13 NOTE — Discharge Instructions (Addendum)
Patient is instructed prior to discharge to:  Take all medications as prescribed by his/her mental healthcare provider. Report any adverse effects and or reactions from the medicines to his/her outpatient provider promptly. Keep all scheduled appointments, to ensure that you are getting refills on time and to avoid any interruption in your medication.  If you are unable to keep an appointment call to reschedule.  Be sure to follow-up with resources and follow-up appointments provided.  Patient has been instructed & cautioned: To not engage in alcohol and or illegal drug use while on prescription medicines. In the event of worsening symptoms, patient is instructed to call the crisis hotline, 911 and or go to the nearest ED for appropriate evaluation and treatment of symptoms. To follow-up with his/her primary care provider for your other medical issues, concerns and or health care needs.   Substance abuse resources and Residential Options:  [facility] day residential substance abuse facility (not an option if you have active assault charges). [address], [place], [place] [address] Phone: [phone]: Ask for Shayla in admissions to complete intake if interested in pursuing this option.  [facility]: Can get intake scheduled; (not an option if you have active assault charges). [address] [place], [place] ([phone]) Call Mon-Fri.  [facility]): (offers outpatient therapy and intensive outpatient substance abuse therapy).  [address], [place], [place] [address] Phone: [phone]  [facility]: Offers FREE recovery skills classes, support groups, 1:1 Peer Support, and Compeer Classes. [address], , [place] [address] Phone: [phone] (Call to complete intake).   [facility] [address] [place], [place] [address] Phone: [phone] ext [phone]  [facility] provides food,  shelter and other programs and services to the homeless men of [place] through our men's program.  By offering safe shelter, three meals a day, clean clothing, Biblical counseling, financial planning, vocational training, GED/education and employment assistance, we've helped mend the shattered lives of many homeless men since opening in 1974.  We have approximately 267 beds available, with a max of 312 beds including mats for emergency situations and currently house an average of 270 men a night.  Prospective Client Check-In Information Photo ID Required (State/ Out of State/ DOC) - if photo ID is not available, clients are required to have a printout of a police/sheriff's criminal history report. Help out with chores around the Mission. No sex offender of any type (pending, charged, registered and/or any other sex related offenses) will be permitted to check in. Must be willing to abide by all rules, regulations, and policies established by the [facility]. The following will be provided - shelter, food, clothing, and biblical counseling. If you or someone you know is in need of assistance at our men's shelter in [place], [place], please call [phone] ext. [phone].  

## 2020-04-13 NOTE — Telephone Encounter (Signed)
Call from client. He was unable to be seen by a provider at [facility]. He states he was told to return Monday during walk in hours. Client states he would like to be evaluated at mobile clinic and would also like to be re-referred to OUD clinic. CN will provide Benedetto Goad to mobile clinic site this afternoon. Shann Medal RN BSN CNP [phone]

## 2020-04-13 NOTE — ED Provider Notes (Signed)
Behavioral Health Urgent Care Medical Screening Exam  Patient Name: Robert [name] MRN: [id number] Date of Evaluation: 04/13/20 Chief Complaint:   Diagnosis:  Final diagnoses:  Substance induced mood disorder (HCC)    History of Present illness: Robert [name] is a 40 y.o. male.  Patient presents voluntarily for walk-in assessment at [facility].  Patient reports he would like to establish with outpatient psychiatry as well as counseling.  Patient becomes mildly frustrated when I explained that he is in the crisis area and outpatient follow-up is located upstairs but he does continue the assessment.  Patient reports use of heroin and methamphetamine.  Patient reports he has used very little heroin and methamphetamine over the last 4 days states "I just use enough so that I do not get sick or have nausea."  Patient reports he has also bought gabapentin from the street as he "doubled up on the doses."  Patient reports he has a sponsor and attends NA meetings.  Patient reports he completed a [facility]-day program through [facility] about 1 month ago.  Patient reports he does not want to return to substance use treatment as he has established with an employer as well as a sponsor.  Patient reports he has been prescribed Cymbalta, gabapentin and as needed trazodone in the past.  Patient reports he has not seen outpatient psychiatry but has had several inpatient and substance use disorder psychiatry admissions.  Patient reports he has been diagnosed with depression, anxiety, and substance use disorder.  Patient reports recent stressors include the loss of his girlfriend to an overdose of heroin approximately 2 months ago.  Patient reports he is also acclimating to the [facility] where he has recently been admitted.  Patient is also dealing with legal concerns and believes he has a warrant related to missed court dates for paraphernalia and other charges.  Patient resides in  [place] in an [facility] sober living home for approximately 1 week.  Patient denies access to weapons.  Patient is employed Dance movement psychotherapist.  Patient endorses average sleep and appetite.  Patient assessed by nurse practitioner.  Patient denies suicidal and homicidal ideations.  Patient denies any history of suicide attempts.  Patient denies both auditory and visual hallucinations.  Patient offered support and encouragement.  Psychiatric Specialty Exam  Presentation  General Appearance:Appropriate for Environment; Casual  Eye Contact:Good  Speech:Clear and Coherent; Normal Rate  Speech Volume:Normal  Handedness:Right   Mood and Affect  Mood:Anxious  Affect:Appropriate; Congruent   Thought Process  Thought Processes:Coherent; Goal Directed  Descriptions of Associations:Intact  Orientation:Full (Time, Place and Person)  Thought Content:Logical; WDL  Hallucinations:None  Ideas of Reference:None  Suicidal Thoughts:No  Homicidal Thoughts:No   Sensorium  Memory:Immediate Good; Recent Good; Remote Good  Judgment:Fair  Insight:Good   Executive Functions  Concentration:Good  Attention Span:Good  Recall:Good  Fund of Knowledge:Good  Language:Good   Psychomotor Activity  Psychomotor Activity:Normal   Assets  Assets:Communication Skills; Desire for Improvement; Financial Resources/Insurance; Housing; Intimacy; Leisure Time; Physical Health; Resilience; Social Support; Talents/Skills   Sleep  Sleep:Fair  Number of hours: No data recorded  Physical Exam: Physical Exam Vitals and nursing note reviewed.  Constitutional:      Appearance: He is well-developed.  HENT:     Head: Normocephalic.  Cardiovascular:     Rate and Rhythm: Normal rate.  Pulmonary:     Effort: Pulmonary effort is normal.  Neurological:     Mental Status: He is alert and oriented to person,  place, and time.  Psychiatric:        Attention and Perception: Attention  and perception normal.        Mood and Affect: Affect normal. Mood is anxious.        Speech: Speech normal.        Behavior: Behavior normal. Behavior is cooperative.        Thought Content: Thought content normal.        Cognition and Memory: Cognition and memory normal.        Judgment: Judgment normal.    Review of Systems  Constitutional: Negative.   HENT: Negative.   Eyes: Negative.   Respiratory: Negative.   Cardiovascular: Negative.   Gastrointestinal: Negative.   Genitourinary: Negative.   Musculoskeletal: Negative.   Skin: Negative.   Neurological: Negative.   Endo/Heme/Allergies: Negative.   Psychiatric/Behavioral: Positive for substance abuse.   Blood pressure 123/75, pulse 60, temperature 98 F (36.7 C), temperature source Oral, resp. rate 18, height 5\' 5"  (1.651 m), weight 185 lb (83.9 kg), SpO2 100 %. Body mass index is 30.79 kg/m.  Musculoskeletal: Strength & Muscle Tone: within normal limits Gait & Station: normal Patient leans: N/A   [facility] MSE Discharge Disposition for Follow up and Recommendations: Based on my evaluation the patient does not appear to have an emergency medical condition and can be discharged with resources and follow up care in outpatient services for Medication Management and Individual Therapy  Patient reviewed with Dr. . Follow-up with outpatient psychiatry and substance use treatment resources provided.   Bronwen Betters, FNP 04/13/2020, 3:12 PM

## 2020-04-13 NOTE — Telephone Encounter (Signed)
Medication was sent to [facility]. Cari will follow up with Jomarie Longs in two weeks. Will reach out once the schedule is posted.

## 2020-04-13 NOTE — BH Assessment (Signed)
Patient present as referral from [facility] - see note.  He presents reporting worsening depression, mostly related to losing his girlfriend of a heroin overdose 2 months.  Patient has a significant substance use history, heroin and meth.  He states last use was  4 days ago when he snorted heroin.  He has a recent IV drug use history - last IV meth and heroin use 3 months ago.  Significant SA hx since age 40.  Patient reports worsening depression and hopes to get back on his medications, cymbalta, gabapentin and trazadone with plan to follow up with [facility] outpatient clinic. He denies SI, HI and AVH.

## 2020-04-13 NOTE — Progress Notes (Signed)
Established Patient Office Visit  Subjective:  Patient ID: Robert [name], male    DOB: 1980/08/21  Age: 40 y.o. MRN: [id number]  CC:  Chief Complaint  Patient presents with  . Medication Management    HPI Robert [name] reports that he would refills on his behavioral health medications. Reports that he has been working on reducing his substance abuse, states these medications do help with his withdrawal symptoms.  Reports that he is working with congregational nursing and a licensed Visual merchandiser.  Social worker note from 04/13/2020  Patient present as referral from [facility] Nurse Program - see note.  He presents reporting worsening depression, mostly related to losing his girlfriend of a heroin overdose 2 months.  Patient has a significant substance use history, heroin and meth.  He states last use was  4 days ago when he snorted heroin.  He has a recent IV drug use history - last IV meth and heroin use 3 months ago.  Significant SA hx since age 42.  Patient reports worsening depression and hopes to get back on his medications, cymbalta, gabapentin and trazadone with plan to follow up with [facility] outpatient clinic. He denies SI, HI and AVH.  Past Medical History:  Diagnosis Date  . Anxiety   . Arthritis   . Cellulitis 09/2018  . Cellulitis and abscess of upper extremity 02/28/2018  . Cellulitis of hand, right 08/07/2019  . Cellulitis of leg, right   . Cellulitis of right hand 08/08/2019  . Chronic hepatitis C (HCC) 08/07/2019  . Depression   . Hepatitis    via pts mother  . 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 Nicotine dependence, cigarettes, uncomplicated 08/07/2019  . Right arm cellulitis 08/07/2019    Past Surgical History:  Procedure Laterality Date  . GALLBLADDER SURGERY      Family History  Problem Relation Age of Onset  . Diabetes Mother   . Diabetes Maternal Grandfather     Social History    Socioeconomic History  . Marital status: Single    Spouse name: Not on file  . Number of children: Not on file  . Years of education: Not on file  . Highest education level: Not on file  Occupational History  . Not on file  Tobacco Use  . Smoking status: Current Every Day Smoker    Packs/day: 0.30    Types: Cigarettes, E-cigarettes  . Smokeless tobacco: Never Used  . Tobacco comment: .5 PPD and vaping  Vaping Use  . Vaping Use: Some days  Substance and Sexual Activity  . Alcohol use: Not Currently    Alcohol/week: 0.0 standard drinks  . Drug use: Not Currently    Types: Cocaine, Heroin, Methamphetamines    Comment: Sober x weeks   . Sexual activity: Not on file  Other Topics Concern  . Not on file  Social History Narrative  . Not on file   Social Determinants of Health   Financial Resource Strain: Not on file  Food Insecurity: Not on file  Transportation Needs: Not on file  Physical Activity: Not on file  Stress: Not on file  Social Connections: Not on file  Intimate Partner Violence: Not on file    Outpatient Medications Prior to Visit  Medication Sig Dispense Refill  . potassium chloride (KLOR-CON) 10 MEQ tablet Take 1 tablet (10 mEq total) by mouth daily. 30 tablet 1  . ARIPiprazole (ABILIFY) 2 MG tablet Take 2 mg by mouth daily. (  Patient not taking: Reported on 03/11/2020)    . DULoxetine (CYMBALTA) 60 MG capsule Take 60 mg by mouth daily. (Patient not taking: Reported on 03/11/2020)     No facility-administered medications prior to visit.    Allergies  Allergen Reactions  . Hydrocodone Other (See Comments)    nausea  . Codeine Other (See Comments)    ROS Review of Systems  Constitutional: Negative for chills and fever.  HENT: Negative.   Eyes: Negative.   Respiratory: Negative for shortness of breath.   Cardiovascular: Positive for chest pain.  Gastrointestinal: Negative.   Endocrine: Negative.   Genitourinary: Negative.   Musculoskeletal:  Negative.   Skin: Negative.   Allergic/Immunologic: Negative.   Neurological: Negative.   Hematological: Negative.   Psychiatric/Behavioral: Positive for dysphoric mood. Negative for self-injury and suicidal ideas. The patient is not nervous/anxious.       Objective:    Physical Exam Vitals and nursing note reviewed.  Constitutional:      General: He is not in acute distress.    Appearance: Normal appearance. He is not ill-appearing.  HENT:     Head: Normocephalic and atraumatic.     Right Ear: External ear normal.     Left Ear: External ear normal.     Nose: Nose normal.     Mouth/Throat:     Mouth: Mucous membranes are moist.     Pharynx: Oropharynx is clear.  Eyes:     Extraocular Movements: Extraocular movements intact.     Conjunctiva/sclera: Conjunctivae normal.     Pupils: Pupils are equal, round, and reactive to light.  Cardiovascular:     Rate and Rhythm: Normal rate and regular rhythm.     Pulses: Normal pulses.     Heart sounds: Normal heart sounds.  Pulmonary:     Effort: Pulmonary effort is normal.     Breath sounds: Normal breath sounds.  Musculoskeletal:        General: Normal range of motion.     Cervical back: Normal range of motion and neck supple.  Skin:    General: Skin is warm.  Neurological:     Mental Status: He is alert and oriented to person, place, and time.  Psychiatric:        Attention and Perception: Attention normal.        Mood and Affect: Mood and affect normal.        Speech: Speech normal.        Behavior: Behavior normal.        Thought Content: Thought content does not include homicidal or suicidal plan.        Cognition and Memory: Cognition normal.        Judgment: Judgment normal.     BP (!) 132/93   Pulse 65   Resp 16   SpO2 97%  Wt Readings from Last 3 Encounters:  04/13/20 185 lb (83.9 kg)  03/11/20 178 lb 9.2 oz (81 kg)  03/10/20 178 lb 9.2 oz (81 kg)     Health Maintenance Due  Topic Date Due  . COVID-19  Vaccine (2 - Pfizer 3-dose series) 08/23/2019  . INFLUENZA VACCINE  09/26/2019    There are no preventive care reminders to display for this patient.  No results found for: TSH Lab Results  Component Value Date   WBC 5.1 03/11/2020   HGB 12.6 (L) 03/11/2020   HCT 37.2 (L) 03/11/2020   MCV 92.5 03/11/2020   PLT 234 03/11/2020   Lab Results  Component Value Date   NA 138 03/11/2020   K 3.4 (L) 03/11/2020   CO2 29 03/11/2020   GLUCOSE 150 (H) 03/11/2020   BUN 6 03/11/2020   CREATININE 0.59 (L) 03/11/2020   BILITOT 0.6 03/11/2020   ALKPHOS 88 03/11/2020   AST 80 (H) 03/11/2020   ALT 127 (H) 03/11/2020   PROT 7.3 03/11/2020   ALBUMIN 3.4 (L) 03/11/2020   CALCIUM 8.8 (L) 03/11/2020   ANIONGAP 11 03/11/2020   No results found for: CHOL No results found for: HDL No results found for: LDLCALC No results found for: TRIG No results found for: CHOLHDL No results found for: EZMO2H    Assessment & Plan:   Problem List Items Addressed This Visit      Nervous and Auditory   Substance induced mood disorder (HCC)     Other   MDD (major depressive disorder), recurrent severe, without psychosis (HCC) - Primary (Chronic)   Relevant Medications   DULoxetine (CYMBALTA) 60 MG capsule   traZODone (DESYREL) 50 MG tablet   gabapentin (NEURONTIN) 300 MG capsule    1. MDD (major depressive disorder), recurrent severe, without psychosis (HCC) Resume Cymbalta, trazodone, gabapentin. Patient encouraged to continue follow-up with congregational nursing, substance abuse counseling. Patient to follow-up with mobile unit in 2 weeks - DULoxetine (CYMBALTA) 60 MG capsule; Take 1 capsule (60 mg total) by mouth daily.  Dispense: 30 capsule; Refill: 0 - traZODone (DESYREL) 50 MG tablet; Take 1 tablet (50 mg total) by mouth at bedtime as needed for sleep.  Dispense: 30 tablet; Refill: 0 - gabapentin (NEURONTIN) 300 MG capsule; Take 1 capsule (300 mg total) by mouth 3 (three) times daily.  Dispense:  90 capsule; Refill: 0  2. Substance induced mood disorder (HCC)   I have reviewed the patient's medical history (PMH, PSH, Social History, Family History, Medications, and allergies) , and have been updated if relevant. I spent 30 minutes reviewing chart and  face to face time with patient.      Meds ordered this encounter  Medications  . DULoxetine (CYMBALTA) 60 MG capsule    Sig: Take 1 capsule (60 mg total) by mouth daily.    Dispense:  30 capsule    Refill:  0    Order Specific Question:   Supervising Provider    Answer:   Shan Levans E [[id number]]  . traZODone (DESYREL) 50 MG tablet    Sig: Take 1 tablet (50 mg total) by mouth at bedtime as needed for sleep.    Dispense:  30 tablet    Refill:  0    Order Specific Question:   Supervising Provider    Answer:   Delford Field, PATRICK E [[id number]]  . gabapentin (NEURONTIN) 300 MG capsule    Sig: Take 1 capsule (300 mg total) by mouth 3 (three) times daily.    Dispense:  90 capsule    Refill:  0    Order Specific Question:   Supervising Provider    Answer:   Storm Frisk [[id number]]    Follow-up: Return in about 2 weeks (around 04/27/2020).    Kasandra Knudsen Olyvia Gopal, PA-C

## 2020-04-13 NOTE — Telephone Encounter (Signed)
Call from client. States that he is in an [employer]. States that "they have tightened up" on residents taking suboxone. Client has distressed tone and pressured speech. He states that he "doesn't know what to do" as he has run out of antidepressants and gabapentin. States that gabapentin helps ease withdrawal symptoms for him. He states he has been very upset since his significant other overdosed and died. He states he last used heroin "a little over a week ago" before he moved into [employer]. CN advised client go to [facility] for evaluation. CN will provide Benedetto Goad through hospital transportation. CN advised client that any prescribed medications that are not available to him at [facility] can be prescribed to [facility]. CN advised client to make sure that he tells provider that medication can be sent to [facility]. CN also advised client to call after he has been evaluated. Shann Medal Rn BSN CNP [phone]

## 2020-04-13 NOTE — Patient Instructions (Signed)
You will resume the Cymbalta 60 mg, trazodone 50 mg, and gabapentin 300 mg 3 times a day.  You will follow-up with me in the mobile medicine unit in 2 weeks.  Please let us know if there is anything else we can do for you in the meantime.  Roney Jaffe, PA-C Physician Assistant [facility] [email or web address]    Managing Loss, Adult People experience loss in many different ways throughout their lives. Events such as moving, changing jobs, and losing friends can create a sense of loss. The loss may be as serious as a major health change, divorce, death of a pet, or death of a loved one. All of these types of loss are likely to create a physical and emotional reaction known as grief. Grief is the result of a major change or an absence of something or someone that you count on. Grief is a normal reaction to loss. A variety of factors can affect your grieving experience, including:  The nature of your loss.  Your relationship to what or whom you lost.  Your understanding of grief and how to manage it.  Your support system. How to manage lifestyle changes Keep to your normal routine as much as possible.  If you have trouble focusing or doing normal activities, it is acceptable to take some time away from your normal routine.  Spend time with friends and loved ones.  Eat a healthy diet, get plenty of sleep, and rest when you feel tired.   How to recognize changes  The way that you deal with your grief will affect your ability to function as you normally do. When grieving, you may experience these changes:  Numbness, shock, sadness, anxiety, anger, denial, and guilt.  Thoughts about death.  Unexpected crying.  A physical sensation of emptiness in your stomach.  Problems sleeping and eating.  Tiredness (fatigue).  Loss of interest in normal activities.  Dreaming about or imagining seeing the person who died.  A need  to remember what or whom you lost.  Difficulty thinking about anything other than your loss for a period of time.  Relief. If you have been expecting the loss for a while, you may feel a sense of relief when it happens. Follow these instructions at home: Activity Express your feelings in healthy ways, such as:  Talking with others about your loss. It may be helpful to find others who have had a similar loss, such as a support group.  Writing down your feelings in a journal.  Doing physical activities to release stress and emotional energy.  Doing creative activities like painting, sculpting, or playing or listening to music.  Practicing resilience. This is the ability to recover and adjust after facing challenges. Reading some resources that encourage resilience may help you to learn ways to practice those behaviors.   General instructions  Be patient with yourself and others. Allow the grieving process to happen, and remember that grieving takes time. ? It is likely that you may never feel completely done with some grief. You may find a way to move on while still cherishing memories and feelings about your loss. ? Accepting your loss is a process. It can take months or longer to adjust.  Keep all follow-up visits as told by your health care provider. This is important. Where to find support To get support for managing loss:  Ask your health care provider for help and recommendations, such as grief counseling or therapy.  Think about joining  a support group for people who are managing a loss. Where to find more information You can find more information about managing loss from:  American Society of Clinical Oncology: www.cancer.net  American Psychological Association: [employer] Contact a health care provider if:  Your grief is extreme and keeps getting worse.  You have ongoing grief that does not improve.  Your body shows symptoms of grief, such as illness.  You feel  depressed, anxious, or lonely. Get help right away if:  You have thoughts about hurting yourself or others. If you ever feel like you may hurt yourself or others, or have thoughts about taking your own life, get help right away. You can go to your nearest emergency department or call:  Your local emergency services (911 in the [place]  A suicide crisis helpline, such as the National Suicide Prevention Lifeline at 206 421 8057. This is open 24 hours a day. Summary  Grief is the result of a major change or an absence of someone or something that you count on. Grief is a normal reaction to loss.  The depth of grief and the period of recovery depend on the type of loss and your ability to adjust to the change and process your feelings.  Processing grief requires patience and a willingness to accept your feelings and talk about your loss with people who are supportive.  It is important to find resources that work for you and to realize that people experience grief differently. There is not one grieving process that works for everyone in the same way.  Be aware that when grief becomes extreme, it can lead to more severe issues like isolation, depression, anxiety, or suicidal thoughts. Talk with your health care provider if you have any of these issues. This information is not intended to replace advice given to you by your health care provider. Make sure you discuss any questions you have with your health care provider. Document Revised: 08/05/2019 Document Reviewed: 08/05/2019 Elsevier Patient Education  2021 [employer].

## 2020-04-13 NOTE — Progress Notes (Signed)
Request refill on   Gabapentin 300 mg  Tid- request increase on dose   Duloxetine 60 mg   Trazadone 50 mg

## 2020-04-17 ENCOUNTER — Telehealth: Payer: Self-pay

## 2020-04-17 NOTE — Telephone Encounter (Signed)
pls contact victoria [phone]

## 2020-04-18 ENCOUNTER — Ambulatory Visit (INDEPENDENT_AMBULATORY_CARE_PROVIDER_SITE_OTHER): Payer: Self-pay | Admitting: Internal Medicine

## 2020-04-18 ENCOUNTER — Telehealth: Payer: Self-pay | Admitting: Pediatric Intensive Care

## 2020-04-18 ENCOUNTER — Other Ambulatory Visit: Payer: Self-pay

## 2020-04-18 DIAGNOSIS — F1190 Opioid use, unspecified, uncomplicated: Secondary | ICD-10-CM

## 2020-04-18 MED ORDER — BUPRENORPHINE HCL-NALOXONE HCL 8-2 MG SL FILM: 1.0000 | ORAL_FILM | Freq: Two times a day (BID) | SUBLINGUAL | 0 refills | Status: AC

## 2020-04-18 NOTE — Progress Notes (Signed)
   04/18/2020  Robert [name] presents for follow up of opioid use disorder I have reviewed the prior induction visit, follow up visits, and telephone encounters relevant to opiate use disorder (OUD) treatment.   Current daily dose: Suboxone 8-2 mg BID  Date of Induction: 09/28/2019  Current follow up interval, in weeks: 2  HPI:   Robert [name] is here today to reestablish care with the OUD clinic.  Robert [name] states that he was lost to follow-up after obtaining a new job.  This new job did not want him to take Tuesdays off, so he missed his appointment.  He was unaware that he can call the clinic in his situation.  After missing his appointment not having Suboxone for a couple weeks, he relapsed.  Since then he has continued to use several times a week.  His last use was approximately 1 week ago.  Robert [name] states that in the past couple months, he is also struggled with the loss of his girlfriend who unexpectedly passed away 2 months ago.  Feels like he has a good support system amongst his friend group.  He also has his grandfather nearby and speaks to his mother over the telephone frequently.  He has established with behavioral center where he was able to restart his duloxetine, trazodone, gabapentin.  Exam:   Vitals:   04/18/20 0847  BP: 123/72  Pulse: 64  Temp: 98.4 F (36.9 C)  TempSrc: Oral  SpO2: 98%  Weight: 198 lb (89.8 kg)   Physical Exam Vitals and nursing note reviewed.  Constitutional:      General: He is not in acute distress.    Appearance: He is normal weight.  HENT:     Head: Normocephalic and atraumatic.  Eyes:     Conjunctiva/sclera: Conjunctivae normal.     Pupils: Pupils are equal, round, and reactive to light.  Cardiovascular:     Rate and Rhythm: Normal rate and regular rhythm.     Heart sounds: No murmur heard.   Pulmonary:     Effort: Pulmonary effort is normal. No respiratory distress.     Breath sounds: No wheezing or rales.  Abdominal:     General:  There is no distension.     Palpations: Abdomen is soft.     Tenderness: There is no abdominal tenderness.  Skin:    General: Skin is warm and dry.  Neurological:     General: No focal deficit present.     Mental Status: He is alert and oriented to person, place, and time. Mental status is at baseline.  Psychiatric:        Mood and Affect: Mood normal.        Behavior: Behavior normal.        Thought Content: Thought content normal.        Judgment: Judgment normal.    Assessment/Plan:  See Problem Based Charting in the Encounters Tab  Verdene Lennert, MD  04/18/2020  5:37 PM

## 2020-04-18 NOTE — Telephone Encounter (Signed)
Returned call-no answer, message left on recorder.Robert Spittle Cassady2/22/202211:21 AM

## 2020-04-18 NOTE — Assessment & Plan Note (Signed)
Robert [name] is here to reinitiate Suboxone therapy.  His last heroin use was approximately 1 week ago, so he denies undergoing any withdrawal symptoms at this time.  Physical examination is consistent with no withdrawal signs.  Robert [name] is endorsing cravings that make him feel physically ill at times though, specifically upset stomach and nausea.  Previously he was prescribed 8-2 mg twice daily and felt that worked well for him.  We will reinitiate with the same dose.  We went over the contract again today and have signed a new copy to his chart.  Robert [name] did not have any questions.  -Tox screen at next visit -Suboxone 8-2 mg twice daily -2-week follow-up

## 2020-04-18 NOTE — Patient Instructions (Signed)
It was nice seeing you today! Thank you for choosing [facility].    Today we talked about: Restarting Suboxone treatment. I am happy you have chosen to try again! We will send in a prescription for Sub[facility]one to [facility]. We will resume your previous dosing, which is twice daily. Below are instructions on how to take the films to ensure maximum absorption.   When it's time to take your dose 1. Make sure your mouth is empty of everything (no candy/gum/etc) 2. Sit or stand, but do not lie down 3. Swallow a sip of water to wet your mouth  4. Put the half of the tablet or film under your tongue. Do not suck or swallow it. It must stay there until it is completely dissolved. Try to not even swallow your spit during this time. Anything that you swallow will not make you feel better. Try not to talk for at least 5 minutes.   If you have any questions or concerns at any time call your healthcare team.  Clinic Number:  830am - 5pm: [phone] After Hours Number: [phone] - - Leave your number and expect a call back from a physician.

## 2020-04-18 NOTE — Telephone Encounter (Signed)
[facility]. Client received 2 week supply 8-2mg  bid. CN advised client to follow up at [facility] next week as well as Mobile Clinic. Client is worried about accumulating medial debt. CN will assist with setting up an appointment for CFA through the [facility] counselor. Client states that he feels much better, no cravings. CN advised client of hours of avalability this week. Shann Medal RN BSN CNP [phone]

## 2020-04-20 NOTE — Progress Notes (Signed)
Internal Medicine Clinic Attending  Case discussed with Dr. Basaraba  At the time of the visit.  We reviewed the resident's history and exam and pertinent patient test results.  I agree with the assessment, diagnosis, and plan of care documented in the resident's note.  

## 2020-04-24 ENCOUNTER — Telehealth: Payer: Self-pay | Admitting: Pediatric Intensive Care

## 2020-04-24 NOTE — Telephone Encounter (Signed)
Call to client- ID[facility]. CN advised client that she has set up ride for [facility] walk in hours tomorrow morning. ETA for ride is 0700. CN advised she is checking on schedule for mobile clinic for follow up appointment. Shann Medal Rn BSN CNP [phone]

## 2020-04-25 ENCOUNTER — Ambulatory Visit: Payer: Self-pay | Admitting: Physician Assistant

## 2020-04-25 VITALS — BP 115/76 | HR 65 | Temp 98.7°F | Resp 18 | Ht 65.0 in | Wt 196.0 lb

## 2020-04-25 DIAGNOSIS — J302 Other seasonal allergic rhinitis: Secondary | ICD-10-CM

## 2020-04-25 DIAGNOSIS — Z23 Encounter for immunization: Secondary | ICD-10-CM

## 2020-04-25 DIAGNOSIS — F332 Major depressive disorder, recurrent severe without psychotic features: Secondary | ICD-10-CM

## 2020-04-25 DIAGNOSIS — B182 Chronic viral hepatitis C: Secondary | ICD-10-CM

## 2020-04-25 MED ORDER — FLUTICASONE PROPIONATE 50 MCG/ACT NA SUSP: 2.0000 | Freq: Every day | NASAL | 6 refills | Status: AC

## 2020-04-25 MED ORDER — TRAZODONE HCL 50 MG PO TABS: 50.0000 mg | ORAL_TABLET | Freq: Every evening | ORAL | 0 refills | Status: AC | PRN

## 2020-04-25 MED ORDER — DULOXETINE HCL 60 MG PO CPEP: 60.0000 mg | ORAL_CAPSULE | Freq: Every day | ORAL | 0 refills | Status: AC

## 2020-04-25 MED ORDER — CETIRIZINE HCL 10 MG PO TABS: 10.0000 mg | ORAL_TABLET | Freq: Every day | ORAL | 11 refills | Status: AC

## 2020-04-25 MED ORDER — ARIPIPRAZOLE 2 MG PO TABS: 2.0000 mg | ORAL_TABLET | Freq: Every day | ORAL | 0 refills | Status: AC

## 2020-04-25 MED ORDER — GABAPENTIN 300 MG PO CAPS: 300.0000 mg | ORAL_CAPSULE | Freq: Three times a day (TID) | ORAL | 0 refills | Status: AC

## 2020-04-25 NOTE — Progress Notes (Signed)
Established Patient Office Visit  Subjective:  Patient ID: Robert [name], male    DOB: 09-01-1980  Age: 40 y.o. MRN: [id number]  CC:  Chief Complaint  Patient presents with  . Follow-up    HPI [name] states that he was restarted on suboxone, has his follow up appointment next week.  States that he has been compliant to his medications, but continues to have depressed moods.  States that he was previously on Abilify and believes that it offered relief.  States trazodone  is offering relief of insomnia  States that he was not able to attend behavioral health appointment, but does plan to attend the next  Reports that he has issues with his 'sinuses year-round" states that he has recently been having drainage, loss of smell, sinus pressure.  He has previously tried Claritin with some relief.  Any other URI symptoms  Reports that he has had 2 Covid vaccines, believes them both to be Pfizer, states that his last one was in June 2021.   Past Medical History:  Diagnosis Date  . Anxiety   . Arthritis   . Cellulitis 09/2018  . Cellulitis and abscess of upper extremity 02/28/2018  . Cellulitis of hand, right 08/07/2019  . Cellulitis of leg, right   . Cellulitis of right hand 08/08/2019  . Chronic hepatitis C (HCC) 08/07/2019  . Depression   . Hepatitis    via pts mother  . 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 Nicotine dependence, cigarettes, uncomplicated 08/07/2019  . Right arm cellulitis 08/07/2019    Past Surgical History:  Procedure Laterality Date  . GALLBLADDER SURGERY      Family History  Problem Relation Age of Onset  . Diabetes Mother   . Diabetes Maternal Grandfather     Social History   Socioeconomic History  . Marital status: Single    Spouse name: Not on file  . Number of children: Not on file  . Years of education: Not on file  . Highest education level: Not on file  Occupational History  . Not on file   Tobacco Use  . Smoking status: Current Every Day Smoker    Packs/day: 0.50    Types: Cigarettes, E-cigarettes  . Smokeless tobacco: Never Used  . Tobacco comment: .5 PPD and vaping  Vaping Use  . Vaping Use: Some days  Substance and Sexual Activity  . Alcohol use: Not Currently    Alcohol/week: 0.0 standard drinks  . Drug use: Not Currently    Types: Cocaine, Heroin, Methamphetamines    Comment: Sober x weeks   . Sexual activity: Not Currently  Other Topics Concern  . Not on file  Social History Narrative  . Not on file   Social Determinants of Health   Financial Resource Strain: Not on file  Food Insecurity: Not on file  Transportation Needs: Not on file  Physical Activity: Not on file  Stress: Not on file  Social Connections: Not on file  Intimate Partner Violence: Not on file    Outpatient Medications Prior to Visit  Medication Sig Dispense Refill  . Buprenorphine HCl-Naloxone HCl 8-2 MG FILM Place 1 Film under the tongue in the morning and at bedtime. 28 each 0  . potassium chloride (KLOR-CON) 10 MEQ tablet Take 1 tablet (10 mEq total) by mouth daily. 30 tablet 1  . DULoxetine (CYMBALTA) 60 MG capsule Take 1 capsule (60 mg total) by mouth daily. 30 capsule 0  . gabapentin (NEURONTIN)  300 MG capsule Take 1 capsule (300 mg total) by mouth 3 (three) times daily. 90 capsule 0  . traZODone (DESYREL) 50 MG tablet Take 1 tablet (50 mg total) by mouth at bedtime as needed for sleep. 30 tablet 0   No facility-administered medications prior to visit.    Allergies  Allergen Reactions  . Hydrocodone Other (See Comments)    nausea  . Codeine Other (See Comments)    ROS Review of Systems  Constitutional: Negative for chills and fever.  HENT: Positive for congestion, postnasal drip and sinus pressure. Negative for sore throat and trouble swallowing.   Eyes: Negative.   Respiratory: Negative for cough and shortness of breath.   Gastrointestinal: Negative for abdominal  pain, nausea and vomiting.  Endocrine: Negative.   Genitourinary: Negative.   Musculoskeletal: Negative for myalgias.  Skin: Negative.   Allergic/Immunologic: Negative.   Neurological: Negative for headaches.  Hematological: Negative.   Psychiatric/Behavioral: Positive for dysphoric mood. Negative for self-injury, sleep disturbance and suicidal ideas.      Objective:    Physical Exam Vitals and nursing note reviewed.  Constitutional:      Appearance: Normal appearance.  HENT:     Head: Normocephalic and atraumatic.     Right Ear: Tympanic membrane, ear canal and external ear normal.     Left Ear: Tympanic membrane, ear canal and external ear normal.     Nose: Nose normal.     Mouth/Throat:     Mouth: Mucous membranes are moist.     Pharynx: Oropharynx is clear.  Eyes:     Extraocular Movements: Extraocular movements intact.     Conjunctiva/sclera: Conjunctivae normal.     Pupils: Pupils are equal, round, and reactive to light.  Cardiovascular:     Rate and Rhythm: Normal rate and regular rhythm.     Pulses: Normal pulses.     Heart sounds: Normal heart sounds.  Pulmonary:     Effort: Pulmonary effort is normal.     Breath sounds: Normal breath sounds.  Musculoskeletal:        General: Normal range of motion.     Cervical back: Normal range of motion and neck supple.  Lymphadenopathy:     Cervical: No cervical adenopathy.  Skin:    General: Skin is warm and dry.  Neurological:     General: No focal deficit present.     Mental Status: He is alert and oriented to person, place, and time.  Psychiatric:        Mood and Affect: Mood normal.        Behavior: Behavior normal.        Thought Content: Thought content normal.        Judgment: Judgment normal.     BP 115/76 (BP Location: Left Arm, Patient Position: Sitting, Cuff Size: Normal)   Pulse 65   Temp 98.7 F (37.1 C) (Oral)   Resp 18   Ht 5\' 5"  (1.651 m)   Wt 196 lb (88.9 kg)   SpO2 97%   BMI 32.62 kg/m   Wt Readings from Last 3 Encounters:  04/25/20 196 lb (88.9 kg)  04/18/20 198 lb (89.8 kg)  03/11/20 178 lb 9.2 oz (81 kg)     There are no preventive care reminders to display for this patient.  There are no preventive care reminders to display for this patient.  No results found for: TSH Lab Results  Component Value Date   WBC 5.1 03/11/2020   HGB 12.6 (L)  03/11/2020   HCT 37.2 (L) 03/11/2020   MCV 92.5 03/11/2020   PLT 234 03/11/2020   Lab Results  Component Value Date   NA 138 03/11/2020   K 3.4 (L) 03/11/2020   CO2 29 03/11/2020   GLUCOSE 150 (H) 03/11/2020   BUN 6 03/11/2020   CREATININE 0.59 (L) 03/11/2020   BILITOT 0.6 03/11/2020   ALKPHOS 88 03/11/2020   AST 80 (H) 03/11/2020   ALT 127 (H) 03/11/2020   PROT 7.3 03/11/2020   ALBUMIN 3.4 (L) 03/11/2020   CALCIUM 8.8 (L) 03/11/2020   ANIONGAP 11 03/11/2020   No results found for: CHOL No results found for: HDL No results found for: LDLCALC No results found for: TRIG No results found for: CHOLHDL No results found for: NGEX5M    Assessment & Plan:   Problem List Items Addressed This Visit      Digestive   Chronic hepatitis C (HCC)   Relevant Orders   Ambulatory referral to Infectious Disease     Other   MDD (major depressive disorder), recurrent severe, without psychosis (HCC) - Primary (Chronic)   Relevant Medications   ARIPiprazole (ABILIFY) 2 MG tablet   DULoxetine (CYMBALTA) 60 MG capsule   gabapentin (NEURONTIN) 300 MG capsule   traZODone (DESYREL) 50 MG tablet   Seasonal allergies   Relevant Medications   fluticasone (FLONASE) 50 MCG/ACT nasal spray   cetirizine (ZYRTEC) 10 MG tablet   Other Relevant Orders   POC COVID-19 (Completed)   Influenza A/B (Completed)    Other Visit Diagnoses    Need for immunization against influenza       Relevant Orders   Flu Vaccine QUAD 36+ mos IM (Completed)   Encounter for immunization       Relevant Orders   Moderna Covid-19 Booster (Completed)      1. MDD (major depressive disorder), recurrent severe, without psychosis (HCC) Resume Abilify 2 mg, continue current regimen.  Patient to follow-up with behavioral health for medication management.  Patient given appt to establish care at [facility], is working with congregational nursing for help with prescription and obtaining financial assistance. - ARIPiprazole (ABILIFY) 2 MG tablet; Take 1 tablet (2 mg total) by mouth at bedtime.  Dispense: 30 tablet; Refill: 0 - DULoxetine (CYMBALTA) 60 MG capsule; Take 1 capsule (60 mg total) by mouth daily.  Dispense: 30 capsule; Refill: 0 - gabapentin (NEURONTIN) 300 MG capsule; Take 1 capsule (300 mg total) by mouth 3 (three) times daily.  Dispense: 90 capsule; Refill: 0 - traZODone (DESYREL) 50 MG tablet; Take 1 tablet (50 mg total) by mouth at bedtime as needed for sleep.  Dispense: 30 tablet; Refill: 0  2. Chronic hepatitis C without hepatic coma (HCC)  - Ambulatory referral to Infectious Disease  3. Seasonal allergies Rapid Covid negative, flu negative.  Trial Zyrtec, Flonase, patient education given on allergy avoidance - fluticasone (FLONASE) 50 MCG/ACT nasal spray; Place 2 sprays into both nostrils daily.  Dispense: 16 g; Refill: 6 - cetirizine (ZYRTEC) 10 MG tablet; Take 1 tablet (10 mg total) by mouth daily.  Dispense: 30 tablet; Refill: 11  4. Need for immunization against influenza  - Flu Vaccine QUAD 36+ mos IM  5. Encounter for immunization  - Moderna Covid-19 Booster  Meds ordered this encounter  Medications  . fluticasone (FLONASE) 50 MCG/ACT nasal spray    Sig: Place 2 sprays into both nostrils daily.    Dispense:  16 g    Refill:  6  [facility]    Order Specific Question:   Supervising Provider    Answer:   Storm FriskWRIGHT, PATRICK E [[id number]]  . cetirizine (ZYRTEC) 10 MG tablet    Sig: Take 1 tablet (10 mg total) by mouth daily.    Dispense:  30 tablet    Refill:  11    [facility]    Order  Specific Question:   Supervising Provider    Answer:   Shan LevansWRIGHT, PATRICK E [[id number]]  . ARIPiprazole (ABILIFY) 2 MG tablet    Sig: Take 1 tablet (2 mg total) by mouth at bedtime.    Dispense:  30 tablet    Refill:  0    [facility]    Order Specific Question:   Supervising Provider    Answer:   Storm FriskWRIGHT, PATRICK E [[id number]]  . DULoxetine (CYMBALTA) 60 MG capsule    Sig: Take 1 capsule (60 mg total) by mouth daily.    Dispense:  30 capsule    Refill:  0    Order Specific Question:   Supervising Provider    Answer:   Shan LevansWRIGHT, PATRICK E [[id number]]  . gabapentin (NEURONTIN) 300 MG capsule    Sig: Take 1 capsule (300 mg total) by mouth 3 (three) times daily.    Dispense:  90 capsule    Refill:  0    Order Specific Question:   Supervising Provider    Answer:   Shan LevansWRIGHT, PATRICK E [[id number]]  . traZODone (DESYREL) 50 MG tablet    Sig: Take 1 tablet (50 mg total) by mouth at bedtime as needed for sleep.    Dispense:  30 tablet    Refill:  0    Order Specific Question:   Supervising Provider    Answer:   Storm FriskWRIGHT, PATRICK E [[id number]]   I have reviewed the patient's medical history (PMH, PSH, Social History, Family History, Medications, and allergies) , and have been updated if relevant. I spent 30 minutes reviewing chart and  face to face time with patient.     Follow-up: Return if symptoms worsen or fail to improve.    Kasandra Knudsenari S Mayers, PA-C

## 2020-04-25 NOTE — Progress Notes (Signed)
Patient has taken medication and has eaten today. ?Patient denies pain at this time. ? ?

## 2020-04-25 NOTE — Patient Instructions (Addendum)
You will resume Abilify 2 mg at bedtime.  I sent a refill of your other medications to your pharmacy, behavioral health will take over management of these medications.  I encourage you to take Zyrtec on a daily basis, use Flonase as needed for your seasonal allergies.  I have started a referral for you to be seen by infectious disease for hepatitis C treatment.  Please let us know if there is anything else we can do for you  Roney Jaffe, PA-C Physician Assistant [facility] [email or web address]    Allergies, Adult An allergy is a condition in which the body's defense system (immune system) comes in contact with an allergen and reacts to it. An allergen is anything that causes an allergic reaction. Allergens cause the immune system to make proteins for fighting infections (antibodies). These antibodies cause cells to release chemicals called histamines that set off the symptoms of an allergic reaction. Allergies often affect the nasal passages (allergic rhinitis), eyes (allergic conjunctivitis), skin (atopic dermatitis), and stomach. Allergies can be mild, moderate, or severe. They cannot spread from person to person. Allergies can develop at any age and may be outgrown. What are the causes? This condition is caused by allergens. Common allergens include:  Outdoor allergens, such as pollen, car fumes, and mold.  Indoor allergens, such as dust, smoke, mold, and pet dander.  Other allergens, such as foods, medicines, scents, insect bites or stings, and other skin irritants. What increases the risk? You are more likely to develop this condition if you have:  Family members with allergies.  Family members who have any condition that may be caused by allergens, such as asthma. This may make you more likely to have other allergies. What are the signs or symptoms? Symptoms of this condition depend on the severity of the allergy. Mild  to moderate symptoms  Runny nose, stuffy nose (nasal congestion), or sneezing.  Itchy mouth, ears, or throat.  A feeling of mucus dripping down the back of your throat (postnasal drip).  Sore throat.  Itchy, red, watery, or puffy eyes.  Skin rash, or itchy, red, swollen areas of skin (hives).  Stomach cramps or bloating. Severe symptoms Severe allergies to food, medicine, or insect bites may cause anaphylaxis, which can be life-threatening. Symptoms include:  A red (flushed) face.  Wheezing or coughing.  Swollen lips, tongue, or mouth.  Tight or swollen throat.  Chest pain or tightness, or rapid heartbeat.  Trouble breathing or shortness of breath.  Pain in the abdomen, vomiting, or diarrhea.  Dizziness or fainting. How is this diagnosed? This condition is diagnosed based on your symptoms, your family and medical history, and a physical exam. You may also have tests, including:  Skin tests to see how your skin reacts to allergens that may be causing your symptoms. Tests include: ? Skin prick test. For this test, an allergen is introduced to your body through a small opening in the skin. ? Intradermal skin test. For this test, a small amount of allergen is injected under the first layer of your skin. ? Patch test. For this test, a small amount of allergen is placed on your skin. The area is covered and then checked after a few days.  Blood tests.  A challenge test. For this test, you will eat or breathe in a small amount of allergen to see if you have an allergic reaction. You may also be asked to:  Keep a food diary. This is a record of all  the foods, drinks, and symptoms you have in a day.  Try an elimination diet. To do this: ? Remove certain foods from your diet. ? Add those foods back one by one to find out if any foods cause an allergic reaction. How is this treated? Treatment for allergies depends on your symptoms. Treatment may include:  Cold, wet cloths  (cold compresses) to soothe itching and swelling.  Eye drops or nasal sprays.  Nasal irrigation to help clear your mucus or keep the nasal passages moist.  A humidifier to add moisture to the air.  Skin creams to treat rashes or itching.  Oral antihistamines or other medicines to block the reaction or to treat inflammation.  Diet changes to remove foods that cause allergies.  Being exposed again and again to tiny amounts of allergens to help you build a defense against it (tolerance). This is called immunotherapy. Examples include: ? Allergy shot. You receive an injection that contains an allergen. ? Sublingual immunotherapy. You take a small dose of allergen under your tongue.  Emergency injection for anaphylaxis. You give yourself a shot using a syringe (auto-injector) that contains the amount of medicine you need. Your health care provider will teach you how to give yourself an injection.      Follow these instructions at home: Medicines  Take or apply over-the-counter and prescription medicines only as told by your health care provider.  Always carry your auto-injector pen if you are at risk of anaphylaxis. Give yourself an injection as told by your health care provider.   Eating and drinking  Follow instructions from your health care provider about eating or drinking restrictions.  Drink enough fluid to keep your urine pale yellow. General instructions  Wear a medical alert bracelet or necklace to let others know that you have had anaphylaxis before.  Avoid known allergens whenever possible.  Keep all follow-up visits as told by your health care provider. This is important. Contact a health care provider if:  Your symptoms do not get better with treatment. Get help right away if:  You have symptoms of anaphylaxis. These include: ? Swollen mouth, tongue, or throat. ? Pain or tightness in your chest. ? Trouble breathing or shortness of breath. ? Dizziness or  fainting. ? Severe abdominal pain, vomiting, or diarrhea. These symptoms may represent a serious problem that is an emergency. Do not wait to see if the symptoms will go away. Get medical help right away. Call your local emergency services (911 in the [place] Do not drive yourself to the hospital. Summary  Take or apply over-the-counter and prescription medicines only as told by your health care provider.  Avoid known allergens when possible.  Always carry your auto-injector pen if you are at risk of anaphylaxis. Give yourself an injection as told by your health care provider.  Wear a medical alert bracelet or necklace to let others know that you have had anaphylaxis before.  Anaphylaxis is a life-threatening emergency. Get help right away. This information is not intended to replace advice given to you by your health care provider. Make sure you discuss any questions you have with your health care provider. Document Revised: 10/11/2019 Document Reviewed: 12/23/2018 Elsevier Patient Education  2021 [employer] 0

## 2020-04-26 ENCOUNTER — Telehealth: Payer: Self-pay | Admitting: Pediatric Intensive Care

## 2020-04-26 ENCOUNTER — Encounter: Payer: Self-pay | Admitting: Pediatric Intensive Care

## 2020-04-26 DIAGNOSIS — J302 Other seasonal allergic rhinitis: Secondary | ICD-10-CM | POA: Insufficient documentation

## 2020-04-26 LAB — POC COVID19 BINAXNOW: SARS Coronavirus 2 Ag: NEGATIVE

## 2020-04-26 LAB — POCT INFLUENZA A/B
Influenza A, POC: NEGATIVE
Influenza B, POC: NEGATIVE

## 2020-04-26 NOTE — Telephone Encounter (Signed)
Spoke with Joy at [facility]. Citirizine, flonase and abilify are ready for pick up for client. Pharmacy last filled cymbalta, gabapentin and trazodone on 2/18. These were 30 day supplies. CN will pick up medication with referral form this afternoon.  Shann Medal RN BSN CNP [phone]

## 2020-04-27 NOTE — Congregational Nurse Program (Signed)
  Dept: [phone]   Congregational Nurse Program Note  Date of Encounter: 04/26/2020  Past Medical History: Past Medical History:  Diagnosis Date  . Anxiety   . Arthritis   . Cellulitis 09/2018  . Cellulitis and abscess of upper extremity 02/28/2018  . Cellulitis of hand, right 08/07/2019  . Cellulitis of leg, right   . Cellulitis of right hand 08/08/2019  . Chronic hepatitis C (HCC) 08/07/2019  . Depression   . Hepatitis    via pts mother  . 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 Nicotine dependence, cigarettes, uncomplicated 08/07/2019  . Right arm cellulitis 08/07/2019    Encounter Details: Visit to client to deliver medication. Client asks specifically about re-initiating Abilify. CN advised client that he may not need to take trazodone for sleep after taking the Abilify. Client will call CN regarding any increased drowsiness. Client has follow up at [facility] on 3/8 and will need transportation. Shann Medal RN BSN CNP [phone]

## 2020-04-29 ENCOUNTER — Emergency Department (HOSPITAL_COMMUNITY): Payer: Self-pay

## 2020-04-29 ENCOUNTER — Encounter (HOSPITAL_COMMUNITY): Payer: Self-pay

## 2020-04-29 ENCOUNTER — Other Ambulatory Visit: Payer: Self-pay

## 2020-04-29 ENCOUNTER — Emergency Department (HOSPITAL_COMMUNITY)
Admission: EM | Admit: 2020-04-29 | Discharge: 2020-04-30 | Disposition: A | Discharge status: Home / Self Care | Payer: Self-pay | Attending: Emergency Medicine | Admitting: Emergency Medicine

## 2020-04-29 DIAGNOSIS — F1420 Cocaine dependence, uncomplicated: Secondary | ICD-10-CM | POA: Diagnosis present

## 2020-04-29 DIAGNOSIS — F1594 Other stimulant use, unspecified with stimulant-induced mood disorder: Secondary | ICD-10-CM | POA: Diagnosis present

## 2020-04-29 DIAGNOSIS — F1190 Opioid use, unspecified, uncomplicated: Secondary | ICD-10-CM | POA: Diagnosis present

## 2020-04-29 DIAGNOSIS — Z20822 Contact with and (suspected) exposure to covid-19: Secondary | ICD-10-CM | POA: Insufficient documentation

## 2020-04-29 DIAGNOSIS — F17210 Nicotine dependence, cigarettes, uncomplicated: Secondary | ICD-10-CM | POA: Insufficient documentation

## 2020-04-29 DIAGNOSIS — L03114 Cellulitis of left upper limb: Secondary | ICD-10-CM | POA: Insufficient documentation

## 2020-04-29 DIAGNOSIS — R000 Tachycardia, unspecified: Secondary | ICD-10-CM | POA: Insufficient documentation

## 2020-04-29 LAB — COMPREHENSIVE METABOLIC PANEL
ALT: 348 U/L — ABNORMAL HIGH (ref 0–44)
AST: 209 U/L — ABNORMAL HIGH (ref 15–41)
Albumin: 4.8 g/dL (ref 3.5–5.0)
Alkaline Phosphatase: 97 U/L (ref 38–126)
Anion gap: 14 (ref 5–15)
BUN: 20 mg/dL (ref 6–20)
CO2: 23 mmol/L (ref 22–32)
Calcium: 10.1 mg/dL (ref 8.9–10.3)
Chloride: 99 mmol/L (ref 98–111)
Creatinine, Ser: 1.06 mg/dL (ref 0.61–1.24)
GFR, Estimated: >60 mL/min (ref >60)
Glucose, Bld: 114 mg/dL — ABNORMAL HIGH (ref 70–99)
Potassium: 3.7 mmol/L (ref 3.5–5.1)
Sodium: 136 mmol/L (ref 135–145)
Total Bilirubin: 1.3 mg/dL — ABNORMAL HIGH (ref 0.3–1.2)
Total Protein: 9.1 g/dL — ABNORMAL HIGH (ref 6.5–8.1)

## 2020-04-29 LAB — CBC WITH DIFFERENTIAL/PLATELET
Abs Immature Granulocytes: 0.03 10*3/uL (ref 0.00–0.07)
Basophils Absolute: 0.1 10*3/uL (ref 0.0–0.1)
Basophils Relative: 1 %
Eosinophils Absolute: 0 10*3/uL (ref 0.0–0.5)
Eosinophils Relative: 0 %
HCT: 48.8 % (ref 39.0–52.0)
Hemoglobin: 16.3 g/dL (ref 13.0–17.0)
Immature Granulocytes: 0 %
Lymphocytes Relative: 10 %
Lymphs Abs: 1.1 10*3/uL (ref 0.7–4.0)
MCH: 30.9 pg (ref 26.0–34.0)
MCHC: 33.4 g/dL (ref 30.0–36.0)
MCV: 92.6 fL (ref 80.0–100.0)
Monocytes Absolute: 1 10*3/uL (ref 0.1–1.0)
Monocytes Relative: 8 %
Neutro Abs: 9.6 10*3/uL — ABNORMAL HIGH (ref 1.7–7.7)
Neutrophils Relative %: 81 %
Platelets: 315 10*3/uL (ref 150–400)
RBC: 5.27 MIL/uL (ref 4.22–5.81)
RDW: 13.1 % (ref 11.5–15.5)
WBC: 11.8 10*3/uL — ABNORMAL HIGH (ref 4.0–10.5)
nRBC: 0 % (ref 0.0–0.2)

## 2020-04-29 LAB — RAPID URINE DRUG SCREEN, HOSP PERFORMED
Amphetamines: POSITIVE — AB
Barbiturates: NOT DETECTED
Benzodiazepines: NOT DETECTED
Cocaine: POSITIVE — AB
Opiates: NOT DETECTED
Tetrahydrocannabinol: POSITIVE — AB

## 2020-04-29 LAB — RESP PANEL BY RT-PCR (FLU A&B, COVID) ARPGX2
Influenza A by PCR: NEGATIVE
Influenza B by PCR: NEGATIVE
SARS Coronavirus 2 by RT PCR: NEGATIVE

## 2020-04-29 LAB — LACTIC ACID, PLASMA: Lactic Acid, Venous: 1.8 mmol/L (ref 0.5–1.9)

## 2020-04-29 LAB — BRAIN NATRIURETIC PEPTIDE: B Natriuretic Peptide: 24.6 pg/mL (ref 0.0–100.0)

## 2020-04-29 LAB — TROPONIN I (HIGH SENSITIVITY): Troponin I (High Sensitivity): 6 ng/L (ref <18)

## 2020-04-29 MED ORDER — IOHEXOL 350 MG/ML SOLN
100.0000 mL | Freq: Once | INTRAVENOUS | Status: AC | PRN
  Administered 2020-04-29: 100 mL via INTRAVENOUS

## 2020-04-29 MED ORDER — LORAZEPAM 1 MG PO TABS: 1.0000 mg | ORAL_TABLET | ORAL | Status: DC | PRN

## 2020-04-29 MED ORDER — VANCOMYCIN HCL 1750 MG/350ML IV SOLN
1750.0000 mg | Freq: Once | INTRAVENOUS | Status: AC
  Administered 2020-04-29: 1750 mg via INTRAVENOUS
  Filled 2020-04-29: qty 350

## 2020-04-29 MED ORDER — DOXYCYCLINE HYCLATE 100 MG PO TABS
100.0000 mg | ORAL_TABLET | Freq: Two times a day (BID) | ORAL | Status: DC
  Administered 2020-04-29 – 2020-04-30 (×3): 100 mg via ORAL
  Filled 2020-04-29 (×3): qty 1

## 2020-04-29 MED ORDER — DIPHENHYDRAMINE HCL 50 MG/ML IJ SOLN
25.0000 mg | Freq: Once | INTRAMUSCULAR | Status: AC
  Administered 2020-04-29: 25 mg via INTRAVENOUS
  Filled 2020-04-29: qty 1

## 2020-04-29 MED ORDER — LORAZEPAM 2 MG/ML IJ SOLN
2.0000 mg | Freq: Once | INTRAMUSCULAR | Status: AC
  Administered 2020-04-29: 2 mg via INTRAVENOUS
  Filled 2020-04-29: qty 1

## 2020-04-29 MED ORDER — SODIUM CHLORIDE 0.9 % IV SOLN: INTRAVENOUS | Status: DC

## 2020-04-29 MED ORDER — OLANZAPINE 5 MG PO TBDP
5.0000 mg | ORAL_TABLET | Freq: Three times a day (TID) | ORAL | Status: DC | PRN
Start: 2020-04-29 — End: 2020-04-30
  Administered 2020-04-30: 5 mg via ORAL
  Filled 2020-04-29: qty 1

## 2020-04-29 MED ORDER — STERILE WATER FOR INJECTION IJ SOLN
INTRAMUSCULAR | Status: AC
  Filled 2020-04-29: qty 10

## 2020-04-29 MED ORDER — SODIUM CHLORIDE 0.9 % IV BOLUS
1000.0000 mL | Freq: Once | INTRAVENOUS | Status: AC
  Administered 2020-04-29: 1000 mL via INTRAVENOUS

## 2020-04-29 MED ORDER — LORAZEPAM 2 MG/ML IJ SOLN: 1.0000 mg | Freq: Once | INTRAMUSCULAR | Status: DC

## 2020-04-29 MED ORDER — ZIPRASIDONE MESYLATE 20 MG IM SOLR
20.0000 mg | INTRAMUSCULAR | Status: AC | PRN
  Administered 2020-04-29: 20 mg via INTRAMUSCULAR
  Filled 2020-04-29: qty 20

## 2020-04-29 NOTE — ED Notes (Addendum)
Pt repeatedly trying to climb out of bed over bed rails and is still stating that he feels confused. Bed alarm and other safety measures still in place.

## 2020-04-29 NOTE — ED Provider Notes (Signed)
[facility]-EMERGENCY DEPT Provider Note   CSN: [id number] Arrival date & time: 04/29/20  0343     History Chief Complaint  Patient presents with  . Abscess    Robert [name] is a 40 y.o. male.  Patient presents to the emergency department for evaluation of redness and swelling of the left forearm and hand.  Patient reports infection at IV drug injection sites.  Patient has not had any fevers.  He does report that he has been noticing sensation of shortness of breath.  He is not experiencing any chest pain.        Past Medical History:  Diagnosis Date  . Anxiety   . Arthritis   . Cellulitis 09/2018  . Cellulitis and abscess of upper extremity 02/28/2018  . Cellulitis of hand, right 08/07/2019  . Cellulitis of leg, right   . Cellulitis of right hand 08/08/2019  . Chronic hepatitis C (HCC) 08/07/2019  . Depression   . Hepatitis    via pts mother  . 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 Nicotine dependence, cigarettes, uncomplicated 08/07/2019  . Right arm cellulitis 08/07/2019    Patient Active Problem List   Diagnosis Date Noted  . Seasonal allergies 04/26/2020  . Cellulitis 11/07/2019  . Neuropathy 10/12/2019  . Opioid use disorder 09/28/2019  . Chronic hepatitis C (HCC) 08/07/2019  . Nicotine dependence, cigarettes, uncomplicated 08/07/2019  . Cocaine use disorder, moderate, dependence (HCC) 07/07/2017  . 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 Substance induced mood disorder (HCC) 03/08/2017  . Alcoholism (HCC) 09/12/2014    Past Surgical History:  Procedure Laterality Date  . GALLBLADDER SURGERY         Family History  Problem Relation Age of Onset  . Diabetes Mother   . Diabetes Maternal Grandfather     Social History   Tobacco Use  . Smoking status: Current Every Day Smoker    Packs/day: 0.50    Types: Cigarettes, E-cigarettes  .  Smokeless tobacco: Never Used  . Tobacco comment: .5 PPD and vaping  Vaping Use  . Vaping Use: Some days  Substance Use Topics  . Alcohol use: Not Currently    Alcohol/week: 0.0 standard drinks  . Drug use: Not Currently    Types: Cocaine, Heroin, Methamphetamines    Comment: Sober x weeks     Home Medications Prior to Admission medications   Medication Sig Start Date End Date Taking? Authorizing Provider  ARIPiprazole (ABILIFY) 2 MG tablet Take 1 tablet (2 mg total) by mouth at bedtime. 04/25/20  Yes Mayers, Cari S, PA-C  Buprenorphine HCl-Naloxone HCl 8-2 MG FILM Place 1 Film under the tongue in the morning and at bedtime. 04/18/20  Yes Tyson Alias, MD  cetirizine (ZYRTEC) 10 MG tablet Take 1 tablet (10 mg total) by mouth daily. 04/25/20  Yes Mayers, Cari S, PA-C  DULoxetine (CYMBALTA) 60 MG capsule Take 1 capsule (60 mg total) by mouth daily. 04/25/20 05/25/20 Yes Mayers, Cari S, PA-C  fluticasone (FLONASE) 50 MCG/ACT nasal spray Place 2 sprays into both nostrils daily. 04/25/20  Yes Mayers, Cari S, PA-C  gabapentin (NEURONTIN) 300 MG capsule Take 1 capsule (300 mg total) by mouth 3 (three) times daily. 04/25/20 05/25/20 Yes Mayers, Cari S, PA-C  potassium chloride (KLOR-CON) 10 MEQ tablet Take 1 tablet (10 mEq total) by mouth daily. 03/07/20  Yes Mare Ferrari, PA-C  traZODone (DESYREL) 50 MG tablet Take 1 tablet (  50 mg total) by mouth at bedtime as needed for sleep. 04/25/20  Yes Mayers, Cari S, PA-C    Allergies    Hydrocodone and Codeine  Review of Systems   Review of Systems  Respiratory: Positive for shortness of breath.   Skin: Positive for color change and wound.  All other systems reviewed and are negative.   Physical Exam Updated Vital Signs BP (!) 162/110   Pulse (!) 108   Temp 99.1 F (37.3 C)   Resp 20   SpO2 99%   Physical Exam Vitals and nursing note reviewed.  Constitutional:      General: He is not in acute distress.    Appearance: Normal appearance.  He is well-developed and well-nourished.  HENT:     Head: Normocephalic and atraumatic.     Right Ear: Hearing normal.     Left Ear: Hearing normal.     Nose: Nose normal.     Mouth/Throat:     Mouth: Oropharynx is clear and moist and mucous membranes are normal.  Eyes:     Extraocular Movements: EOM normal.     Conjunctiva/sclera: Conjunctivae normal.     Pupils: Pupils are equal, round, and reactive to light.  Cardiovascular:     Rate and Rhythm: Regular rhythm. Tachycardia present.     Heart sounds: S1 normal and S2 normal. No murmur heard. No friction rub. No gallop.   Pulmonary:     Effort: Pulmonary effort is normal. No respiratory distress.     Breath sounds: Normal breath sounds.  Chest:     Chest wall: No tenderness.  Abdominal:     General: Bowel sounds are normal.     Palpations: Abdomen is soft. There is no hepatosplenomegaly.     Tenderness: There is no abdominal tenderness. There is no guarding or rebound. Negative signs include Murphy's sign and McBurney's sign.     Hernia: No hernia is present.  Musculoskeletal:        General: Normal range of motion.     Cervical back: Normal range of motion and neck supple.  Skin:    General: Skin is warm, dry and intact.     Findings: Erythema (Erythema and induration radial aspect of left forearm and dorsal aspect of hand) present. No rash.     Nails: There is no cyanosis.  Neurological:     Mental Status: He is alert and oriented to person, place, and time.     GCS: GCS eye subscore is 4. GCS verbal subscore is 5. GCS motor subscore is 6.     Cranial Nerves: No cranial nerve deficit.     Sensory: No sensory deficit.     Coordination: Coordination normal.     Deep Tendon Reflexes: Strength normal.  Psychiatric:        Mood and Affect: Mood and affect normal.        Speech: Speech normal.        Behavior: Behavior normal.        Thought Content: Thought content normal.     ED Results / Procedures / Treatments    Labs (all labs ordered are listed, but only abnormal results are displayed) Labs Reviewed  CBC WITH DIFFERENTIAL/PLATELET - Abnormal; Notable for the following components:      Result Value   WBC 11.8 (*)    Neutro Abs 9.6 (*)    All other components within normal limits  COMPREHENSIVE METABOLIC PANEL - Abnormal; Notable for the following components:  Glucose, Bld 114 (*)    Total Protein 9.1 (*)    AST 209 (*)    ALT 348 (*)    Total Bilirubin 1.3 (*)    All other components within normal limits  RESP PANEL BY RT-PCR (FLU A&B, COVID) ARPGX2  CULTURE, BLOOD (SINGLE)  LACTIC ACID, PLASMA  BRAIN NATRIURETIC PEPTIDE  RAPID URINE DRUG SCREEN, HOSP PERFORMED  TROPONIN I (HIGH SENSITIVITY)    EKG EKG Interpretation  Date/Time:  Saturday April 29 2020 05:03:08 EST Ventricular Rate:  108 PR Interval:    QRS Duration: 91 QT Interval:  328 QTC Calculation: 440 R Axis:   37 Text Interpretation: Sinus tachycardia Otherwise within normal limits Confirmed by Gilda Crease [phone]) on 04/29/2020 6:49:31 AM   Radiology No results found.  Procedures Procedures   Medications Ordered in ED Medications  iohexol (OMNIPAQUE) 350 MG/ML injection 100 mL (has no administration in time range)  LORazepam (ATIVAN) injection 2 mg (has no administration in time range)  sodium chloride 0.9 % bolus 1,000 mL (1,000 mLs Intravenous New Bag/Given 04/29/20 0453)    ED Course  I have reviewed the triage vital signs and the nursing notes.  Pertinent labs & imaging results that were available during my care of the patient were reviewed by me and considered in my medical decision making (see chart for details).    MDM Rules/Calculators/A&P                          Patient presents to the emergency department for evaluation of skin infection.  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  Patient has areas on his left forearm and hand consistent with soft tissue  infection.  No fluctuance to suggest drainable abscess.  Patient reports that he has been up for 2 days doing drugs.  He is tachycardic and anxious at arrival.  Unclear if this is secondary to the drug use or if there is an element of deeper infection.  Patient does report that he feels short of breath with the anxiousness.  He is certainly at risk for bacteremia, endocarditis, etc.  He does not have a significant leukocytosis and his lactic acid is normal, which is reassuring.  Patient given IV fluids and will be given Ativan for his anxiety.  Patient will have CT angiography of chest to evaluate for PE and septic emboli.  He is noted to have increased elevation of his transaminases as well.  He does have a stated history of hepatitis.  Will scan through the abdomen and pelvis as well to ensure there is no signs of liver abscess, etc.  Case will be signed out to oncoming ER physician to follow-up on imaging.  Final Clinical Impression(s) / ED Diagnoses Final diagnoses:  Cellulitis of left upper extremity    Rx / DC Orders ED Discharge Orders    None       Gilda Crease, MD 04/29/20 [phone]

## 2020-04-29 NOTE — ED Notes (Signed)
Patient asking for medicine to help calm down his anxiousness. Dr. Blinda Leatherwood notified. Awaiting orders.

## 2020-04-29 NOTE — ED Notes (Signed)
Patient is resting comfortably.  RR even/unlabored.  Side rails up, call bell in reach.

## 2020-04-29 NOTE — ED Notes (Signed)
Patient requesting medication again for calming him down. Dr. Blinda Leatherwood notified in person.

## 2020-04-29 NOTE — ED Notes (Addendum)
Pt found to be wandering around room. He had pulled IV out. Pt confused thinking he is [place] the "[place]". Pt is redirectable and complies but appears anxious. Dr. Blinda Leatherwood made aware of pt change in mental status and is at bedside

## 2020-04-29 NOTE — ED Triage Notes (Signed)
Multiple small abscesses on left hand/ wrist. Pt admits to injecting methamphetamines.

## 2020-04-29 NOTE — ED Notes (Addendum)
Pt assisted to restroom to provide urine specimen. Pt was able to ambulate to restroom, but was rambling and seemed confused. He was provided with a sample cup and verbalized understanding of giving specimen, however he walked around the restroom in circles and informed this tech that he did not have to use the restroom and he was confused about why he felt like he needed to go into the bathroom. Pt was escorted back to his room and assisted back into bed, with bed alarm placed due to confusion. This tech attempted to replace monitoring equipment in order to collect current vital signs, however pt continuously removed all equipment, will attempt at a later time.

## 2020-04-29 NOTE — Progress Notes (Signed)
A consult was received from an ED physician for Vancomycin per pharmacy dosing.  The patient's profile has been reviewed for ht/wt/allergies/indication/available labs.    A one time order has been placed for Vancomycin 1750mg  IV.  Further antibiotics/pharmacy consults should be ordered by admitting physician if indicated.                       Thank you, 04/29/2020  9:07 AM

## 2020-04-29 NOTE — ED Notes (Signed)
Pt observed to be cursing at staff. Security outside room

## 2020-04-29 NOTE — ED Notes (Signed)
Urinal at bedside. Asked patient to provide sample.

## 2020-04-29 NOTE — BH Assessment (Signed)
Comprehensive Clinical Assessment (CCA) Note  04/29/2020 [name] [id number]  Starkes NP recommended patient contnue to be monitored and observed due to AMS and bizarre behaviors. Patient will be seen by psychiatry in the a.m.    Patient is a 40 year old male presenting to [facility] voluntarily with AMS. Patient denies any S/I, H/I or AVH. Patient reports using multiple substances prior to arrival to include heroin and cocaine. Patient is vague in reference to time frames and amounts used. Patient is falling asleep during assessment and renders limited information. Patient was last seen on  03/10/20 at [facility] where he was being seen for cellulitis, patient was also seen by social work there and was provided with resources for shelters due to patient being homeless. Patient also presented to [facility] on 03/07/20 for Cellulitis and [facility] on 02/22/20 for Malingering. Patient per chart review was seen on 04/25/20 by [facility] PA who assists with medication management for depression. Patient reports current compliance this date with medications for symptom management. Patient has also been seen multiple times for substance induced mood disorder. Due to patient being drowsy and impaired this date patient renders limited history in reference to current SA use. Patient does report that he used heroin and cocaine yesterday and believes it to have been altered. Patient per notes has been exhibiting bizarre behaviors since he arrived. Patient is contracting for safety and is "just wanting to sleep."    Patient is not oriented and states it is October 1997. Patient's memory is recent impaired and thoughts disorganized. It is unclear if patient is responding to internal stimuli. Starkes NP recommended patient contnue to be monitored and observed due to AMS and bizarre behaviors. Patient will be seen by psychiatry in the a.m.    Chief Complaint:  Chief Complaint  Patient presents with  . Abscess    Visit Diagnosis: Substance induced psychosis     CCA Screening, Triage and Referral (STR)  Patient Reported Information How did you hear about [place]? Self  Referral name: No data recorded Referral phone number: No data recorded  Whom do you see for routine medical problems? I don't have a doctor  Practice/Facility Name: No data recorded Practice/Facility Phone Number: No data recorded Name of Contact: No data recorded Contact Number: No data recorded Contact Fax Number: No data recorded Prescriber Name: No data recorded Prescriber Address (if known): No data recorded  What Is the Reason for Your Visit/Call Today? Substance induced mood disorder  How Long Has This Been Causing You Problems? 1-6 months  What Do You Feel Would Help You the Most Today? Assessment Only; Therapy   Have You Recently Been in Any Inpatient Treatment (Hospital/Detox/Crisis Center/28-Day Program)? No  Name/Location of Program/Hospital:No data recorded How Long Were You There? No data recorded When Were You Discharged? No data recorded  Have You Ever Received Services From [facility] Before? No  Who Do You See at [facility]? No data recorded  Have You Recently Had Any Thoughts About Hurting Yourself? No  Are You Planning to Commit Suicide/Harm Yourself At This time? No (Unknown)   Have you Recently Had Thoughts About Hurting Someone Karolee Ohs? No  Explanation: No data recorded  Have You Used Any Alcohol or Drugs in the Past 24 Hours? Yes  How Long Ago Did You Use Drugs or Alcohol? 0508  What Did You Use and How Much? Heroin, Fentanyl, Methamphetamine   Do You Currently Have a Therapist/Psychiatrist? No  Name of Therapist/Psychiatrist:  No data recorded  Have You Been Recently Discharged From Any Office Practice or Programs? No  Explanation of Discharge From Practice/Program: No data recorded    CCA Screening Triage Referral Assessment Type of Contact: Face-to-Face  Is this Initial  or Reassessment? No data recorded Date Telepsych consult ordered in [facility]:   (09/17/19)  Time Telepsych consult ordered in [facility]:  No data recorded  Patient Reported Information Reviewed? Yes  Patient Left Without Being Seen? No data recorded Reason for Not Completing Assessment: No data recorded  Collateral Involvement: No data recorded  Does Patient Have a Court Appointed Legal Guardian? No data recorded Name and Contact of Legal Guardian: -- (n/a)  If Minor and Not Living with Parent(s), Who has Custody? -- (n/a)  Is CPS involved or ever been involved? Never  Is APS involved or ever been involved? Never   Patient Determined To Be At Risk for Harm To Self or Others Based on Review of Patient Reported Information or Presenting Complaint? No  Method: No data recorded Availability of Means: No data recorded Intent: No data recorded Notification Required: No data recorded Additional Information for Danger to Others Potential: No data recorded Additional Comments for Danger to Others Potential: No data recorded Are There Guns or Other Weapons in Your Home? No data recorded Types of Guns/Weapons: No data recorded Are These Weapons Safely Secured?                            No data recorded Who Could Verify You Are Able To Have These Secured: No data recorded Do You Have any Outstanding Charges, Pending Court Dates, Parole/Probation? No data recorded Contacted To Inform of Risk of Harm To Self or Others: No data recorded  Location of Assessment: [facility] ED   Does Patient Present under Involuntary Commitment? No  IVC Papers Initial File Date: No data recorded  [place] of Residence: [place]   Patient Currently Receiving the Following Services: Not Receiving Services   Determination of Need: Emergent (2 hours)   Options For Referral: Outpatient Therapy     CCA Biopsychosocial Intake/Chief Complaint:  Suicidal thoughts  Current Symptoms/Problems: Suicidal thoughts, patietn  is also under the influence of substances   Patient Reported Schizophrenia/Schizoaffective Diagnosis in Past: No   Strengths: Unknown  Preferences: Unknown  Abilities: Unknown   Type of Services Patient Feels are Needed: Unknown   Initial Clinical Notes/Concerns: None   Mental Health Symptoms Depression:  Irritability   Duration of Depressive symptoms: Greater than two weeks   Mania:  None   Anxiety:   None   Psychosis:  None   Duration of Psychotic symptoms: No data recorded  Trauma:  None   Obsessions:  None   Compulsions:  None   Inattention:  None   Hyperactivity/Impulsivity:  N/A   Oppositional/Defiant Behaviors:  None   Emotional Irregularity:  None   Other Mood/Personality Symptoms:  No data recorded   Mental Status Exam Appearance and self-care  Stature:  Average   Weight:  Average weight   Clothing:  Disheveled   Grooming:  Neglected   Cosmetic use:  None   Posture/gait:  Normal   Motor activity:  Slowed   Sensorium  Attention:  Distractible   Concentration:  Scattered   Orientation:  X5   Recall/memory:  Normal   Affect and Mood  Affect:  Appropriate   Mood:  Angry; Irritable   Relating  Eye contact:  Avoided  Facial expression:  Tense   Attitude toward examiner:  Argumentative; Irritable   Thought and Language  Speech flow: Slurred   Thought content:  Appropriate to Mood and Circumstances   Preoccupation:  None   Hallucinations:  None   Organization:  No data recorded  [employer] of Knowledge:  Average   Intelligence:  Average   Abstraction:  Normal   Judgement:  Fair   Dance movement psychotherapist:  Adequate   Insight:  Poor   Decision Making:  Normal   Social Functioning  Social Maturity:  Impulsive   Social Judgement:  "Chief of Staff"   Stress  Stressors:  Surveyor, quantity; Housing   Coping Ability:  Contractor Deficits:  None   Supports:  Support needed      Religion: Religion/Spirituality Are You A Religious Person?: No  Leisure/Recreation: Leisure / Recreation Do You Have Hobbies?: No  Exercise/Diet: Exercise/Diet Do You Exercise?: No Have You Gained or Lost A Significant Amount of Weight in the Past Six Months?: No Do You Follow a Special Diet?: No Do You Have Any Trouble Sleeping?: No   CCA Employment/Education Employment/Work Situation: Employment / Work Psychologist, occupational Employment situation: Unemployed Patient's job has been impacted by current illness: Yes Has patient ever been in the [employer]?: No  Education: Education Last Grade Completed: 9 Did Garment/textile technologist From [employer]?: No Did Theme park manager?: No Did Designer, television/film set?: No   CCA Family/Childhood History Family and Relationship History: Family history Marital status: Single Are you sexually active?: No What is your sexual orientation?: Unknown Has your sexual activity been affected by drugs, alcohol, medication, or emotional stress?: Unknown Does patient have children?:  (Unknown)  Childhood History:  Childhood History By whom was/is the patient raised?: Mother/father and step-parent,Grandparents Additional childhood history information: Unknown Description of patient's relationship with caregiver when they were a child: Unknown Patient's description of current relationship with people who raised him/her: Unknown How were you disciplined when you got in trouble as a child/adolescent?: Unknown Does patient have siblings?:  (UTA) Did patient suffer any verbal/emotional/physical/sexual abuse as a child?: No (UTA) Did patient suffer from severe childhood neglect?: No (UTA) Has patient ever been sexually abused/assaulted/raped as an adolescent or adult?: No (UTA) Was the patient ever a victim of a crime or a disaster?: No (UTA) Witnessed domestic violence?: No (UTA) Has patient been affected by domestic violence as an adult?: No  (UTA)  Child/Adolescent Assessment:     CCA Substance Use Alcohol/Drug Use: Alcohol / Drug Use Pain Medications: See MAR Prescriptions: See MAR Over the Counter: See MAR History of alcohol / drug use?: Yes Longest period of sobriety (when/how long): 18 mos (began Oct 2015) most recent peroid of sobriety was 15 mos Negative Consequences of Use: Financial,Legal,Personal relationships,Work / School Withdrawal Symptoms: Agitation,Tingling Substance #1 Name of Substance 1: Heroin Substance #2 Name of Substance 2: Fetanyl 2 - Age of First Use: 16 2 - Amount (size/oz): varies 2 - Frequency: "I only drink when I am high....it's not often" 2 - Duration: "Since I was 41 or 40 yrs old old" 2 - Last Use / Amount: 09/16/2019; "Couple of 22 ounce beers" Substance #3 Name of Substance 3: Methamphetamine 3 - Age of First Use: 40 yrs old 3 - Amount (size/oz): "a gram" 3 - Frequency: 1x every 6 months 3 - Last Use / Amount: 09/16/19                   ASAM's:  Six Dimensions of Multidimensional Assessment  Dimension 1:  Acute Intoxication and/or Withdrawal Potential:      Dimension 2:  Biomedical Conditions and Complications:      Dimension 3:  Emotional, Behavioral, or Cognitive Conditions and Complications:     Dimension 4:  Readiness to Change:     Dimension 5:  Relapse, Continued use, or Continued Problem Potential:     Dimension 6:  Recovery/Living Environment:     ASAM Severity Score:    ASAM Recommended Level of Treatment:     Substance use Disorder (SUD)    Recommendations for Services/Supports/Treatments:    DSM5 Diagnoses: Patient Active Problem List   Diagnosis Date Noted  . Seasonal allergies 04/26/2020  . Cellulitis 11/07/2019  . Neuropathy 10/12/2019  . Opioid use disorder 09/28/2019  . Chronic hepatitis C (HCC) 08/07/2019  . Nicotine dependence, cigarettes, uncomplicated 08/07/2019  . Cocaine use disorder, moderate, dependence (HCC) 07/07/2017  . MDD  (major depressive disorder), recurrent severe, without psychosis (HCC) 03/09/2017  . Substance induced mood disorder (HCC) 03/08/2017  . Alcoholism (HCC) 09/12/2014    Patient Centered Plan: Patient is on the following Treatment Plan(s):    Referrals to Alternative Service(s): Referred to Alternative Service(s):   Place:   Date:   Time:    Referred to Alternative Service(s):   Place:   Date:   Time:    Referred to Alternative Service(s):   Place:   Date:   Time:    Referred to Alternative Service(s):   Place:   Date:   Time:     Alfredia FergusonDavid L Caswell Alvillar, LCAS

## 2020-04-29 NOTE — ED Provider Notes (Addendum)
Patient signed to me by Dr. Blinda Leatherwood.  Patient has been seen and treated for an infection to his left upper extremity due to IV drug use.  Patient had some bizarre behavior here.  He was medicated vancomycin for his infection.  Patient started on doxycycline.  Was given Ativan as well as Benadryl.  After several hours patient appears to have some drug-induced psychosis.  Patient did not seem to respond well to Ativan.  This seemed to make him more agitated.  His drug screen was positive for cocaine, amphetamines.  Plan is to consult behavioral health for placement   Lorre Nick, MD 04/29/20 1301    Lorre Nick, MD 04/29/20 1302

## 2020-04-29 NOTE — ED Notes (Signed)
Patient denies pain and is resting comfortably.  Vitals stable, pt a&ox4, soda and snack given to pt per request.  Side rails up x1, call bell within reach.

## 2020-04-29 NOTE — ED Notes (Signed)
Pt resting comfortably, NAD noted, rr e/u.  Side rails up x1, call bell in reach.

## 2020-04-29 NOTE — ED Notes (Signed)
Patient attempted to provide urine sample. Unsuccessful at this time. Patient will try again and use call bell when he is able to.

## 2020-04-30 DIAGNOSIS — F1594 Other stimulant use, unspecified with stimulant-induced mood disorder: Secondary | ICD-10-CM

## 2020-04-30 MED ORDER — DOXYCYCLINE HYCLATE 100 MG PO CAPS: 100.0000 mg | ORAL_CAPSULE | Freq: Two times a day (BID) | ORAL | 0 refills | Status: AC

## 2020-04-30 MED ORDER — DOXYCYCLINE HYCLATE 100 MG PO CAPS: 100.0000 mg | ORAL_CAPSULE | Freq: Two times a day (BID) | ORAL | 0 refills | Status: DC

## 2020-04-30 NOTE — ED Notes (Signed)
Patient denies pain and is resting comfortably.  

## 2020-04-30 NOTE — ED Provider Notes (Addendum)
Cleared by psychiatry. Discharge. We will continue doxycycline.   Virgina Norfolk, DO 04/30/20 1246    Virgina Norfolk, DO 04/30/20 1247

## 2020-04-30 NOTE — ED Notes (Signed)
Pt a&ox4, resting in bed, nad noted, denies pain, no needs at this time.

## 2020-04-30 NOTE — ED Notes (Signed)
Patient is resting comfortably. 

## 2020-04-30 NOTE — ED Notes (Signed)
Pt resting comfortably, NAD noted, rr e/u.  Side rails up x1, call bell in reach.  

## 2020-04-30 NOTE — ED Notes (Addendum)
Water,soda, and chap stick provided to pt per request. Pt a&o, rr e/u, call light in reach, side rail up x1, bedside table within reach.

## 2020-04-30 NOTE — ED Notes (Signed)
Patient is resting comfortably.  Call light in reach, side rails up x1.  RR e/u, NAD noted.

## 2020-04-30 NOTE — Consult Note (Signed)
[facility] Psych ED Discharge  04/30/2020 12:23 PM Robert [name]  MRN:  [id number] Principal Problem: Stimulant-induced mood disorder [facility]) Discharge Diagnoses: Principal Problem:   Stimulant-induced mood disorder (HCC) Active Problems:   Cocaine use disorder, moderate, dependence (HCC)   Opioid use disorder   Subjective: Patient presents with voluntarily with AMS. On exam today patient is alert and orient in all spheres. He reports desire to detox from methamphetamines. He reports wanted to connect with rehabilitation services if he is able to get his suboxone at the rehab location. He plan is to d/c to shelter.  He denies experiencing w/d symptoms at this time. He denies suicidal and homicidal ideation, auditory and visual hallucinations. Thought process absent content suggestive of delusions or paranoia. Clear and coherent, psychiatrically stable for discharge  Total Time spent with patient: 45 minutes  Past Psychiatric History: anxiety, depression, substance use d/o  Past Medical History:  Past Medical History:  Diagnosis Date  . Anxiety   . Arthritis   . Cellulitis 09/2018  . Cellulitis and abscess of upper extremity 02/28/2018  . Cellulitis of hand, right 08/07/2019  . Cellulitis of leg, right   . Cellulitis of right hand 08/08/2019  . Chronic hepatitis C (HCC) 08/07/2019  . Depression   . Hepatitis    via pts mother  . 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 Nicotine dependence, cigarettes, uncomplicated 08/07/2019  . Right arm cellulitis 08/07/2019    Past Surgical History:  Procedure Laterality Date  . GALLBLADDER SURGERY     Family History:  Family History  Problem Relation Age of Onset  . Diabetes Mother   . Diabetes Maternal Grandfather    Family Psychiatric  History: none Social History:  Social History   Substance and Sexual Activity  Alcohol Use Not Currently  . Alcohol/week: 0.0 standard drinks     Social History    Substance and Sexual Activity  Drug Use Not Currently  . Types: Cocaine, Heroin, Methamphetamines   Comment: Sober x weeks     Social History   Socioeconomic History  . Marital status: Single    Spouse name: Not on file  . Number of children: Not on file  . Years of education: Not on file  . Highest education level: Not on file  Occupational History  . Not on file  Tobacco Use  . Smoking status: Current Every Day Smoker    Packs/day: 0.50    Types: Cigarettes, E-cigarettes  . Smokeless tobacco: Never Used  . Tobacco comment: .5 PPD and vaping  Vaping Use  . Vaping Use: Some days  Substance and Sexual Activity  . Alcohol use: Not Currently    Alcohol/week: 0.0 standard drinks  . Drug use: Not Currently    Types: Cocaine, Heroin, Methamphetamines    Comment: Sober x weeks   . Sexual activity: Not Currently  Other Topics Concern  . Not on file  Social History Narrative  . Not on file   Social Determinants of Health   Financial Resource Strain: Not on file  Food Insecurity: Not on file  Transportation Needs: Not on file  Physical Activity: Not on file  Stress: Not on file  Social Connections: Not on file    Has this patient used any form of tobacco in the last 30 days? (Cigarettes, Smokeless Tobacco, Cigars, and/or Pipes) A prescription for an FDA-approved tobacco cessation medication was offered at discharge and the patient refused  Current Medications: Current Facility-Administered Medications  Medication  Dose Route Frequency Provider Last Rate Last Admin  . 0.9 %  sodium chloride infusion   Intravenous Continuous Lorre Nick, MD   Stopped at 04/30/20 [phone]  . doxycycline (VIBRA-TABS) tablet 100 mg  100 mg Oral Q12H Lorre Nick, MD   100 mg at 04/30/20 1045  . LORazepam (ATIVAN) injection 1 mg  1 mg Intravenous Once Lorre Nick, MD      . OLANZapine zydis [facility]) disintegrating tablet 5 mg  5 mg Oral Q8H PRN Lorre Nick, MD   5 mg at 04/30/20 1045    Current Outpatient Medications  Medication Sig Dispense Refill  . ARIPiprazole (ABILIFY) 2 MG tablet Take 1 tablet (2 mg total) by mouth at bedtime. 30 tablet 0  . Buprenorphine HCl-Naloxone HCl 8-2 MG FILM Place 1 Film under the tongue in the morning and at bedtime. 28 each 0  . cetirizine (ZYRTEC) 10 MG tablet Take 1 tablet (10 mg total) by mouth daily. 30 tablet 11  . DULoxetine (CYMBALTA) 60 MG capsule Take 1 capsule (60 mg total) by mouth daily. 30 capsule 0  . fluticasone (FLONASE) 50 MCG/ACT nasal spray Place 2 sprays into both nostrils daily. 16 g 6  . gabapentin (NEURONTIN) 300 MG capsule Take 1 capsule (300 mg total) by mouth 3 (three) times daily. 90 capsule 0  . potassium chloride (KLOR-CON) 10 MEQ tablet Take 1 tablet (10 mEq total) by mouth daily. 30 tablet 1  . traZODone (DESYREL) 50 MG tablet Take 1 tablet (50 mg total) by mouth at bedtime as needed for sleep. 30 tablet 0   PTA Medications: (Not in a hospital admission)   Musculoskeletal: Strength & Muscle Tone: within normal limits Gait & Station: normal Patient leans: N/A  Psychiatric Specialty Exam: Physical Exam Vitals and nursing note reviewed.  Constitutional:      Appearance: Normal appearance.  HENT:     Head: Normocephalic.     Nose: Nose normal.  Pulmonary:     Effort: Pulmonary effort is normal.  Musculoskeletal:        General: Normal range of motion.     Cervical back: Normal range of motion.  Neurological:     General: No focal deficit present.     Mental Status: He is alert and oriented to person, place, and time.  Psychiatric:        Attention and Perception: Attention and perception normal.        Mood and Affect: Mood is anxious.        Speech: Speech normal.        Behavior: Behavior normal. Behavior is cooperative.        Thought Content: Thought content normal.        Cognition and Memory: Cognition and memory normal.        Judgment: Judgment normal.     Review of Systems   Psychiatric/Behavioral: The patient is nervous/anxious.   All other systems reviewed and are negative.   Blood pressure 108/62, pulse 73, temperature 98 F (36.7 C), temperature source Oral, resp. rate 17, height 5\' 5"  (1.651 m), weight 88.9 kg, SpO2 100 %.Body mass index is 32.61 kg/m.  General Appearance: Disheveled  Eye Contact:  Good  Speech:  Clear and Coherent and Normal Rate  Volume:  Normal  Mood:  Euthymic  Affect:  Congruent  Thought Process:  Coherent and Goal Directed  Orientation:  Full (Time, Place, and Person)  Thought Content:  WDL and Logical  Suicidal Thoughts:  No  Homicidal  Thoughts:  No  Memory:  Immediate;   Good Recent;   Good Remote;   Good  Judgement:  Fair  Insight:  Good  Psychomotor Activity:  Normal  Concentration:  Concentration: Good and Attention Span: Good  Recall:  Good  Fund of Knowledge:  Good  Language:  Good  Akathisia:  Negative  Handed:  Right  AIMS (if indicated):     Assets:  Architect Housing Social Support  ADL's:  Intact  Cognition:  WNL  Sleep:        Demographic Factors:  Male, Caucasian and Unemployed  Loss Factors: NA  Historical Factors: NA  Risk Reduction Factors:   Positive coping skills or problem solving skills  Continued Clinical Symptoms:  Alcohol/Substance Abuse/Dependencies  Cognitive Features That Contribute To Risk:  None    Suicide Risk:  Minimal: No identifiable suicidal ideation.  Patients presenting with no risk factors but with morbid ruminations; may be classified as minimal risk based on the severity of the depressive symptoms    Plan Of Care/Follow-up recommendations:  Stimulant induced mood disorder: -Refrain from alcohol and drug use -Attend 12-step program -Rehab -IOP at [facility] information provided Activity:  As tolerated Diet:  Heart healthy  Disposition: Discharge with resources for rehabilitation. Discussed crisis plan, support from  social network, calling 911, coming to the Emergency Department, and calling the Suicide Hotline.   Nanine Means, NP 04/30/2020, 12:23 PM

## 2020-04-30 NOTE — ED Notes (Signed)
Pt a&ox4, ambulating in [place] from restroom back to ED room 15.

## 2020-04-30 NOTE — Discharge Instructions (Signed)
[facility] Mental health clinic in [place], [place] Address: [address], [place], [place] [address] Hours:  Open 24 hours Phone: [phone] 

## 2020-04-30 NOTE — ED Notes (Signed)
Patient is resting comfortably.  NAD noted, vss, pt a&ox4.  Call light in reach.

## 2020-05-02 ENCOUNTER — Telehealth: Payer: Self-pay | Admitting: Pediatric Intensive Care

## 2020-05-02 ENCOUNTER — Telehealth: Payer: Self-pay | Admitting: *Deleted

## 2020-05-02 DIAGNOSIS — F1190 Opioid use, unspecified, uncomplicated: Secondary | ICD-10-CM

## 2020-05-02 NOTE — Telephone Encounter (Signed)
Call placed to patient for today's missed appt. No answer and no VMB set up. Kinnie Feil, BSN, RN-BC

## 2020-05-02 NOTE — Telephone Encounter (Signed)
Call to client to remind him of [facility]oad pickup time. Left VM. Shann Medal RN BSN CNP [phone]

## 2020-05-04 LAB — CULTURE, BLOOD (SINGLE)
Culture: NO GROWTH
Culture: NO GROWTH
Special Requests: ADEQUATE

## 2020-05-28 ENCOUNTER — Emergency Department (HOSPITAL_COMMUNITY)
Admission: EM | Admit: 2020-05-28 | Discharge: 2020-05-28 | Discharge status: Against Medical Advice | Payer: Self-pay | Attending: Emergency Medicine | Admitting: Emergency Medicine

## 2020-05-28 ENCOUNTER — Encounter (HOSPITAL_COMMUNITY): Payer: Self-pay

## 2020-05-28 ENCOUNTER — Emergency Department (HOSPITAL_COMMUNITY): Payer: Self-pay

## 2020-05-28 ENCOUNTER — Other Ambulatory Visit: Payer: Self-pay

## 2020-05-28 DIAGNOSIS — F17210 Nicotine dependence, cigarettes, uncomplicated: Secondary | ICD-10-CM | POA: Insufficient documentation

## 2020-05-28 DIAGNOSIS — F419 Anxiety disorder, unspecified: Secondary | ICD-10-CM | POA: Insufficient documentation

## 2020-05-28 DIAGNOSIS — R0789 Other chest pain: Secondary | ICD-10-CM | POA: Insufficient documentation

## 2020-05-28 LAB — BASIC METABOLIC PANEL
Anion gap: 9 (ref 5–15)
BUN: 8 mg/dL (ref 6–20)
CO2: 28 mmol/L (ref 22–32)
Calcium: 9.2 mg/dL (ref 8.9–10.3)
Chloride: 102 mmol/L (ref 98–111)
Creatinine, Ser: 0.77 mg/dL (ref 0.61–1.24)
GFR, Estimated: >60 mL/min (ref >60)
Glucose, Bld: 124 mg/dL — ABNORMAL HIGH (ref 70–99)
Potassium: 3.5 mmol/L (ref 3.5–5.1)
Sodium: 139 mmol/L (ref 135–145)

## 2020-05-28 LAB — CBC
HCT: 45.8 % (ref 39.0–52.0)
Hemoglobin: 15 g/dL (ref 13.0–17.0)
MCH: 30.7 pg (ref 26.0–34.0)
MCHC: 32.8 g/dL (ref 30.0–36.0)
MCV: 93.7 fL (ref 80.0–100.0)
Platelets: 249 10*3/uL (ref 150–400)
RBC: 4.89 MIL/uL (ref 4.22–5.81)
RDW: 12.6 % (ref 11.5–15.5)
WBC: 8.6 10*3/uL (ref 4.0–10.5)
nRBC: 0 % (ref 0.0–0.2)

## 2020-05-28 LAB — TROPONIN I (HIGH SENSITIVITY): Troponin I (High Sensitivity): 3 ng/L (ref <18)

## 2020-05-28 NOTE — ED Triage Notes (Signed)
Pt reports using cocaine over 24 hours ago. Pt states that he has been unable to sleep and has racing thoughts. Pt also endorses severe chest pain earlier today. Pt denies SI/HI.

## 2020-05-28 NOTE — ED Notes (Signed)
Patient not visualized in the room, gown and leads on the bed, patient appears to have left the department. Not in bathroom or in the lobby.

## 2020-05-28 NOTE — ED Notes (Signed)
Blue,Red, Gold, Dark green top tube sent to lab.

## 2020-05-28 NOTE — ED Provider Notes (Signed)
[facility]-EMERGENCY DEPT Provider Note   CSN: [id number] Arrival date & time: 05/28/20  1636     History Chief Complaint  Patient presents with  . Chest Pain  . Anxiety    Raynald Rouillard is a 40 y.o. male.  The history is provided by the patient and medical records. No language interpreter was used.  Chest Pain Associated symptoms: anxiety   Anxiety Associated symptoms include chest pain.     40 year old male significant history of polysubstance use including cocaine use presenting for evaluation of chest pain.  Patient report he developed pain in his chest while riding on the bus approximately 2 hours ago.  Described pain as a sharp spasm pressure sensation in his left chest radiates to his left arm.  He felt a bit sweaty.  Pain has since subsided bit but not resolved.  No associated fever chills lightheadedness nausea or shortness of breath.  No specific treatment tried.  States that he has had anxiety and having chest pain anxiety related for the past several weeks as well.  Admits to recent cocaine use in the past 24 hours.  Also report meth use a week ago.  Denies alcohol use.  Denies SI HI.  Denies any strong family history of premature cardiac death.  No recent injury.  Currently rates his pain is 3 out of 10  Past Medical History:  Diagnosis Date  . Anxiety   . Arthritis   . Cellulitis 09/2018  . Cellulitis and abscess of upper extremity 02/28/2018  . Cellulitis of hand, right 08/07/2019  . Cellulitis of leg, right   . Cellulitis of right hand 08/08/2019  . Chronic hepatitis C (HCC) 08/07/2019  . Depression   . Hepatitis    via pts mother  . 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 Nicotine dependence, cigarettes, uncomplicated 08/07/2019  . Right arm cellulitis 08/07/2019    Patient Active Problem List   Diagnosis Date Noted  . Seasonal allergies 04/26/2020  . Cellulitis 11/07/2019  . Neuropathy  10/12/2019  . Opioid use disorder 09/28/2019  . Chronic hepatitis C (HCC) 08/07/2019  . Nicotine dependence, cigarettes, uncomplicated 08/07/2019  . Cocaine use disorder, moderate, dependence (HCC) 07/07/2017  . 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 Alcoholism (HCC) 09/12/2014    Past Surgical History:  Procedure Laterality Date  . GALLBLADDER SURGERY         Family History  Problem Relation Age of Onset  . Diabetes Mother   . Diabetes Maternal Grandfather     Social History   Tobacco Use  . Smoking status: Current Every Day Smoker    Packs/day: 0.50    Types: Cigarettes, E-cigarettes  . Smokeless tobacco: Never Used  . Tobacco comment: .5 PPD and vaping  Vaping Use  . Vaping Use: Some days  Substance Use Topics  . Alcohol use: Not Currently    Alcohol/week: 0.0 standard drinks  . Drug use: Not Currently    Types: Cocaine, Heroin, Methamphetamines    Comment: Sober x weeks     Home Medications Prior to Admission medications   Medication Sig Start Date End Date Taking? Authorizing Provider  ARIPiprazole (ABILIFY) 2 MG tablet Take 1 tablet (2 mg total) by mouth at bedtime. 04/25/20   Mayers, Cari S, PA-C  Buprenorphine HCl-Naloxone HCl 8-2 MG FILM Place 1 Film under the tongue in the morning and at bedtime. 04/18/20   Tyson Alias, MD  cetirizine (ZYRTEC) 10 MG tablet  Take 1 tablet (10 mg total) by mouth daily. 04/25/20   Mayers, Cari S, PA-C  DULoxetine (CYMBALTA) 60 MG capsule Take 1 capsule (60 mg total) by mouth daily. 04/25/20 05/25/20  Mayers, Cari S, PA-C  fluticasone (FLONASE) 50 MCG/ACT nasal spray Place 2 sprays into both nostrils daily. 04/25/20   Mayers, Cari S, PA-C  gabapentin (NEURONTIN) 300 MG capsule Take 1 capsule (300 mg total) by mouth 3 (three) times daily. 04/25/20 05/25/20  Mayers, Cari S, PA-C  potassium chloride (KLOR-CON) 10 MEQ tablet Take 1 tablet (10 mEq total) by mouth daily. 03/07/20   Mare Ferrari, PA-C  traZODone (DESYREL)  50 MG tablet Take 1 tablet (50 mg total) by mouth at bedtime as needed for sleep. 04/25/20   Mayers, Cari S, PA-C    Allergies    Hydrocodone and Codeine  Review of Systems   Review of Systems  Cardiovascular: Positive for chest pain.  All other systems reviewed and are negative.   Physical Exam Updated Vital Signs BP 122/79 (BP Location: Left Arm)   Pulse 80   Temp 98 F (36.7 C) (Oral)   Resp 18   SpO2 100%   Physical Exam Vitals and nursing note reviewed.  Constitutional:      General: He is not in acute distress.    Appearance: He is well-developed.  HENT:     Head: Atraumatic.  Eyes:     Conjunctiva/sclera: Conjunctivae normal.  Cardiovascular:     Rate and Rhythm: Normal rate and regular rhythm.     Pulses: Normal pulses.     Heart sounds: Normal heart sounds. No murmur heard. No friction rub. No gallop.   Pulmonary:     Effort: Pulmonary effort is normal.     Breath sounds: Normal breath sounds. No wheezing, rhonchi or rales.  Chest:     Chest wall: No tenderness.  Abdominal:     Palpations: Abdomen is soft.     Tenderness: There is no abdominal tenderness.  Musculoskeletal:     Cervical back: Neck supple.  Skin:    Findings: No rash.  Neurological:     Mental Status: He is alert and oriented to person, place, and time.  Psychiatric:        Mood and Affect: Mood normal.     ED Results / Procedures / Treatments   Labs (all labs ordered are listed, but only abnormal results are displayed) Labs Reviewed  BASIC METABOLIC PANEL - Abnormal; Notable for the following components:      Result Value   Glucose, Bld 124 (*)    All other components within normal limits  CBC  TROPONIN I (HIGH SENSITIVITY)  TROPONIN I (HIGH SENSITIVITY)    EKG EKG Interpretation  Date/Time:  Sunday May 28 2020 16:45:21 EDT Ventricular Rate:  74 PR Interval:  140 QRS Duration: 94 QT Interval:  377 QTC Calculation: 419 R Axis:   11 Text Interpretation: Sinus rhythm  12 Lead; Mason-Likar Confirmed by Rolan Bucco [phone]) on 05/28/2020 5:34:35 PM   Radiology DG Chest 2 View  Result Date: 05/28/2020 CLINICAL DATA:  Pt reports using cocaine over 24 hours ago. Pt states that he has been unable to sleep and has racing thoughts. Pt also endorses severe chest pain earlier today.chest pain EXAM: CHEST - 2 VIEW COMPARISON:  None. FINDINGS: Normal mediastinum and cardiac silhouette. Normal pulmonary vasculature. No evidence of effusion, infiltrate, or pneumothorax. No acute bony abnormality. IMPRESSION: No acute cardiopulmonary process. Electronically Signed   By:  Genevive Bi M.D.   On: 05/28/2020 17:40    Procedures Procedures   Medications Ordered in ED Medications - No data to display  ED Course  I have reviewed the triage vital signs and the nursing notes.  Pertinent labs & imaging results that were available during my care of the patient were reviewed by me and considered in my medical decision making (see chart for details).    MDM Rules/Calculators/A&P                          BP 137/83   Pulse 71   Temp 98 F (36.7 C) (Oral)   Resp (!) 21   SpO2 98%   Final Clinical Impression(s) / ED Diagnoses Final diagnoses:  Atypical chest pain    Rx / DC Orders ED Discharge Orders    None     5:11 PM Patient developed chest pain 2 hours ago.  Admits to recent cocaine use.  Also reported having trouble sleeping and having racing thoughts but denies any SI or HI.  He is overall well-appearing.  Work-up initiated.  8:36 PM EKG labs and troponins are unremarkable however patient eloped without notifying anyone.   Fayrene Helper, PA-C 05/28/20 2037    Rolan Bucco, MD 05/28/20 [phone]

## 2020-06-02 ENCOUNTER — Other Ambulatory Visit: Payer: Self-pay

## 2020-06-02 ENCOUNTER — Emergency Department (HOSPITAL_COMMUNITY)
Admission: EM | Admit: 2020-06-02 | Discharge: 2020-06-03 | Disposition: A | Discharge status: Home / Self Care | Payer: Self-pay | Attending: Emergency Medicine | Admitting: Emergency Medicine

## 2020-06-02 DIAGNOSIS — F1190 Opioid use, unspecified, uncomplicated: Secondary | ICD-10-CM | POA: Diagnosis present

## 2020-06-02 DIAGNOSIS — Z20822 Contact with and (suspected) exposure to covid-19: Secondary | ICD-10-CM | POA: Insufficient documentation

## 2020-06-02 DIAGNOSIS — S50811A Abrasion of right forearm, initial encounter: Secondary | ICD-10-CM | POA: Insufficient documentation

## 2020-06-02 DIAGNOSIS — R45851 Suicidal ideations: Secondary | ICD-10-CM | POA: Insufficient documentation

## 2020-06-02 DIAGNOSIS — F1420 Cocaine dependence, uncomplicated: Secondary | ICD-10-CM | POA: Diagnosis present

## 2020-06-02 DIAGNOSIS — F17210 Nicotine dependence, cigarettes, uncomplicated: Secondary | ICD-10-CM | POA: Insufficient documentation

## 2020-06-02 DIAGNOSIS — X838XXA Intentional self-harm by other specified means, initial encounter: Secondary | ICD-10-CM | POA: Insufficient documentation

## 2020-06-02 DIAGNOSIS — F1124 Opioid dependence with opioid-induced mood disorder: Secondary | ICD-10-CM | POA: Insufficient documentation

## 2020-06-02 DIAGNOSIS — Z046 Encounter for general psychiatric examination, requested by authority: Secondary | ICD-10-CM | POA: Insufficient documentation

## 2020-06-02 LAB — SALICYLATE LEVEL: Salicylate Lvl: <7 mg/dL — ABNORMAL LOW (ref 7.0–30.0)

## 2020-06-02 LAB — COMPREHENSIVE METABOLIC PANEL
ALT: 180 U/L — ABNORMAL HIGH (ref 0–44)
AST: 138 U/L — ABNORMAL HIGH (ref 15–41)
Albumin: 3.9 g/dL (ref 3.5–5.0)
Alkaline Phosphatase: 84 U/L (ref 38–126)
Anion gap: 8 (ref 5–15)
BUN: 12 mg/dL (ref 6–20)
CO2: 26 mmol/L (ref 22–32)
Calcium: 9.4 mg/dL (ref 8.9–10.3)
Chloride: 100 mmol/L (ref 98–111)
Creatinine, Ser: 0.7 mg/dL (ref 0.61–1.24)
GFR, Estimated: >60 mL/min (ref >60)
Glucose, Bld: 68 mg/dL — ABNORMAL LOW (ref 70–99)
Potassium: 4.5 mmol/L (ref 3.5–5.1)
Sodium: 134 mmol/L — ABNORMAL LOW (ref 135–145)
Total Bilirubin: 0.3 mg/dL (ref 0.3–1.2)
Total Protein: 7.6 g/dL (ref 6.5–8.1)

## 2020-06-02 LAB — CBC WITH DIFFERENTIAL/PLATELET
Abs Immature Granulocytes: 0.04 10*3/uL (ref 0.00–0.07)
Basophils Absolute: 0.1 10*3/uL (ref 0.0–0.1)
Basophils Relative: 1 %
Eosinophils Absolute: 0.3 10*3/uL (ref 0.0–0.5)
Eosinophils Relative: 4 %
HCT: 45.1 % (ref 39.0–52.0)
Hemoglobin: 14.6 g/dL (ref 13.0–17.0)
Immature Granulocytes: 1 %
Lymphocytes Relative: 29 %
Lymphs Abs: 2.4 10*3/uL (ref 0.7–4.0)
MCH: 30.5 pg (ref 26.0–34.0)
MCHC: 32.4 g/dL (ref 30.0–36.0)
MCV: 94.4 fL (ref 80.0–100.0)
Monocytes Absolute: 0.8 10*3/uL (ref 0.1–1.0)
Monocytes Relative: 10 %
Neutro Abs: 4.6 10*3/uL (ref 1.7–7.7)
Neutrophils Relative %: 55 %
Platelets: 120 10*3/uL — ABNORMAL LOW (ref 150–400)
RBC: 4.78 MIL/uL (ref 4.22–5.81)
RDW: 12.7 % (ref 11.5–15.5)
WBC: 8.2 10*3/uL (ref 4.0–10.5)
nRBC: 0 % (ref 0.0–0.2)

## 2020-06-02 LAB — RESP PANEL BY RT-PCR (FLU A&B, COVID) ARPGX2
Influenza A by PCR: NEGATIVE
Influenza B by PCR: NEGATIVE
SARS Coronavirus 2 by RT PCR: NEGATIVE

## 2020-06-02 LAB — ETHANOL: Alcohol, Ethyl (B): <10 mg/dL (ref <10)

## 2020-06-02 LAB — CBG MONITORING, ED: Glucose-Capillary: 102 mg/dL — ABNORMAL HIGH (ref 70–99)

## 2020-06-02 LAB — ACETAMINOPHEN LEVEL: Acetaminophen (Tylenol), Serum: <10 ug/mL — ABNORMAL LOW (ref 10–30)

## 2020-06-02 MED ORDER — ONDANSETRON 4 MG PO TBDP
4.0000 mg | ORAL_TABLET | Freq: Once | ORAL | Status: AC
  Administered 2020-06-02: 4 mg via ORAL
  Filled 2020-06-02: qty 1

## 2020-06-02 NOTE — BH Assessment (Signed)
TTS Counselor spoke with Gardiner Barefoot, to put pt in a private room to complete TTS assessment.  NS will call when ready.

## 2020-06-02 NOTE — ED Notes (Signed)
Drowsy, eyes start closing while he stands up at the bathroom. Patient reports he last had fentanyl last night at around 5 pm. He does daily. Patient took some cocaine to take the "edge off" today before coming. He reports he is feeling withdrawal from the fentanyl. Patient with purple psych scrubs on and no belongings with him. He reports feeling suicidal and would overdose on dope. Patient verbalized that he is safe here as those methods are not available to him. Patient with no distress noted. Resp even and unlabored. Perfusion WNL.

## 2020-06-02 NOTE — BH Assessment (Signed)
Comprehensive Clinical Assessment (CCA) Note  06/02/2020 Robert [name] [id number]  Chief Complaint: No chief complaint on file.  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 Acute risk factors for suicide include: unemployment, social withdrawal/isolation and loss (financial, interpersonal, professional) Protective factors for this patient include: positive social support,. Considering these factors, the overall suicide risk at this point appears to be high risk. Patient is not appropriate for outpatient follow up.  Therefore, a 1:1 sitter for suicide precautions is recommended.   Disposition Robert John PA-C, patient meets inpatient criteria, [facility] AC contacted and bed available under review.   Disposition Social Worker will secure placement in the AM. Disposition discussed with Investment banker, corporate.  RN to discuss disposition with EDP.  Robert [name] is a 40 years old male who present to [facility] and unaccompanied; while at the hospital was involuntary committed.  The IVC reads "Robert [name] w/PMH of recent psychiatric admissions for SI, MDD, slimvianl induced mood disorder presents to ED for SI w/plan of overdose on fentanyl.  Last used last night.  Pt reports he has a history of Major depression and have been feeling increasingly depressed for the past three months.  Pt reports ' I am better off not being here".  Pt reports previous thoughts of suicide, also emphasized by 'taking pills and  sleeping away'.  Pt acknowledges symptoms including isolating, irritable, hopelessness, guilt and worrying.  Pt denies HI.  Pt denies AVH.  Pt denies paranoia.  Pt says he has been using heroin, methamphetamine, and crack cocaine daily, and his last use was 06/01/20.  Pt reports that he smoke cigarettes and vapes.  Pt identifies his primary stressor as the death of his girlfriend, in 04/05/2020 to overdose.  Pt reports that he is unemployed and lives with his mother and grandmother; also reports the environment is not supportive.  Pt reports a family history of both substance use and mental illness.  Pt denies any current legal problems; also denies access to guns.  Pt says his is currently receiving medication management from his PHP at [facility], unable to identify provider.  Pt reports that he receives the following medication,  Cymbalta and Abilify.  Pt reports no current weekly outpatient therapy; however, reported that he met with [facility], for MAT; stated that  they recommended for him to go to the hospital.  Pt is dressed in scrubs, alert,oriented x 4 with normal speech and restless motor behaviors.  Eye contact is starring.  Pt mood is depressed and affect is anxious.  Thought process is coherent.  Pt's insight is good and judgment is impaired.  There is no indication Pt is currently responding to internal stimuli or experiencing delusional thought content.  Pt was guarded throughout assessment.   CCA Screening, Triage and Referral (STR)  Patient Reported Information How did you hear about [place]? Self  Referral name: No data recorded Referral phone number: No data recorded  Whom do you see for routine medical problems? Other (Comment) ([place])  Practice/Facility Name: No data  recorded Practice/Facility Phone Number: No data recorded Name of Contact: No data recorded Contact Number: No data recorded Contact Fax Number: No data recorded Prescriber Name: No data recorded Prescriber  Address (if known): No data recorded  What Is the Reason for Your Visit/Call Today? SI  How Long Has This Been Causing You Problems? > than 6 months  What Do You Feel Would Help You the Most Today? Alcohol or Drug Use Treatment; Treatment for Depression or other mood problem   Have You Recently Been in Any Inpatient Treatment (Hospital/Detox/Crisis Center/28-Day Program)? No  Name/Location of Program/Hospital:No data recorded How Long Were You There? No data recorded When Were You Discharged? No data recorded  Have You Ever Received Services From [facility] Before? Yes  Who Do You See at [facility]? 04/29/20   Have You Recently Had Any Thoughts About Hurting Yourself? Yes  Are You Planning to Commit Suicide/Harm Yourself At This time? No   Have you Recently Had Thoughts About [place]? No  Explanation: No data recorded  Have You Used Any Alcohol or Drugs in the Past 24 Hours? Yes  How Long Ago Did You Use Drugs or Alcohol? 1000  What Did You Use and How Much? heroin   Do You Currently Have a Therapist/Psychiatrist? No  Name of Therapist/Psychiatrist: No data recorded  Have You Been Recently Discharged From Any Office Practice or Programs? No  Explanation of Discharge From Practice/Program: No data recorded    CCA Screening Triage Referral Assessment Type of Contact: Tele-Assessment  Is this Initial or Reassessment? Initial Assessment  Date Telepsych consult ordered in [facility]:  06/02/2020  Time Telepsych consult ordered in [facility]:  No data recorded  Patient Reported Information Reviewed? Yes  Patient Left Without Being Seen? No data recorded Reason for Not Completing Assessment: No data recorded  Collateral Involvement: Pt reports no support person   Does Patient Have a Court Appointed Legal Guardian? No data recorded Name and Contact of Legal Guardian: -- (n/a)  If Minor and Not Living with Parent(s), Who has Custody? n/a  Is CPS involved or ever  been involved? Never  Is APS involved or ever been involved? Never   Patient Determined To Be At Risk for Harm To Self or Others Based on Review of Patient Reported Information or Presenting Complaint? Yes, for Self-Harm  Method: No data recorded Availability of Means: No data recorded Intent: No data recorded Notification Required: No data recorded Additional Information for Danger to Others Potential: No data recorded Additional Comments for Danger to Others Potential: No data recorded Are There Guns or Other Weapons in Your Home? No data recorded Types of Guns/Weapons: No data recorded Are These Weapons Safely Secured?                            No data recorded Who Could Verify You Are Able To Have These Secured: No data recorded Do You Have any Outstanding Charges, Pending Court Dates, Parole/Probation? No data recorded Contacted To Inform of Risk of Harm To Self or Others: Law Enforcement   Location of Assessment: [facility] ED   Does Patient Present under Involuntary Commitment? Yes  IVC Papers Initial File Date: 06/02/2020   [place] of Residence: [place]   Patient Currently Receiving the Following Services: Medication Management; SAIOP (Substance Abuse Intensive Outpatient Program   Determination of Need: Emergent (2 hours)   Options For Referral: Chemical Dependency Intensive Outpatient Therapy (CDIOP)  CCA Biopsychosocial Intake/Chief Complaint:  SI, depression  Current Symptoms/Problems: isolating, crying,irritable, hopelessness, guilt, fatigue   Patient Reported Schizophrenia/Schizoaffective Diagnosis in Past: No   Strengths: Asking for help  Preferences: UTA  Abilities: Hunting   Type of Services Patient Feels are Needed: Assistance with substance use   Initial Clinical Notes/Concerns: depressed/irritable mood   Mental Health Symptoms Depression:  Difficulty Concentrating; Fatigue; Hopelessness; Increase/decrease in appetite; Sleep (too much or  little); Worthlessness   Duration of Depressive symptoms: Greater than two weeks   Mania:  None   Anxiety:   Difficulty concentrating; Fatigue; Irritability; Restlessness; Sleep; Worrying   Psychosis:  None   Duration of Psychotic symptoms: No data recorded  Trauma:  Guilt/shame (Pt reports his girlfriend died three months ago 04/03/2022) to overdose, re-experience of traumatic event.)   Obsessions:  Disrupts routine/functioning; Recurrent & persistent thoughts/impulses/images   Compulsions:  Disrupts with routine/functioning; Repeated behaviors/mental acts   Inattention:  Avoids/dislikes activities that require focus; Does not follow instructions (not oppositional)   Hyperactivity/Impulsivity:  N/A   Oppositional/Defiant Behaviors:  None   Emotional Irregularity:  Chronic feelings of emptiness; Frantic efforts to avoid abandonment; Mood lability; Recurrent suicidal behaviors/gestures/threats   Other Mood/Personality Symptoms:  depressed/irritable mood    Mental Status Exam Appearance and self-care  Stature:  Average   Weight:  Overweight   Clothing:  -- (Pt dressed in scrubs)   Grooming:  Normal   Cosmetic use:  Age appropriate   Posture/gait:  Normal   Motor activity:  Agitated; Restless   Sensorium  Attention:  Inattentive   Concentration:  Preoccupied   Orientation:  Object; Person; Place; Situation   Recall/memory:  Normal   Affect and Mood  Affect:  Negative   Mood:  Depressed   Relating  Eye contact:  Staring   Facial expression:  Responsive   Attitude toward examiner:  Guarded   Thought and Language  Speech flow: Slow   Thought content:  Suspicious   Preoccupation:  Guilt   Hallucinations:  None   Organization:  No data recorded  [employer] of Knowledge:  Fair   Intelligence:  Average   Abstraction:  Concrete   Judgement:  Impaired   Reality Testing:  Adequate   Insight:  Good   Decision Making:  Paralyzed    Social Functioning  Social Maturity:  Irresponsible   Social Judgement:  Victimized   Stress  Stressors:  Family conflict   Coping Ability:  Deficient supports   Skill Deficits:  Decision making; Responsibility; Self-control   Supports:  Support needed     Religion: Religion/Spirituality How Might This Affect Treatment?: UTA  Leisure/Recreation: Leisure / Recreation Do You Have Hobbies?: Yes Leisure and Hobbies: Advertising copywriter  Exercise/Diet: Exercise/Diet Do You Exercise?: No Have You Gained or Lost A Significant Amount of Weight in the Past Six Months?: No Do You Follow a Special Diet?: No (Pt reports appetite has decreased, no desire for food.) Do You Have Any Trouble Sleeping?: Yes Explanation of Sleeping Difficulties: Pt reports that he have not slepted in two days. Pt reports continue to awaken during the night   CCA Employment/Education Employment/Work Situation: Employment / Work Situation Employment situation: Unemployed Patient's job has been impacted by current illness: No What is the longest time patient has a held a job?: UTA Where was the patient employed at that time?: UTA Has patient ever been in the [employer]?:  ([place])  Education: Education Is Patient Currently Attending School?: No Last Grade Completed: 10 Name  of High School: [employer] Did Teacher, adult education From [employer]?: No Did You Attend College?: No Did You Attend Graduate School?: No Did You Have Any Special Interests In School?: n/a Did You Have An Individualized Education Program (IIEP):  (UTA) Did You Have Any Difficulty At School?:  (UTA) Patient's Education Has Been Impacted by Current Illness:  (UTA)   CCA Family/Childhood History Family and Relationship History: Family history Are you sexually active?:  (UTA) What is your sexual orientation?: UTA Has your sexual activity been affected by drugs, alcohol, medication, or emotional stress?: UTA Does patient have children?:  No  Childhood History:  Childhood History By whom was/is the patient raised?: Mother,Grandparents Additional childhood history information: UTA Description of patient's relationship with caregiver when they were a child: distance Patient's description of current relationship with people who raised him/her: Pt reports lack of trust due to substance use. How were you disciplined when you got in trouble as a child/adolescent?: UTA Does patient have siblings?: No Did patient suffer any verbal/emotional/physical/sexual abuse as a child?: No Did patient suffer from severe childhood neglect?: No Has patient ever been sexually abused/assaulted/raped as an adolescent or adult?: No Was the patient ever a victim of a crime or a disaster?: No Witnessed domestic violence?: No Has patient been affected by domestic violence as an adult?: No  Child/Adolescent Assessment:     CCA Substance Use Alcohol/Drug Use: Alcohol / Drug Use Pain Medications: See MAR Prescriptions: See MAR Over the Counter: See MAR History of alcohol / drug use?: Yes Longest period of sobriety (when/how long): 18 mos (began Oct 2015) most recent peroid of sobriety was 15 mos Negative Consequences of Use: Financial,Legal,Personal relationships,Work / School Withdrawal Symptoms: Agitation,Tingling Substance #1 Name of Substance 1: Heroin 1 - Age of First Use: 16 1 - Amount (size/oz): UTA 1 - Frequency: ongoing 1 - Duration: daily 1 - Last Use / Amount: 06/01/20 1 - Method of Aquiring: UTA 1- Route of Use: snorted Substance #2 Name of Substance 2: Fetanyl 2 - Age of First Use: 16 2 - Amount (size/oz): varies 2 - Frequency: "I only drink when I am high....it's not often" 2 - Duration: "Since I was 71 or 40 yrs old old" 2 - Last Use / Amount: 09/16/2019; "Couple of 22 ounce beers" Substance #3 Name of Substance 3: Methamphetamine 3 - Age of First Use: 40 yrs old 3 - Amount (size/oz): "a gram" 3 - Frequency: ongoing 3 -  Duration: daily 3 - Last Use / Amount: 06/01/20 3 - Method of Aquiring: UTA 3 - Route of Substance Use: UTA                   ASAM's:  Six Dimensions of Multidimensional Assessment  Dimension 1:  Acute Intoxication and/or Withdrawal Potential:   Dimension 1:  Description of individual's past and current experiences of substance use and withdrawal: Pt reports that he is having withdraw and continued to used daily  Dimension 2:  Biomedical Conditions and Complications:   Dimension 2:  Description of patient's biomedical conditions and  complications: Pt did not report biomedical conditions  Dimension 3:  Emotional, Behavioral, or Cognitive Conditions and Complications:  Dimension 3:  Description of emotional, behavioral, or cognitive conditions and complications: depression  Dimension 4:  Readiness to Change:  Dimension 4:  Description of Readiness to Change criteria: Pt report that he is willing to engaged in treatment.  Dimension 5:  Relapse, Continued use, or Continued Problem Potential:  Dimension 5:  Relapse, continued use, or continued problem potential critiera description: The enviroment is not supportive  Dimension 6:  Recovery/Living Environment:  Dimension 6:  Recovery/Iiving environment criteria description: Pt reports negative enviroment  ASAM Severity Score: ASAM's Severity Rating Score: 14  ASAM Recommended Level of Treatment: ASAM Recommended Level of Treatment: Level III Residential Treatment   Substance use Disorder (SUD) Substance Use Disorder (SUD)  Checklist Symptoms of Substance Use: Continued use despite having a persistent/recurrent physical/psychological problem caused/exacerbated by use,Continued use despite persistent or recurrent social, interpersonal problems, caused or exacerbated by use,Evidence of withdrawal (Comment),Large amounts of time spent to obtain, use or recover from the substance(s),Social, occupational, recreational activities given up or reduced  due to use,Substance(s) often taken in larger amounts or over longer times than was intended  Recommendations for Services/Supports/Treatments: Recommendations for Services/Supports/Treatments Recommendations For Services/Supports/Treatments: SAIOP (Substance Abuse Intensive Outpatient Program),Residential-Level 2  DSM5 Diagnoses: Patient Active Problem List   Diagnosis Date Noted  . Seasonal allergies 04/26/2020  . Cellulitis 11/07/2019  . Neuropathy 10/12/2019  . Opioid use disorder 09/28/2019  . Chronic hepatitis C ([place]) 08/07/2019  . Nicotine dependence, cigarettes, uncomplicated 91/63/8466  . Cocaine use disorder, moderate, dependence ([place]) 07/07/2017  . 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 Alcoholism ([place]) 09/12/2014      Referrals to Alternative Service(s): Referred to Alternative Service(s):   Place:   Date:   Time:    Referred to Alternative Service(s):   Place:   Date:   Time:    Referred to Alternative Service(s):   Place:   Date:   Time:    Referred to Alternative Service(s):   Place:   Date:   Time:     Leonides Schanz, Counselor

## 2020-06-02 NOTE — ED Triage Notes (Signed)
Pt here from home with c/o SI thought along with drug use , iV and cocaine , hx of same , pt was at [facility] 5 days ago

## 2020-06-02 NOTE — ED Notes (Signed)
Patient is resting comfortably. Patient provided with sandwich, juice. Will check CBG when he is finished. Patient reports nausea is improved.

## 2020-06-02 NOTE — ED Triage Notes (Signed)
Emergency Medicine Provider Triage Evaluation Note  Robert [name] , a 40 y.o. male  was evaluated in triage.  Pt complains of suicidal thoughts.  Has a plan to overdose on fentanyl.  Has the means to carry pout his plan.  Has attempted intentional overdose in the past.  Reports previously hospitalized for SI in [place] few weeks prior.  Endorses fentanyl use last night and cocaine use today.  Reports previously on Suboxone; has not taken in 3-4 days.    Review of Systems  Positive: SI Negative: HI, visual hallucinations, auditory hallucinations   Physical Exam  BP 127/78 (BP Location: Right Arm)   Pulse 72   Temp 98 F (36.7 C) (Oral)   Resp 16   SpO2 96%  Gen:   Awake, no distress   HEENT:  Atraumatic  Resp:  Normal effort  Cardiac:  Normal rate  Abd:   Nondistended, nontender  MSK:   Moves extremities without difficulty  Neuro:  Speech clear   Medical Decision Making  Medically screening exam initiated at 12:20 PM.  Appropriate orders placed.  Robert [name] was informed that the remainder of the evaluation will be completed by another provider, this initial triage assessment does not replace that evaluation, and the importance of remaining in the ED until their evaluation is complete.  Clinical Impression   Suicidal thoughts with plan and means to carry out plan.  Reports previous attempt at intentional overdose.    The patient appears stable so that the remainder of the work up may be completed by another provider.     Haskel Schroeder, [place] 06/02/20 1230

## 2020-06-02 NOTE — ED Provider Notes (Signed)
[facility] EMERGENCY DEPARTMENT Provider Note   CSN: [id number] Arrival date & time: 06/02/20  1200     History No chief complaint on file.   Robert [name] is a 40 y.o. male with past medical history of recent psychiatric admissions, SI, MDD, substance abuse mood disorder, substance use that presents emerge department today for suicidal ideations with plan to resolve Tylenol.  Patient states that Robert [name] last used fentanyl last night.  States that Robert [name] was previously hospitalized for SI in [place] few weeks prior, states that Robert [name] wanted to leave.  Patient states that Robert [name] also used cocaine this morning, no other substance use.  Patient reports that Robert [name] was previously on Suboxone, has not taken in the past couple of days.  States that Robert [name] has been taking his other psychiatric medications.  States that his been feeling suicidal for the past couple months, worsened over the last couple of days for certain losses in his family.  Denies any pain anywhere, asymptomatic currently, asking for help.  Denies any homicidal intent, denies any hallucinations.  Robert [name] does report a previous attempt at intentional overdose.  HPI     Past Medical History:  Diagnosis Date  . Anxiety   . Arthritis   . Cellulitis 09/2018  . Cellulitis and abscess of upper extremity 02/28/2018  . Cellulitis of hand, right 08/07/2019  . Cellulitis of leg, right   . Cellulitis of right hand 08/08/2019  . Chronic hepatitis C (HCC) 08/07/2019  . Depression   . Hepatitis    via pts mother  . 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 Nicotine dependence, cigarettes, uncomplicated 08/07/2019  . Right arm cellulitis 08/07/2019    Patient Active Problem List   Diagnosis Date Noted  . Seasonal allergies 04/26/2020  . Cellulitis 11/07/2019  . Neuropathy 10/12/2019  . Opioid use disorder 09/28/2019  . Chronic hepatitis C (HCC) 08/07/2019  . Nicotine dependence, cigarettes,  uncomplicated 08/07/2019  . Cocaine use disorder, moderate, dependence (HCC) 07/07/2017  . 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 Alcoholism (HCC) 09/12/2014    Past Surgical History:  Procedure Laterality Date  . GALLBLADDER SURGERY         Family History  Problem Relation Age of Onset  . Diabetes Mother   . Diabetes Maternal Grandfather     Social History   Tobacco Use  . Smoking status: Current Every Day Smoker    Packs/day: 0.50    Types: Cigarettes, E-cigarettes  . Smokeless tobacco: Never Used  . Tobacco comment: .5 PPD and vaping  Vaping Use  . Vaping Use: Some days  Substance Use Topics  . Alcohol use: Not Currently    Alcohol/week: 0.0 standard drinks  . Drug use: Not Currently    Types: Cocaine, Heroin, Methamphetamines    Comment: Sober x weeks     Home Medications Prior to Admission medications   Medication Sig Start Date End Date Taking? Authorizing Provider  ARIPiprazole (ABILIFY) 2 MG tablet Take 1 tablet (2 mg total) by mouth at bedtime. 04/25/20   Mayers, Cari S, PA-C  Buprenorphine HCl-Naloxone HCl 8-2 MG FILM Place 1 Film under the tongue in the morning and at bedtime. 04/18/20   Tyson Alias, MD  cetirizine (ZYRTEC) 10 MG tablet Take 1 tablet (10 mg total) by mouth daily. 04/25/20   Mayers, Cari S, PA-C  clindamycin (CLEOCIN) 150 MG capsule TAKE 1 CAPSULE (150 MG TOTAL) BY MOUTH FOUR TIMES DAILY FOR 10 DAYS.  03/10/20 03/10/21  Katsadouros, Vasilios, MD  DULoxetine (CYMBALTA) 30 MG capsule TAKE 1 CAPSULE (30 MG TOTAL) BY MOUTH DAILY. 01/11/20 01/10/21  Storm Frisk, MD  DULoxetine (CYMBALTA) 60 MG capsule Take 1 capsule (60 mg total) by mouth daily. 04/25/20 05/25/20  Mayers, Cari S, PA-C  fluticasone (FLONASE) 50 MCG/ACT nasal spray Place 2 sprays into both nostrils daily. 04/25/20   Mayers, Cari S, PA-C  gabapentin (NEURONTIN) 300 MG capsule Take 1 capsule (300 mg total) by mouth 3 (three) times daily. 04/25/20 05/25/20  Mayers, Cari S,  PA-C  potassium chloride (KLOR-CON) 10 MEQ tablet Take 1 tablet (10 mEq total) by mouth daily. 03/07/20   Mare Ferrari, PA-C  traZODone (DESYREL) 50 MG tablet Take 1 tablet (50 mg total) by mouth at bedtime as needed for sleep. 04/25/20   Mayers, Cari S, PA-C    Allergies    Hydrocodone and Codeine  Review of Systems   Review of Systems  Constitutional: Negative for diaphoresis, fatigue and fever.  Eyes: Negative for visual disturbance.  Respiratory: Negative for shortness of breath.   Cardiovascular: Negative for chest pain.  Gastrointestinal: Negative for nausea and vomiting.  Musculoskeletal: Negative for back pain and myalgias.  Skin: Negative for color change, pallor, rash and wound.  Neurological: Negative for syncope, weakness, light-headedness, numbness and headaches.  Psychiatric/Behavioral: Positive for suicidal ideas. Negative for behavioral problems, confusion and hallucinations. The patient is not nervous/anxious and is not hyperactive.     Physical Exam Updated Vital Signs BP 127/78 (BP Location: Right Arm)   Pulse 72   Temp 98 F (36.7 C) (Oral)   Resp 16   SpO2 96%   Physical Exam Constitutional:      General: Robert [name] is not in acute distress.    Appearance: Normal appearance. Robert [name] is not ill-appearing, toxic-appearing or diaphoretic.     Comments: Alert and oriented, slightly sleepy, is cooperative.  HENT:     Head: Normocephalic and atraumatic.  Eyes:     Extraocular Movements: Extraocular movements intact.     Pupils: Pupils are equal, round, and reactive to light.  Cardiovascular:     Rate and Rhythm: Normal rate and regular rhythm.     Pulses: Normal pulses.  Pulmonary:     Effort: Pulmonary effort is normal.     Breath sounds: Normal breath sounds.  Musculoskeletal:        General: Normal range of motion.  Skin:    General: Skin is warm and dry.     Capillary Refill: Capillary refill takes less than 2 seconds.     Comments: Track marks throughout,  scratches noted on right forearm  Neurological:     General: No focal deficit present.     Mental Status: Robert [name] is alert and oriented to person, place, and time.  Psychiatric:        Mood and Affect: Mood normal.        Behavior: Behavior normal.        Thought Content: Thought content normal.     ED Results / Procedures / Treatments   Labs (all labs ordered are listed, but only abnormal results are displayed) Labs Reviewed  COMPREHENSIVE METABOLIC PANEL - Abnormal; Notable for the following components:      Result Value   Sodium 134 (*)    Glucose, Bld 68 (*)    AST 138 (*)    ALT 180 (*)    All other components within normal limits  CBC WITH DIFFERENTIAL/PLATELET -  Abnormal; Notable for the following components:   Platelets 120 (*)    All other components within normal limits  SALICYLATE LEVEL - Abnormal; Notable for the following components:   Salicylate Lvl <7.0 (*)    All other components within normal limits  ACETAMINOPHEN LEVEL - Abnormal; Notable for the following components:   Acetaminophen (Tylenol), Serum <10 (*)    All other components within normal limits  RESP PANEL BY RT-PCR (FLU A&B, COVID) ARPGX2  ETHANOL  RAPID URINE DRUG SCREEN, HOSP PERFORMED  CBG MONITORING, ED    EKG None  Radiology No results found.  Procedures Procedures   Medications Ordered in ED Medications  ondansetron (ZOFRAN-ODT) disintegrating tablet 4 mg (4 mg Oral Given 06/02/20 1459)    ED Course  I have reviewed the triage vital signs and the nursing notes.  Pertinent labs & imaging results that were available during my care of the patient were reviewed by me and considered in my medical decision making (see chart for details).    MDM Rules/Calculators/A&P                          Richards Pherigo is a 40 y.o. male with past medical history of recent psychiatric admissions, SI, MDD, substance abuse mood disorder, substance use that presents emergency department today for suicidal  ideations with plan to overdose on fentanyl.  With patient reporting attempted intentional overdose with use of fentanyl last night and substance abuse this morning, will place under IVC. Concerned about pt eloping.  Patient will need to be medically cleared and then seen by psychiatry.  Work-up today remarkable for thrombocytopenia, appears to be new.  No leukopenia or anemia.  Could be from liver disease. Pt is stable and appears well.  AST and ALT are elevated, this appears to be chronic.  Glucose 68, will give patient some apple juice and recheck. Patient has been medically cleared, awaiting psych disposition.  Repeat CBG 102. Pt is now in purple zone, placed under provider default.    Final Clinical Impression(s) / ED Diagnoses Final diagnoses:  Suicidal ideations    Rx / DC Orders ED Discharge Orders    None       Farrel Gordon, PA-C 06/02/20 1534    Benjiman Core, MD 06/02/20 [phone]

## 2020-06-02 NOTE — ED Notes (Signed)
TTS in process 

## 2020-06-03 ENCOUNTER — Ambulatory Visit (HOSPITAL_COMMUNITY)
Admission: EM | Admit: 2020-06-03 | Discharge: 2020-06-04 | Disposition: A | Discharge status: Home / Self Care | Payer: No Payment, Other | Attending: Student | Admitting: Student

## 2020-06-03 ENCOUNTER — Other Ambulatory Visit: Payer: Self-pay

## 2020-06-03 DIAGNOSIS — F1590 Other stimulant use, unspecified, uncomplicated: Secondary | ICD-10-CM | POA: Insufficient documentation

## 2020-06-03 DIAGNOSIS — F1420 Cocaine dependence, uncomplicated: Secondary | ICD-10-CM

## 2020-06-03 DIAGNOSIS — R45851 Suicidal ideations: Secondary | ICD-10-CM | POA: Insufficient documentation

## 2020-06-03 DIAGNOSIS — F32A Depression, unspecified: Secondary | ICD-10-CM | POA: Insufficient documentation

## 2020-06-03 DIAGNOSIS — Z79899 Other long term (current) drug therapy: Secondary | ICD-10-CM | POA: Insufficient documentation

## 2020-06-03 DIAGNOSIS — F1994 Other psychoactive substance use, unspecified with psychoactive substance-induced mood disorder: Secondary | ICD-10-CM | POA: Insufficient documentation

## 2020-06-03 DIAGNOSIS — F1410 Cocaine abuse, uncomplicated: Secondary | ICD-10-CM

## 2020-06-03 DIAGNOSIS — F1510 Other stimulant abuse, uncomplicated: Secondary | ICD-10-CM

## 2020-06-03 DIAGNOSIS — Z5900 Homelessness unspecified: Secondary | ICD-10-CM | POA: Insufficient documentation

## 2020-06-03 DIAGNOSIS — R454 Irritability and anger: Secondary | ICD-10-CM | POA: Insufficient documentation

## 2020-06-03 DIAGNOSIS — F1490 Cocaine use, unspecified, uncomplicated: Secondary | ICD-10-CM | POA: Insufficient documentation

## 2020-06-03 DIAGNOSIS — F1120 Opioid dependence, uncomplicated: Secondary | ICD-10-CM | POA: Insufficient documentation

## 2020-06-03 DIAGNOSIS — F17210 Nicotine dependence, cigarettes, uncomplicated: Secondary | ICD-10-CM | POA: Insufficient documentation

## 2020-06-03 MED ORDER — ALUM & MAG HYDROXIDE-SIMETH 200-200-20 MG/5ML PO SUSP: 30.0000 mL | ORAL | Status: DC | PRN

## 2020-06-03 MED ORDER — ACETAMINOPHEN 325 MG PO TABS: 650.0000 mg | ORAL_TABLET | Freq: Four times a day (QID) | ORAL | Status: DC | PRN

## 2020-06-03 MED ORDER — LORATADINE 10 MG PO TABS
10.0000 mg | ORAL_TABLET | Freq: Every day | ORAL | Status: DC
  Administered 2020-06-04: 10 mg via ORAL
  Filled 2020-06-03: qty 1

## 2020-06-03 MED ORDER — GABAPENTIN 100 MG PO CAPS
100.0000 mg | ORAL_CAPSULE | Freq: Three times a day (TID) | ORAL | Status: DC
  Administered 2020-06-03 (×2): 100 mg via ORAL
  Filled 2020-06-03 (×2): qty 1

## 2020-06-03 MED ORDER — TRAZODONE HCL 50 MG PO TABS
50.0000 mg | ORAL_TABLET | Freq: Every evening | ORAL | Status: DC | PRN
  Administered 2020-06-04: 50 mg via ORAL
  Filled 2020-06-03: qty 1

## 2020-06-03 MED ORDER — FLUTICASONE PROPIONATE 50 MCG/ACT NA SUSP
2.0000 | Freq: Every day | NASAL | Status: DC
  Administered 2020-06-04: 2 via NASAL
  Filled 2020-06-03: qty 16

## 2020-06-03 MED ORDER — MAGNESIUM HYDROXIDE 400 MG/5ML PO SUSP: 30.0000 mL | Freq: Every day | ORAL | Status: DC | PRN

## 2020-06-03 MED ORDER — HYDROXYZINE HCL 25 MG PO TABS
25.0000 mg | ORAL_TABLET | Freq: Three times a day (TID) | ORAL | Status: DC | PRN
  Administered 2020-06-04: 25 mg via ORAL
  Filled 2020-06-03: qty 1

## 2020-06-03 NOTE — Discharge Instructions (Signed)
Intensive Outpatient Programs  [facility]    [facility] [address]     [address] [place],  [place]     [place], [place] [phone]      [phone]  [name]   [facility]  (Inpatient and outpatient)  [phone] (Suboxone and Methadone) [address]           [phone]           [facility]    [facility] - Intensive Outpatient [address]     [address] [place], [place] [address]     [place], [place]  [phone]      [phone]  [facility] (Outpatient, Inpatient, Chemical  [facility] (Groups and Residental) (insurance only) [phone]    [place], [place]          [phone]       [facility]    [facility] (for caregivers and family) [address]     [address] [place], [place]     [place], [place] [phone]      [phone]  Residential Treatment Programs  [facility]  [facility](2 years) Residential: 90 days)  [facility] ([facility]      [address] [place], [place]     [place], [place] [phone]      [phone] or [phone]  [facility]    [facility] [address]      [address] [place], [place]     [place], [place] [phone]      [phone]  [facility]   [facility] ([facility]) [address]     [address] [place], [place] [address]     [place], [place] [phone]      [phone] Admissions: 8am-3pm M-F  [facility]: Residential Program [phone] Days)              [facility]: [facility]  [place], [place]     [place], [place]  [phone] or [phone]    (Walk in Hours over the weekend or by referral)   [facility]: [facility]:[phone] (for crisis  response 24 hours a day)  If you are in need of a shelter please call [facility] ([facility]) at [phone] between the hours of 9am-5pm Mon-Fri.  [facility] will contact all the local shelters to find openings.  Right now they are requiring people to quarantine at a hotel before they can go to an open bed but [facility] may be able to set that up as well.  They are not open on weekends.  On Monday-Friday morning at 8 am until 1 pm, you can also go to the [employer] (see below) to seek shelter in the [facility] prior to entering a shelter. You can also call the number provided (see the above paragraph) to seek placement into the program by calling [facility].   Interactive resource center [facility]) [address] [place], [place] [address] Phone: [phone] Fax: [phone]  For Free Breakfasts and Lunches 7 days a week you can go to: [facility] [address] [place], [place], [place] [address] Hours: Open  today  8AM-5PM Phone: [phone] Breakfast: 6:30-7:30 am Lunch served: 10:40 am - 12:40pm         DAY [facility]) M-F 8am-3pm   [address] [place], [place] [address]   [phone] Services include: laundry, barbering, support groups, case management, phone  & computer access, showers, AA/NA mtgs, mental health/substance abuse nurse, job skills class, disability information, VA assistance, spiritual classes, etc.   [facility] [address] [place], [place]   [phone] Provides breakfast each weekday morning except Wednesdays, and an evening community meal every Friday. Access to showers is available during breakfast hours and telephones for seeking work are also provided. Also offers job referral and counseling for the homeless and unemployed.  HOMELESS SHELTERS [facility]   [employer] [phone] [address]     [facility] [address] [place], [place] [address]     [address], [place]  [phone]      [phone]  [facility]   [facility] [address] [place]    [address] [place] [address] [place] [address]     [place], [place] [address] [phone]       [phone]  [facility] (women only) [address] [place], [place] [address] [phone]  [facility]: Marcy Panning (overflow shelter: [address] [place], [place] check in at 6pm for placement in local shelter).  [address] [place], [place], [place] [address] Phone: [phone]  Crisis Services [facility]     [facility]   Crisis Services      [facility] [phone]. [address]     [address] [place], [place] [address]     [place], [place] [address]    [facility] Mobile Crisis Management - [phone]

## 2020-06-03 NOTE — ED Provider Notes (Signed)
Emergency Medicine Observation Re-evaluation Note  Robert [name] is a 40 y.o. male, seen on rounds today.  Pt initially presented to the ED for complaints of No chief complaint on file. Currently, the patient is alert and conversant at 12:10 PM.  He states he feels suicidal, with a plan to overdose on drugs, and states that he is "depressed."  He has been intermittently using his depression medicines, not taking them when he is using drugs and then restarting them at times.  He relates being in various treatment programs including halfway house, and Suboxone.  He is not currently taking other medications.  He claims to be nauseated, but denies other medical complaints.  He states that "I want to get my psychiatric problems fixed before I get discharged."  He requests that I start some gabapentin to help his discomfort.   Physical Exam  BP 105/62 (BP Location: Right Arm)   Pulse 60   Temp 98 F (36.7 C) (Oral)   Resp 16   SpO2 96%  Physical Exam General: Conversant, resting in bed Cardiac: Normal heart rate Lungs: Normal respiratory Psych: Does not appear to be responding to internal stimuli  ED Course / MDM  EKG:   I have reviewed the labs performed to date as well as medications administered while in observation.  Recent changes in the last 24 hours include he was initially cleared by psychiatry at 11:50 AM.  Patient was presented options for seeking care in the community but declined them, then reiterated suicidality and plan to overdose.  This has been communicated to TTS, who will continue to evaluate him.  12:52 PM.  I have ordered gabapentin 100 mg 3 times daily, to treat his symptoms.  Plan  Current plan is for psychiatric care, placement as needed. Patient is under full IVC at this time.   Mancel Bale, MD 06/03/20 1252

## 2020-06-03 NOTE — Progress Notes (Signed)
   06/03/20 1200  TOC ED Mini Assessment  TOC Time spent with patient (minutes): 15  PING Used in TOC Assessment No  Admission or Readmission Diverted No  Interventions which prevented an admission or readmission Homeless Screening (substance use)  What brought you to the Emergency Department?  Suicidal ideation  Barriers to Discharge Inadequate or no insurance;ED Patient Refusing Discharge  Barrier interventions CSW screened patient for SA resources and homeless supports. CSW reviewed community options. CSW noted patient became agitated and walked out of the room refusing further screening. CSW noted patient stated "Y'all just sending me to the street. I'm just going to come back to the ED. I need to speak to that lady again." RN notified.  Means of departure Not know

## 2020-06-03 NOTE — ED Notes (Signed)
RN waiting to confirm acceptance to [facility] based on last documentation from [facility]

## 2020-06-03 NOTE — Consult Note (Addendum)
Telepsych Consultation   Reason for Consult:  Psychiatry Provider Reassessment Referring Physician:   Location of Patient: [name] Location of Provider: Behavioral Health TTS Department  Patient Identification: Robert [name] MRN:  [id number] Principal Diagnosis: Cocaine use disorder, moderate, dependence (HCC) Diagnosis:  Principal Problem:   Cocaine use disorder, moderate, dependence (HCC) Active Problems:   Opioid use disorder   Total Time spent with patient: 30 minutes  Subjective:   Robert [name] is a 40 y.o. male patient admitted with suicidal ideations.  Today patient states "I was just trying to tell somebody something so that I could get some help."  He reports "I need Suboxone bad, or I will not get better."  Patient insightful today states "I need long-term rehab."  Patient states that he will be able to keep himself and those around him safe.  HPI:   Patient reassessed by nurse practitioner.  He is alert and oriented, answers appropriately.  Robert [name] denies suicidal and homicidal ideations currently.  He denies any history of self-harm or suicide attempts.  He contracts for safety with this Clinical research associate.  He reports he would like "long-term rehab somewhere that I can stay for a long time."  Robert [name] reports recent stressor includes being dismissed from an [place] house 2 days ago, reported he only resided at this home for 2 days before "someone was getting on my nerves and would not quit getting on my nerves."  Robert [name] states he was feeling suicidal yesterday, reports this has been ongoing for "a few years."  He cites his main stressor is addiction.  Drugs of choice include heroin and cocaine.  Reports used both heroin and cocaine on yesterday.  Patient reports occasional alcohol use, last drink on yesterday, amount of 1 drink.  Robert [name] reports he is not currently followed by outpatient psychiatry but has been diagnosed with depression in the past.  He reports he is not  currently taking any medications daily, most recently reports was prescribed gabapentin and Suboxone but has not taken these "for a while."  He is currently homeless in [place], denies access to weapons.  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  He endorses average sleep and appetite. He denies both auditory and visual hallucinations.  There is no evidence of delusional thought content and he denies symptoms of paranoia.  Patient offered support and encouragement.  Agrees with plan to seek substance use treatment.  Patient declines anyone to contact for collateral information at this time  Past Psychiatric History: Cocaine use disorder, opioid use disorder, alcoholism, stimulant induced mood disorder  Risk to Self:  Denies Risk to Others:  Denies Prior Inpatient Therapy:   [facility] 03/2020 Prior Outpatient Therapy:  None reported  Past Medical History:  Past Medical History:  Diagnosis Date  . Anxiety   . Arthritis   . Cellulitis 09/2018  . Cellulitis and abscess of upper extremity 02/28/2018  . Cellulitis of hand, right 08/07/2019  . Cellulitis of leg, right   . Cellulitis of right hand 08/08/2019  . Chronic hepatitis C (HCC) 08/07/2019  . Depression   . Hepatitis    via pts mother  . 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 Nicotine dependence, cigarettes, uncomplicated 08/07/2019  . Right arm cellulitis 08/07/2019    Past Surgical History:  Procedure Laterality Date  . GALLBLADDER SURGERY     Family History:  Family History  Problem Relation Age of Onset  . Diabetes  Mother   . Diabetes Maternal Grandfather    Family Psychiatric  History: None reported Social History:  Social History   Substance and Sexual Activity  Alcohol Use Not Currently  . Alcohol/week: 0.0 standard drinks     Social History   Substance and Sexual Activity  Drug Use Not Currently  . Types: Cocaine, Heroin,  Methamphetamines   Comment: Sober x weeks     Social History   Socioeconomic History  . Marital status: Single    Spouse name: Not on file  . Number of children: Not on file  . Years of education: Not on file  . Highest education level: Not on file  Occupational History  . Not on file  Tobacco Use  . Smoking status: Current Every Day Smoker    Packs/day: 0.50    Types: Cigarettes, E-cigarettes  . Smokeless tobacco: Never Used  . Tobacco comment: .5 PPD and vaping  Vaping Use  . Vaping Use: Some days  Substance and Sexual Activity  . Alcohol use: Not Currently    Alcohol/week: 0.0 standard drinks  . Drug use: Not Currently    Types: Cocaine, Heroin, Methamphetamines    Comment: Sober x weeks   . Sexual activity: Not Currently  Other Topics Concern  . Not on file  Social History Narrative  . Not on file   Social Determinants of Health   Financial Resource Strain: Not on file  Food Insecurity: Not on file  Transportation Needs: Not on file  Physical Activity: Not on file  Stress: Not on file  Social Connections: Not on file   Additional Social History:    Allergies:   Allergies  Allergen Reactions  . Hydrocodone Other (See Comments)    nausea  . Codeine Other (See Comments)    Labs:  Results for orders placed or performed during the hospital encounter of 06/02/20 (from the past 48 hour(s))  Resp Panel by RT-PCR (Flu A&B, Covid) Nasopharyngeal Swab     Status: None   Collection Time: 06/02/20 12:21 PM   Specimen: Nasopharyngeal Swab; Nasopharyngeal(NP) swabs in vial transport medium  Result Value Ref Range   SARS Coronavirus 2 by RT PCR NEGATIVE NEGATIVE    Comment: (NOTE) SARS-CoV-2 target nucleic acids are NOT DETECTED.  The SARS-CoV-2 RNA is generally detectable in upper respiratory specimens during the acute phase of infection. The lowest concentration of SARS-CoV-2 viral copies this assay can detect is 138 copies/mL. A negative result does not  preclude SARS-Cov-2 infection and should not be used as the sole basis for treatment or other patient management decisions. A negative result may occur with  improper specimen collection/handling, submission of specimen other than nasopharyngeal swab, presence of viral mutation(s) within the areas targeted by this assay, and inadequate number of viral copies(<138 copies/mL). A negative result must be combined with clinical observations, patient history, and epidemiological information. The expected result is Negative.  Fact Sheet for Patients:  BloggerCourse.comhttps://www.fda.gov/media/152166/download  Fact Sheet for Healthcare Providers:  SeriousBroker.ithttps://www.fda.gov/media/152162/download  This test is no t yet approved or cleared by the [place] FDA and  has been authorized for detection and/or diagnosis of SARS-CoV-2 by FDA under an Emergency Use Authorization (EUA). This EUA will remain  in effect (meaning this test can be used) for the duration of the COVID-19 declaration under Section 564(b)(1) of the Act, 21 U.S.C.section 360bbb-3(b)(1), unless the authorization is terminated  or revoked sooner.       Influenza A by PCR NEGATIVE NEGATIVE   Influenza  B by PCR NEGATIVE NEGATIVE    Comment: (NOTE) The Xpert Xpress SARS-CoV-2/FLU/RSV plus assay is intended as an aid in the diagnosis of influenza from Nasopharyngeal swab specimens and should not be used as a sole basis for treatment. Nasal washings and aspirates are unacceptable for Xpert Xpress SARS-CoV-2/FLU/RSV testing.  Fact Sheet for Patients: BloggerCourse.com  Fact Sheet for Healthcare Providers: SeriousBroker.it  This test is not yet approved or cleared by the [place] FDA and has been authorized for detection and/or diagnosis of SARS-CoV-2 by FDA under an Emergency Use Authorization (EUA). This EUA will remain in effect (meaning this test can be used) for the duration of  the COVID-19 declaration under Section 564(b)(1) of the Act, 21 U.S.C. section 360bbb-3(b)(1), unless the authorization is terminated or revoked.  Performed at [facility], [address], [place], [place] [address]   Comprehensive metabolic panel     Status: Abnormal   Collection Time: 06/02/20 12:28 PM  Result Value Ref Range   Sodium 134 (L) 135 - 145 mmol/L   Potassium 4.5 3.5 - 5.1 mmol/L   Chloride 100 98 - 111 mmol/L   CO2 26 22 - 32 mmol/L   Glucose, Bld 68 (L) 70 - 99 mg/dL    Comment: Glucose reference range applies only to samples taken after fasting for at least 8 hours.   BUN 12 6 - 20 mg/dL   Creatinine, Ser 6.04 0.61 - 1.24 mg/dL   Calcium 9.4 8.9 - 54.0 mg/dL   Total Protein 7.6 6.5 - 8.1 g/dL   Albumin 3.9 3.5 - 5.0 g/dL   AST 981 (H) 15 - 41 U/L   ALT 180 (H) 0 - 44 U/L   Alkaline Phosphatase 84 38 - 126 U/L   Total Bilirubin 0.3 0.3 - 1.2 mg/dL   GFR, Estimated >19 >14 mL/min    Comment: (NOTE) Calculated using the CKD-EPI Creatinine Equation (2021)    Anion gap 8 5 - 15    Comment: Performed at [facility], [address] [place], [place] [address]  Ethanol     Status: None   Collection Time: 06/02/20 12:28 PM  Result Value Ref Range   Alcohol, Ethyl (B) <10 <10 mg/dL    Comment: (NOTE) Lowest detectable limit for serum alcohol is 10 mg/dL.  For medical purposes only. Performed at [facility], [address] [place], [place] [address]   CBC with Diff     Status: Abnormal   Collection Time: 06/02/20 12:28 PM  Result Value Ref Range   WBC 8.2 4.0 - 10.5 K/uL    Comment: REPEATED TO VERIFY WHITE COUNT CONFIRMED ON SMEAR    RBC 4.78 4.22 - 5.81 MIL/uL   Hemoglobin 14.6 13.0 - 17.0 g/dL   HCT 86.5 78.4 - 69.6 %   MCV 94.4 80.0 - 100.0 fL   MCH 30.5 26.0 - 34.0 pg   MCHC 32.4 30.0 - 36.0 g/dL   RDW 29.5 28.4 - 13.2 %   Platelets 120 (L) 150 - 400 K/uL    Comment: REPEATED TO VERIFY PLATELET COUNT CONFIRMED BY SMEAR     nRBC 0.0 0.0 - 0.2 %   Neutrophils Relative % 55 %   Neutro Abs 4.6 1.7 - 7.7 K/uL   Lymphocytes Relative 29 %   Lymphs Abs 2.4 0.7 - 4.0 K/uL   Monocytes Relative 10 %   Monocytes Absolute 0.8 0.1 - 1.0 K/uL   Eosinophils Relative 4 %  Eosinophils Absolute 0.3 0.0 - 0.5 K/uL   Basophils Relative 1 %   Basophils Absolute 0.1 0.0 - 0.1 K/uL   Immature Granulocytes 1 %   Abs Immature Granulocytes 0.04 0.00 - 0.07 K/uL    Comment: Performed at [facility], [address], [place], [place] [address]  Salicylate level     Status: Abnormal   Collection Time: 06/02/20 12:28 PM  Result Value Ref Range   Salicylate Lvl <7.0 (L) 7.0 - 30.0 mg/dL    Comment: Performed at [facility], [address], [place], [place] [address]  Acetaminophen level     Status: Abnormal   Collection Time: 06/02/20 12:28 PM  Result Value Ref Range   Acetaminophen (Tylenol), Serum <10 (L) 10 - 30 ug/mL    Comment: (NOTE) Therapeutic concentrations vary significantly. A range of 10-30 ug/mL  may be an effective concentration for many patients. However, some  are best treated at concentrations outside of this range. Acetaminophen concentrations >150 ug/mL at 4 hours after ingestion  and >50 ug/mL at 12 hours after ingestion are often associated with  toxic reactions.  Performed at [facility], [address], [place], [place] [address]   POC CBG, ED     Status: Abnormal   Collection Time: 06/02/20  3:30 PM  Result Value Ref Range   Glucose-Capillary 102 (H) 70 - 99 mg/dL    Comment: Glucose reference range applies only to samples taken after fasting for at least 8 hours.    Medications:  No current facility-administered medications for this encounter.   Current Outpatient Medications  Medication Sig Dispense Refill  . ARIPiprazole (ABILIFY) 2 MG tablet Take 1 tablet (2 mg total) by mouth at bedtime. 30 tablet 0  . Buprenorphine HCl-Naloxone HCl 8-2 MG FILM Place 1 Film under the  tongue in the morning and at bedtime. 28 each 0  . cetirizine (ZYRTEC) 10 MG tablet Take 1 tablet (10 mg total) by mouth daily. 30 tablet 11  . clindamycin (CLEOCIN) 150 MG capsule TAKE 1 CAPSULE (150 MG TOTAL) BY MOUTH FOUR TIMES DAILY FOR 10 DAYS. 40 capsule 0  . DULoxetine (CYMBALTA) 30 MG capsule TAKE 1 CAPSULE (30 MG TOTAL) BY MOUTH DAILY. 30 capsule 1  . DULoxetine (CYMBALTA) 60 MG capsule Take 1 capsule (60 mg total) by mouth daily. 30 capsule 0  . fluticasone (FLONASE) 50 MCG/ACT nasal spray Place 2 sprays into both nostrils daily. 16 g 6  . gabapentin (NEURONTIN) 300 MG capsule Take 1 capsule (300 mg total) by mouth 3 (three) times daily. 90 capsule 0  . potassium chloride (KLOR-CON) 10 MEQ tablet Take 1 tablet (10 mEq total) by mouth daily. 30 tablet 1  . traZODone (DESYREL) 50 MG tablet Take 1 tablet (50 mg total) by mouth at bedtime as needed for sleep. 30 tablet 0    Musculoskeletal: Strength & Muscle Tone: within normal limits Gait & Station: normal Patient leans: N/A  Psychiatric Specialty Exam: Physical Exam Vitals and nursing note reviewed.  Constitutional:      Appearance: He is well-developed.  HENT:     Head: Normocephalic.  Cardiovascular:     Rate and Rhythm: Normal rate.  Pulmonary:     Effort: Pulmonary effort is normal.  Neurological:     Mental Status: He is alert and oriented to person, place, and time.  Psychiatric:        Attention and Perception: Attention and perception normal.  Mood and Affect: Mood and affect normal.        Speech: Speech normal.        Behavior: Behavior normal. Behavior is cooperative.        Thought Content: Thought content normal.        Cognition and Memory: Cognition and memory normal.        Judgment: Judgment normal.     Review of Systems  Constitutional: Negative.   HENT: Negative.   Eyes: Negative.   Respiratory: Negative.   Cardiovascular: Negative.   Gastrointestinal: Negative.   Genitourinary: Negative.    Musculoskeletal: Negative.   Skin: Negative.   Neurological: Negative.   Psychiatric/Behavioral: Negative.     Blood pressure 105/62, pulse 60, temperature 98 F (36.7 C), temperature source Oral, resp. rate 16, SpO2 96 %.There is no height or weight on file to calculate BMI.  General Appearance: Casual  Eye Contact:  Good  Speech:  Clear and Coherent and Normal Rate  Volume:  Normal  Mood:  Euthymic  Affect:  Congruent  Thought Process:  Coherent, Goal Directed and Descriptions of Associations: Intact  Orientation:  Full (Time, Place, and Person)  Thought Content:  WDL and Logical  Suicidal Thoughts:  No  Homicidal Thoughts:  No  Memory:  Immediate;   Good Recent;   Good Remote;   Good  Judgement:  Fair  Insight:  Good  Psychomotor Activity:  Normal  Concentration:  Concentration: Good and Attention Span: Good  Recall:  Good  Fund of Knowledge:  Good  Language:  Good  Akathisia:  No  Handed:  Right  AIMS (if indicated):     Assets:  Communication Skills Desire for Improvement Financial Resources/Insurance Intimacy Leisure Time Physical Health Resilience Social Support  ADL's:  Intact  Cognition:  WNL  Sleep:      1330 after speaking with social work to discuss substance use treatment resources patient reported suicidal ideation with plan to attending physician.  Patient will remain under involuntary commitment and transfer to [facility] behavioral health for continued treatment and stabilization.  Treatment Plan Summary: Plan Patient reviewed with Dr. Lucianne Muss.  Follow-up with outpatient psychiatry, resources provided. Follow-up with substance use treatment resources provided. Patient accepted to [facility] behavioral health.  Disposition: Patient accepted to [facility] behavioral health.  This service was provided via telemedicine using a 2-way, interactive audio and video technology.  Names of all persons participating in this telemedicine  service and their role in this encounter. Name: [name] Role: Patient  Name: Doran Heater Role: FNP  Name: Dr. Effie Shy Role: Emergency department attending  Name: Dr. Lucianne Muss Role: Psychiatrist    Lenard Lance, FNP 06/03/2020 11:38 AM

## 2020-06-03 NOTE — ED Notes (Signed)
[employer] arrived to pick patient up for transport; Patient asked to get a piece of Nicotine gum out of belongings; RN allowed patient to get one piece of gum out of bag but Patient begin rummaging through belongings to get a cigarette ; RN stopped patient and advised that he was not allowed to get any of that; pt insisted that [employer] would allow him to smoke before entering building; RN and [employer] advised patient that he was not able to do that; RN re gathered patient's belongings and secured things; [employer] handcuffed patient for safety to walk to vehicle; pt became aggressive with [employer] and upset he was not allowed to smoke; RN followed officers to vehicle with patient belongings in wheelchair; pt placed self on the ground forcing [employer] to have to pulled patient to vehicle; pt shouting obscenities through the hallways all the way to the squad car. Patient placed in vehicle for transport. Patient did not appear in any acute distress, just upset.-Monique,RN

## 2020-06-04 LAB — POCT URINE DRUG SCREEN - MANUAL ENTRY (I-SCREEN)
POC Amphetamine UR: NOT DETECTED
POC Buprenorphine (BUP): POSITIVE — AB
POC Cocaine UR: POSITIVE — AB
POC Marijuana UR: POSITIVE — AB
POC Methadone UR: NOT DETECTED
POC Methamphetamine UR: NOT DETECTED
POC Morphine: NOT DETECTED
POC Oxazepam (BZO): NOT DETECTED
POC Oxycodone UR: NOT DETECTED
POC Secobarbital (BAR): NOT DETECTED

## 2020-06-04 MED ORDER — LOPERAMIDE HCL 2 MG PO CAPS: 2.0000 mg | ORAL_CAPSULE | ORAL | Status: DC | PRN

## 2020-06-04 MED ORDER — NICOTINE POLACRILEX 2 MG MT GUM: 4.0000 mg | CHEWING_GUM | Freq: Three times a day (TID) | OROMUCOSAL | Status: DC | PRN

## 2020-06-04 MED ORDER — GABAPENTIN 300 MG PO CAPS: 300.0000 mg | ORAL_CAPSULE | Freq: Three times a day (TID) | ORAL | Status: DC

## 2020-06-04 MED ORDER — NAPROXEN 500 MG PO TABS: 500.0000 mg | ORAL_TABLET | Freq: Two times a day (BID) | ORAL | Status: DC | PRN

## 2020-06-04 MED ORDER — DULOXETINE HCL 60 MG PO CPEP
60.0000 mg | ORAL_CAPSULE | Freq: Every day | ORAL | Status: DC
  Administered 2020-06-04: 60 mg via ORAL
  Filled 2020-06-04: qty 1

## 2020-06-04 MED ORDER — ONDANSETRON 4 MG PO TBDP: 4.0000 mg | ORAL_TABLET | Freq: Four times a day (QID) | ORAL | Status: DC | PRN

## 2020-06-04 MED ORDER — DICYCLOMINE HCL 20 MG PO TABS: 20.0000 mg | ORAL_TABLET | Freq: Four times a day (QID) | ORAL | Status: DC | PRN

## 2020-06-04 MED ORDER — METHOCARBAMOL 500 MG PO TABS: 500.0000 mg | ORAL_TABLET | Freq: Three times a day (TID) | ORAL | Status: DC | PRN

## 2020-06-04 MED ORDER — GABAPENTIN 300 MG PO CAPS
300.0000 mg | ORAL_CAPSULE | Freq: Three times a day (TID) | ORAL | Status: DC
  Administered 2020-06-04: 300 mg via ORAL
  Filled 2020-06-04: qty 1

## 2020-06-04 NOTE — Discharge Instructions (Addendum)
Take all medications as prescribed. Keep all follow-up appointments as scheduled.  Do not consume alcohol or use illegal drugs while on prescription medications. Report any adverse effects from your medications to your primary care provider promptly.  In the event of recurrent symptoms or worsening symptoms, call 911, a crisis hotline, or go to the nearest emergency department for evaluation.   

## 2020-06-04 NOTE — ED Notes (Addendum)
Pt admitted to continuous assessment with IVC paperwork stating "Robert [name] w/ PMH of recent psychiatric admissions for SI, MDD, stimulant induced mood disorder presents to ED for SI w/ plan of overdose on fentanyl. Last used last night." Pt A&O x4, calm and cooperative with [facility] staff. Pt denies current SI/HI/AVH. Pt tolerated skin assessment well. Pt ambulated independently to unit. Oriented to unit/staff. No signs of acute distress noted. Will continue to monitor for safety.

## 2020-06-04 NOTE — ED Notes (Addendum)
Pt arrived to [facility] from [facility] via [employer]. Pt A&O x4, visibly angry and agitated. Pt is yelling, cussing, and making threats towards officers. Pt is calm and cooperative with [facility] staff and states he "will be nice to anyone that is nice to me. I just need long-term drug rehab and my suboxone." No signs of acute distress noted. Will continue to monitor for safety.

## 2020-06-04 NOTE — Progress Notes (Signed)
Robert [name] to be D/C'd home per MD order.  Discussed with the patient and all questions fully answered.  VSS, Skin clean, dry and intact without evidence of skin break down, no evidence of skin tears noted.   An After Visit Summary was printed and given to the patient. Patient received medication of flonase. D/c education completed with patient/family including follow up instructions,    [name] 06/04/2020 10:22 AM

## 2020-06-04 NOTE — ED Provider Notes (Addendum)
FBC/OBS ASAP Discharge Summary  Date and Time: 06/04/2020 10:49 AM  Name: Robert [name]  MRN:  [id number]   Discharge Diagnoses:  Final diagnoses:  Substance induced mood disorder (HCC)  Opioid use disorder, severe, dependence (HCC)  Cocaine use disorder (HCC)  Methamphetamine use (HCC)    Evaluation: Robert [name] was observed resting in bed.  He is awake, alert and oriented x3.  Patient is requesting additional outpatient resources for housing and medication management.  Chart reviewed patient was recently discharged from [facility] with similar complaints.  Patient has follow-up appointment scheduled already.  Patient to follow up with [facility] house.  patient was provided with additional outpatient resources for [facility] urgent care facility.   Per admission assessment note:Robert [name] is a 40 year old male with history of polysubstance abuse and substance-induced mood disorder who presents to the behavioral health urgent care under IVC as a direct admit transfer from [facility] emergency department.  Patient presented to [facility] emergency department on 06/02/20 for SI with a plan and substance abuse issues.  Patient was evaluated by TTS on 06/02/20 and initial recommendation was made for inpatient psychiatric treatment at that time.  Patient was reevaluated by psychiatry on 06/03/20 and recommendation was made for patient to be transferred to the [facility] for continuous observation/assessment.   Total Time spent with patient: 15 minutes  Past Psychiatric History:  Past Medical History:  Past Medical History:  Diagnosis Date  . Anxiety   . Arthritis   . Cellulitis 09/2018  . Cellulitis and abscess of upper extremity 02/28/2018  . Cellulitis of hand, right 08/07/2019  . Cellulitis of leg, right   . Cellulitis of right hand 08/08/2019  . Chronic hepatitis C (HCC) 08/07/2019  . Depression   . Hepatitis    via pts mother  . 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 Nicotine dependence, cigarettes, uncomplicated 08/07/2019  . Right arm cellulitis 08/07/2019    Past Surgical History:  Procedure Laterality Date  . GALLBLADDER SURGERY     Family History:  Family History  Problem Relation Age of Onset  . Diabetes Mother   . Diabetes Maternal Grandfather    Family Psychiatric History:  Social History:  Social History   Substance and Sexual Activity  Alcohol Use Not Currently  . Alcohol/week: 0.0 standard drinks     Social History   Substance and Sexual Activity  Drug Use Not Currently  . Types: Cocaine, Heroin, Methamphetamines   Comment: Sober x weeks     Social History   Socioeconomic History  . Marital status: Single    Spouse name: Not on file  . Number of children: Not on file  . Years of education: Not on file  . Highest education level: Not on file  Occupational History  . Not on file  Tobacco Use  . Smoking status: Current Every Day Smoker    Packs/day: 0.50    Types: Cigarettes, E-cigarettes  . Smokeless tobacco: Never Used  . Tobacco comment: .5 PPD and vaping  Vaping Use  . Vaping Use: Some days  Substance and Sexual Activity  . Alcohol use: Not Currently    Alcohol/week: 0.0 standard drinks  . Drug use: Not Currently    Types: Cocaine, Heroin, Methamphetamines    Comment: Sober x weeks   . Sexual activity: Not Currently  Other Topics Concern  . Not on file  Social History Narrative  . Not on file   Social Determinants of  Health   Financial Resource Strain: Not on file  Food Insecurity: Not on file  Transportation Needs: Not on file  Physical Activity: Not on file  Stress: Not on file  Social Connections: Not on file   SDOH:  SDOH Screenings   Alcohol Screen: Not on file  Depression (PHQ2-9): Medium Risk  . PHQ-2 Score: 15  Financial Resource Strain: Not on file  Food Insecurity: Not on file  Housing: Not on file  Physical Activity: Not on file  Social Connections: Not  on file  Stress: Not on file  Tobacco Use: High Risk  . Smoking Tobacco Use: Current Every Day Smoker  . Smokeless Tobacco Use: Never Used  Transportation Needs: Not on file    Has this patient used any form of tobacco in the last 30 days? (Cigarettes, Smokeless Tobacco, Cigars, and/or Pipes) A prescription for an FDA-approved tobacco cessation medication was offered at discharge and the patient refused  Current Medications:  Current Facility-Administered Medications  Medication Dose Route Frequency Provider Last Rate Last Admin  . acetaminophen (TYLENOL) tablet 650 mg  650 mg Oral Q6H PRN Jaclyn Shaggy, PA-C      . alum & mag hydroxide-simeth (MAALOX/MYLANTA) 200-200-20 MG/5ML suspension 30 mL  30 mL Oral Q4H PRN Melbourne Abts W, PA-C      . dicyclomine (BENTYL) tablet 20 mg  20 mg Oral Q6H PRN Jaclyn Shaggy, PA-C      . DULoxetine (CYMBALTA) DR capsule 60 mg  60 mg Oral Daily Jaclyn Shaggy, PA-C   60 mg at 06/04/20 0012  . fluticasone (FLONASE) 50 MCG/ACT nasal spray 2 spray  2 spray Each Nare Daily Jaclyn Shaggy, PA-C   2 spray at 06/04/20 4193  . gabapentin (NEURONTIN) capsule 300 mg  300 mg Oral TID Jaclyn Shaggy, PA-C   300 mg at 06/04/20 7902  . hydrOXYzine (ATARAX/VISTARIL) tablet 25 mg  25 mg Oral TID PRN Jaclyn Shaggy, PA-C   25 mg at 06/04/20 0005  . loperamide (IMODIUM) capsule 2-4 mg  2-4 mg Oral PRN Melbourne Abts W, PA-C      . loratadine (CLARITIN) tablet 10 mg  10 mg Oral Daily Melbourne Abts W, PA-C   10 mg at 06/04/20 4097  . magnesium hydroxide (MILK OF MAGNESIA) suspension 30 mL  30 mL Oral Daily PRN Melbourne Abts W, PA-C      . methocarbamol (ROBAXIN) tablet 500 mg  500 mg Oral Q8H PRN Melbourne Abts W, PA-C      . naproxen (NAPROSYN) tablet 500 mg  500 mg Oral BID PRN Melbourne Abts W, PA-C      . nicotine polacrilex (NICORETTE) gum 4 mg  4 mg Oral Q8H PRN Melbourne Abts W, PA-C      . ondansetron (ZOFRAN-ODT) disintegrating tablet 4 mg  4 mg Oral Q6H PRN Jaclyn Shaggy,  PA-C      . traZODone (DESYREL) tablet 50 mg  50 mg Oral QHS PRN Jaclyn Shaggy, PA-C   50 mg at 06/04/20 0005   Current Outpatient Medications  Medication Sig Dispense Refill  . nicotine polacrilex (NICORETTE) 4 MG gum Take 4 mg by mouth every 8 (eight) hours as needed for smoking cessation.    . ARIPiprazole (ABILIFY) 2 MG tablet Take 1 tablet (2 mg total) by mouth at bedtime. (Patient not taking: Reported on 06/03/2020) 30 tablet 0  . Buprenorphine HCl-Naloxone HCl 8-2 MG FILM Place 1 Film under the tongue in the morning  and at bedtime. (Patient not taking: Reported on 06/03/2020) 28 each 0  . cetirizine (ZYRTEC) 10 MG tablet Take 1 tablet (10 mg total) by mouth daily. (Patient taking differently: Take 10 mg by mouth daily. Patient states he has not been taking, but is supposed to be taking.) 30 tablet 11  . clindamycin (CLEOCIN) 150 MG capsule TAKE 1 CAPSULE (150 MG TOTAL) BY MOUTH FOUR TIMES DAILY FOR 10 DAYS. (Patient not taking: Reported on 06/03/2020) 40 capsule 0  . DULoxetine (CYMBALTA) 30 MG capsule TAKE 1 CAPSULE (30 MG TOTAL) BY MOUTH DAILY. (Patient not taking: Reported on 06/03/2020) 30 capsule 1  . DULoxetine (CYMBALTA) 60 MG capsule Take 1 capsule (60 mg total) by mouth daily. (Patient taking differently: Take 60 mg by mouth daily. Patient states he has not been taking, but is supposed to be taking.) 30 capsule 0  . fluticasone (FLONASE) 50 MCG/ACT nasal spray Place 2 sprays into both nostrils daily. (Patient taking differently: Place 2 sprays into both nostrils daily. Patient states he has not been taking, but is supposed to be taking.) 16 g 6  . gabapentin (NEURONTIN) 300 MG capsule Take 1 capsule (300 mg total) by mouth 3 (three) times daily. (Patient taking differently: Take 300 mg by mouth 3 (three) times daily. Patient states he has not been taking, but is supposed to be taking.) 90 capsule 0  . potassium chloride (KLOR-CON) 10 MEQ tablet Take 1 tablet (10 mEq total) by mouth daily.  (Patient not taking: Reported on 06/03/2020) 30 tablet 1  . traZODone (DESYREL) 50 MG tablet Take 1 tablet (50 mg total) by mouth at bedtime as needed for sleep. (Patient taking differently: Take 50 mg by mouth at bedtime as needed for sleep. Patient states he has not been taking, but is supposed to be taking.) 30 tablet 0    PTA Medications: (Not in a hospital admission)   Musculoskeletal  Strength & Muscle Tone: within normal limits Gait & Station: normal Patient leans: N/A  Psychiatric Specialty Exam  Presentation  General Appearance: Disheveled  Eye Contact:Good  Speech:Clear and Coherent; Normal Rate  Speech Volume:Normal  Handedness:Right   Mood and Affect  Mood:Angry; Depressed; Irritable  Affect:Congruent   Thought Process  Thought Processes:Coherent; Goal Directed; Linear  Descriptions of Associations:Intact  Orientation:Full (Time, Place and Person)  Thought Content:Logical; WDL  Diagnosis of Schizophrenia or Schizoaffective disorder in past: No    Hallucinations:Hallucinations: None  Ideas of Reference:None  Suicidal Thoughts:Suicidal Thoughts: Yes, Passive SI Passive Intent and/or Plan: Without Intent; Without Plan; With Means to Carry Out; With Access to Means  Homicidal Thoughts:Homicidal Thoughts: No   Sensorium  Memory:Immediate Fair; Recent Fair; Remote Fair  Judgment:Poor  Insight:Lacking   Executive Functions  Concentration:Fair  Attention Span:Fair  Recall:Fair  Fund of Knowledge:Fair  Language:Good   Psychomotor Activity  Psychomotor Activity:Psychomotor Activity: Normal   Assets  Assets:Communication Skills; Desire for Improvement; Leisure Time; Physical Health; Resilience   Sleep  Sleep:Sleep: Poor   Nutritional Assessment (For OBS and FBC admissions only) Has the patient had a weight loss or gain of 10 pounds or more in the last 3 months?: No Has the patient had a decrease in food intake/or appetite?: No Does  the patient have dental problems?: No Does the patient have eating habits or behaviors that may be indicators of an eating disorder including binging or inducing vomiting?: No Has the patient recently lost weight without trying?: No Has the patient been eating poorly because of a decreased  appetite?: No Malnutrition Screening Tool Score: 0    Physical Exam  Physical Exam Vitals reviewed.  HENT:     Head: Normocephalic.  Psychiatric:        Attention and Perception: Attention normal.        Mood and Affect: Mood is anxious and depressed.        Speech: Speech normal.        Behavior: Behavior is cooperative.        Thought Content: Thought content normal. Thought content does not include suicidal (passive ideations) ideation.        Cognition and Memory: Cognition normal.        Judgment: Judgment normal.    Review of Systems  Psychiatric/Behavioral: Positive for depression and substance abuse. The patient is nervous/anxious.   All other systems reviewed and are negative.  Blood pressure 115/74, pulse 60, temperature 98.2 F (36.8 C), temperature source Temporal, resp. rate 16, SpO2 99 %. There is no height or weight on file to calculate BMI.  Demographic Factors:  Male and Low socioeconomic status  Loss Factors: Decrease in vocational status and Financial problems/change in socioeconomic status  Historical Factors: Family history of mental illness or substance abuse and Impulsivity  Risk Reduction Factors:   Positive social support and Positive therapeutic relationship  Continued Clinical Symptoms:  Depression:   Hopelessness  Cognitive Features That Contribute To Risk:  Closed-mindedness    Suicide Risk:  Minimal: No identifiable suicidal ideation.  Patients presenting with no risk factors but with morbid ruminations; may be classified as minimal risk based on the severity of the depressive symptoms  Plan Of Care/Follow-up recommendations:  Activity:  as  tolerated Diet:  heart health  Disposition:  NP rescinded involuntary commitment on 06/05/2020 at 10:30 Patient was discharged to [facility] custody  Take all medications as prescribed. Keep all follow-up appointments as scheduled.  Do not consume alcohol or use illegal drugs while on prescription medications. Report any adverse effects from your medications to your primary care provider promptly.  In the event of recurrent symptoms or worsening symptoms, call 911, a crisis hotline, or go to the nearest emergency department for evaluation.   Oneta Rack, NP 06/04/2020, 10:49 AM

## 2020-06-04 NOTE — ED Notes (Signed)
Pt requesting to be discharged. Notified RN and NP.

## 2020-06-04 NOTE — ED Provider Notes (Addendum)
Behavioral Health Admission H&P [name] & OBS)  Date: 06/04/20 Patient Name: Robert [name] MRN: [id number] Chief Complaint:  Chief Complaint  Patient presents with  . IVC  . Suicidal  . Drug Problem      Diagnoses:  Final diagnoses:  Substance induced mood disorder (HCC)  Opioid use disorder, severe, dependence (HCC)  Cocaine use disorder (HCC)  Methamphetamine use (HCC)    Upon patient's arrival to the behavioral health urgent care, notified by [name]) of [employer] (Badge # [phone]) that a warrant is being placed for the patient's arrest for assaulting a police officer when the patient was being transported from [facility] emergency department to the behavioral health urgent care.  [name] states that the patient will be arrested/taken into custody in the future once he is discharged from [name].  Robert [name] Nursing staff to communicate this information to day shift upon shift change.  HPI: Robert [name] is a 40 year old male with history of polysubstance abuse and substance-induced mood disorder who presents to the behavioral health urgent care under IVC as a direct admit transfer from [facility].  Patient presented to [name] emergency department on 06/02/20 for SI with a plan and substance abuse issues.  Patient was evaluated by Robert [name] on 06/02/20 and initial recommendation was made for inpatient psychiatric treatment at that time.  Patient was reevaluated by psychiatry on 06/03/20 and recommendation was made for patient to be transferred to the Robert [name] for continuous observation/assessment.  Patient was petitioned for IVC by EDP on 06/02/20 Robert Core, MD) and first exam for IVC was completed on 06/02/20 by Robert [name] as well.  Affidavit and petition for IVC paperwork states the following:  "Robert [name] w/ PMH of recent psyciatric admissions for SI, MDD, stimulant induced mood disorder presents to ED for SI with plan of overdose on fentanyl.  last  use last night".   Please see patient's IVC paperwork for further details if necessary.    Patient assessed by myself upon his arrival to [name] from the ED. Patient states that he had been staying at [name] for 2 days, but left [name] on Thursday, June 01, 2020 due to him relapsing on substances.  Patient states that on April 7 when he left [name], he paid for a hotel room and purchased fentanyl and crack cocaine.  Patient reports that he last used substances on the evening of June 01, 2020 in which he reports he used about 0.5 g of fentanyl via inhalation route and smoked 0.5 g of crack at that time.  He states that he then went to the emergency department on June 02, 2020 because he was suicidal with a plan to overdose on fentanyl and "was tired of relapsing".  Patient endorses using "at least 1 g" of fentanyl daily via injection or inhalation route for the past 4 years.  Patient also endorses using crack intermittently since the age of 40 years old, but is unable to provide further details regarding frequency/quantity of crack use.  Patient reports that he has been using methamphetamine 0.5 g daily for the past 4 years via injection route, although he reports he has not used meth for the past 2 to 3 weeks.  Patient also states that he began using heroin 4 to 5 years ago and he states that he does not use that often because it is difficult to find him and uses fentanyl more often due to this.  Patient endorses current opiate  withdrawal symptoms including diaphoresis, shakes, irritability, increased appetite, and nausea.  Patient denies history of alcohol abuse or seizures.  Patient endorses multiple periods of sobriety in the past such as 18 month, 13 month, and 68-month periods of sobriety.  He also reports intermittently engaging in NA meetings for the last 6 years.  Patient endorses passive SI on exam with no associated plan.  He denies intentional past suicide attempts, but does admit  that he has had near death experiences in the past due to accidental overdoses on substances. He denies history of self-injurious behavior via cutting or burning himself intentionally.  He denies HI, AVH, paranoia, or delusions.  Per chart review, patient was recently admitted to [facility] and was discharged on Cymbalta 60 mg p.o. daily, gabapentin 300 mg p.o. 3 times daily, and Nicorette gum.  Patient states that he is supposed to be taking these medications as prescribed.  Patient also states that he is supposed to be taking Abilify and trazodone but denies any additional psychotropic medications.  Patient denies taking any additional psychotropic medications.  He states that he is not seeing a psychiatrist or therapist and has never seen a psychiatrist or therapist as an outpatient.  Patient states he is currently homeless. He also reports that his recent stressors include his girlfriend passing away about 3 months ago due to overdose.  Patient denies any access to guns or weapons.  Patient is requesting to go to a long-term substance abuse treatment facility.  He states that he is prescribed Suboxone and is requesting Suboxone treatment.  PDMP review shows history of patient being prescribed buprenorphine and buprenorphine-naloxone by multiple different providers with most recent prescription being buprenorphine-naloxone written and filled on May 17, 2020.  PHQ 2-9:  Flowsheet Row Office Visit from 04/13/2020 in [facility] Office Visit from 10/12/2019 in [facility] Office Visit from 10/07/2019 in [facility]  Thoughts that you would be better off dead, or of hurting yourself in some way Several days Several days Not at all  PHQ-9 Total Score 15 13 8       Flowsheet Row ED from 06/02/2020 in [name] ED from 05/28/2020 in [name] ED from 04/29/2020 in [name]  [name] RISK CATEGORY Error: Q7 should not be populated when Q6 is No No Risk No Risk       Total Time spent with patient: 30 minutes  Musculoskeletal  Strength & Muscle Tone: within normal limits Gait & Station: normal Patient leans: Robert/A  Psychiatric Specialty Exam  Presentation General Appearance: Disheveled  Eye Contact:Good  Speech:Clear and Coherent; Normal Rate  Speech Volume:Normal  Handedness:Right   Mood and Affect  Mood:Angry; Depressed; Irritable  Affect:Congruent   Thought Process  Thought Processes:Coherent; Goal Directed; Linear  Descriptions of Associations:Intact  Orientation:Full (Time, Place and Person)  Thought Content:Logical; WDL  Diagnosis of Schizophrenia or Schizoaffective disorder in past: No   Hallucinations:Hallucinations: None  Ideas of Reference:None  Suicidal Thoughts:Suicidal Thoughts: Yes, Passive SI Passive Intent and/or Plan: Without Intent; Without Plan; With Means to Carry Out; With Access to Means  Homicidal Thoughts:Homicidal Thoughts: No   Sensorium  Memory:Immediate Fair; Recent Fair; Remote Fair  Judgment:Poor  Insight:Lacking   Executive Functions  Concentration:Fair  Attention Span:Fair  Recall:Fair  Fund of Knowledge:Fair  Language:Good   Psychomotor Activity  Psychomotor Activity:Psychomotor Activity: Normal   Assets  Assets:Communication Skills; Desire  for Improvement; Leisure Time; Physical Health; Resilience   Sleep  Sleep:Sleep: Poor   Nutritional Assessment (For OBS and FBC admissions only) Has the patient had a weight loss or gain of 10 pounds or more in the last 3 months?: No Has the patient had a decrease in food intake/or appetite?: No Does the patient have dental problems?: No Does the patient have eating habits or behaviors that may be indicators of an eating disorder including binging or inducing vomiting?: No Has the patient recently  lost weight without trying?: No Has the patient been eating poorly because of a decreased appetite?: No Malnutrition Screening Tool Score: 0    Physical Exam Vitals reviewed.  Constitutional:      General: He is not in acute distress.    Appearance: He is not ill-appearing, toxic-appearing or diaphoretic.  HENT:     Head: Normocephalic and atraumatic.     Right Ear: External ear normal.     Left Ear: External ear normal.  Cardiovascular:     Rate and Rhythm: Normal rate.  Pulmonary:     Effort: Pulmonary effort is normal. No respiratory distress.  Musculoskeletal:        General: Normal range of motion.     Cervical back: Normal range of motion.  Skin:    Comments: Multiple scratch marks/superficial lacerations noted on the back of patient's head and on patient's bilateral arms/forearms.  Neurological:     Mental Status: He is alert and oriented to person, place, and time.     Comments: No tremor noted.  Psychiatric:        Attention and Perception: He does not perceive auditory or visual hallucinations.        Speech: Speech normal.        Behavior: Behavior is agitated. Behavior is not slowed, aggressive, withdrawn, hyperactive or combative. Behavior is cooperative.        Thought Content: Thought content is not paranoid or delusional. Thought content includes suicidal ideation. Thought content does not include homicidal ideation. Thought content does not include suicidal plan.     Comments: Patient stated mood is depressed, angry, and irritable with congruent affect.  Patient initially yelling and verbally aggressive towards law enforcement upon his arrival to [name], but once law enforcement left [facility], patient is now calm, pleasant, and cooperative on exam.  Judgment poor and insight lacking.    Review of Systems  Constitutional: Positive for diaphoresis and malaise/fatigue. Negative for chills, fever and weight loss.       Positive for "shakes"  HENT: Negative for congestion.    Respiratory: Negative for cough and shortness of breath.   Cardiovascular: Negative for chest pain and palpitations.  Gastrointestinal: Positive for nausea. Negative for abdominal pain, constipation, diarrhea and vomiting.  Musculoskeletal: Negative for joint pain and myalgias.  Neurological: Positive for tremors. Negative for dizziness, seizures and headaches.  Psychiatric/Behavioral: Positive for depression, substance abuse and suicidal ideas. Negative for hallucinations and memory loss. The patient is nervous/anxious and has insomnia.     Vitals: Blood pressure (!) 124/91, pulse 70, temperature 98.4 F (36.9 [name]), temperature source Oral, resp. rate 18, SpO2 100 %. There is no height or weight on file to calculate BMI.  Past Psychiatric History: MDD, substance/stimulant induced mood disorder, polysubstance abuse  Is the patient at risk to self? Yes  Has the patient been a risk to self in the past 6 months? Yes .    Has the patient been a risk to self within  the distant past? Yes   Is the patient a risk to others? No   Has the patient been a risk to others in the past 6 months? No   Has the patient been a risk to others within the distant past? No   Past Medical History:  Past Medical History:  Diagnosis Date  . Anxiety   . Arthritis   . Cellulitis 09/2018  . Cellulitis and abscess of upper extremity 02/28/2018  . Cellulitis of hand, right 08/07/2019  . Cellulitis of leg, right   . Cellulitis of right hand 08/08/2019  . Chronic hepatitis [name] (HCC) 08/07/2019  . Depression   . Hepatitis    via pts mother  . 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 Nicotine dependence, cigarettes, uncomplicated 08/07/2019  . Right arm cellulitis 08/07/2019    Past Surgical History:  Procedure Laterality Date  . GALLBLADDER SURGERY      Family History:  Family History  Problem Relation Age of Onset  . Diabetes Mother   . Diabetes Maternal  Grandfather     Social History:  Social History   Socioeconomic History  . Marital status: Single    Spouse name: Not on file  . Number of children: Not on file  . Years of education: Not on file  . Highest education level: Not on file  Occupational History  . Not on file  Tobacco Use  . Smoking status: Current Every Day Smoker    Packs/day: 0.50    Types: Cigarettes, E-cigarettes  . Smokeless tobacco: Never Used  . Tobacco comment: .5 PPD and vaping  Vaping Use  . Vaping Use: Some days  Substance and Sexual Activity  . Alcohol use: Not Currently    Alcohol/week: 0.0 standard drinks  . Drug use: Not Currently    Types: Cocaine, Heroin, Methamphetamines    Comment: Sober x weeks   . Sexual activity: Not Currently  Other Topics Concern  . Not on file  Social History Narrative  . Not on file   Social Determinants of Health   Financial Resource Strain: Not on file  Food Insecurity: Not on file  Transportation Needs: Not on file  Physical Activity: Not on file  Stress: Not on file  Social Connections: Not on file  Intimate Partner Violence: Not on file    SDOH:  SDOH Screenings   Alcohol Screen: Not on file  Depression (PHQ2-9): Medium Risk  . PHQ-2 Score: 15  Financial Resource Strain: Not on file  Food Insecurity: Not on file  Housing: Not on file  Physical Activity: Not on file  Social Connections: Not on file  Stress: Not on file  Tobacco Use: High Risk  . Smoking Tobacco Use: Current Every Day Smoker  . Smokeless Tobacco Use: Never Used  Transportation Needs: Not on file    Last Labs:  Admission on 06/03/2020  Component Date Value Ref Range Status  . POC Amphetamine UR 06/04/2020 None Detected  NONE DETECTED (Cut Off Level 1000 ng/mL) Final  . POC Secobarbital (BAR) 06/04/2020 None Detected  NONE DETECTED (Cut Off Level 300 ng/mL) Final  . POC Buprenorphine (BUP) 06/04/2020 Positive* NONE DETECTED (Cut Off Level 10 ng/mL) Final  . POC Oxazepam  (BZO) 06/04/2020 None Detected  NONE DETECTED (Cut Off Level 300 ng/mL) Final  . POC Cocaine UR 06/04/2020 Positive* NONE DETECTED (Cut Off Level 300 ng/mL) Final  . POC Methamphetamine UR 06/04/2020 None Detected  NONE DETECTED (Cut Off Level 1000  ng/mL) Final  . POC Morphine 06/04/2020 None Detected  NONE DETECTED (Cut Off Level 300 ng/mL) Final  . POC Oxycodone UR 06/04/2020 None Detected  NONE DETECTED (Cut Off Level 100 ng/mL) Final  . POC Methadone UR 06/04/2020 None Detected  NONE DETECTED (Cut Off Level 300 ng/mL) Final  . POC Marijuana UR 06/04/2020 Positive* NONE DETECTED (Cut Off Level 50 ng/mL) Final  Admission on 06/02/2020, Discharged on 06/03/2020  Component Date Value Ref Range Status  . SARS Coronavirus 2 by RT PCR 06/02/2020 NEGATIVE  NEGATIVE Final   Comment: (NOTE) SARS-CoV-2 target nucleic acids are NOT DETECTED.  The SARS-CoV-2 RNA is generally detectable in upper respiratory specimens during the acute phase of infection. The lowest concentration of SARS-CoV-2 viral copies this assay can detect is 138 copies/mL. A negative result does not preclude SARS-Cov-2 infection and should not be used as the sole basis for treatment or other patient management decisions. A negative result may occur with  improper specimen collection/handling, submission of specimen other than nasopharyngeal swab, presence of viral mutation(s) within the areas targeted by this assay, and inadequate number of viral copies(<138 copies/mL). A negative result must be combined with clinical observations, patient history, and epidemiological information. The expected result is Negative.  Fact Sheet for Patients:  BloggerCourse.com  Fact Sheet for Healthcare Providers:  SeriousBroker.it  This test is no                          t yet approved or cleared by the [place] FDA and  has been authorized for detection and/or diagnosis of SARS-CoV-2  by FDA under an Emergency Use Authorization (EUA). This EUA will remain  in effect (meaning this test can be used) for the duration of the COVID-19 declaration under Section 564(b)(1) of the Act, 21 U.S.[name].section 360bbb-3(b)(1), unless the authorization is terminated  or revoked sooner.      . Influenza A by PCR 06/02/2020 NEGATIVE  NEGATIVE Final  . Influenza B by PCR 06/02/2020 NEGATIVE  NEGATIVE Final   Comment: (NOTE) The Xpert Xpress SARS-CoV-2/FLU/RSV plus assay is intended as an aid in the diagnosis of influenza from Nasopharyngeal swab specimens and should not be used as a sole basis for treatment. Nasal washings and aspirates are unacceptable for Xpert Xpress SARS-CoV-2/FLU/RSV testing.  Fact Sheet for Patients: BloggerCourse.com  Fact Sheet for Healthcare Providers: SeriousBroker.it  This test is not yet approved or cleared by the [place] FDA and has been authorized for detection and/or diagnosis of SARS-CoV-2 by FDA under an Emergency Use Authorization (EUA). This EUA will remain in effect (meaning this test can be used) for the duration of the COVID-19 declaration under Section 564(b)(1) of the Act, 21 U.S.[name]. section 360bbb-3(b)(1), unless the authorization is terminated or revoked.  Performed at [name], [address] [place], [place] [address]   . Sodium 06/02/2020 134* 135 - 145 mmol/L Final  . Potassium 06/02/2020 4.5  3.5 - 5.1 mmol/L Final  . Chloride 06/02/2020 100  98 - 111 mmol/L Final  . CO2 06/02/2020 26  22 - 32 mmol/L Final  . Glucose, Bld 06/02/2020 68* 70 - 99 mg/dL Final   Glucose reference range applies only to samples taken after fasting for at least 8 hours.  . BUN 06/02/2020 12  6 - 20 mg/dL Final  . Creatinine, Ser 06/02/2020 0.70  0.61 - 1.24 mg/dL Final  . Calcium 60/45/4098 9.4  8.9 - 10.3 mg/dL Final  .  Total Protein 06/02/2020 7.6  6.5 - 8.1 g/dL Final  . Albumin  70/35/0093 3.9  3.5 - 5.0 g/dL Final  . AST 81/82/9937 138* 15 - 41 U/L Final  . ALT 06/02/2020 180* 0 - 44 U/L Final  . Alkaline Phosphatase 06/02/2020 84  38 - 126 U/L Final  . Total Bilirubin 06/02/2020 0.3  0.3 - 1.2 mg/dL Final  . GFR, Estimated 06/02/2020 >60  >60 mL/min Final   Comment: (NOTE) Calculated using the CKD-EPI Creatinine Equation (2021)   . Anion gap 06/02/2020 8  5 - 15 Final   Performed at [name], [address] [place], [place] [address]  . Alcohol, Ethyl (B) 06/02/2020 <10  <10 mg/dL Final   Comment: (NOTE) Lowest detectable limit for serum alcohol is 10 mg/dL.  For medical purposes only. Performed at [name], [address] [place], [place] [address]   . WBC 06/02/2020 8.2  4.0 - 10.5 K/uL Final   Comment: REPEATED TO VERIFY WHITE COUNT CONFIRMED ON SMEAR   . RBC 06/02/2020 4.78  4.22 - 5.81 MIL/uL Final  . Hemoglobin 06/02/2020 14.6  13.0 - 17.0 g/dL Final  . HCT 01/75/1025 45.1  39.0 - 52.0 % Final  . MCV 06/02/2020 94.4  80.0 - 100.0 fL Final  . MCH 06/02/2020 30.5  26.0 - 34.0 pg Final  . MCHC 06/02/2020 32.4  30.0 - 36.0 g/dL Final  . RDW 85/27/7824 12.7  11.5 - 15.5 % Final  . Platelets 06/02/2020 120* 150 - 400 K/uL Final   Comment: REPEATED TO VERIFY PLATELET COUNT CONFIRMED BY SMEAR   . nRBC 06/02/2020 0.0  0.0 - 0.2 % Final  . Neutrophils Relative % 06/02/2020 55  % Final  . Neutro Abs 06/02/2020 4.6  1.7 - 7.7 K/uL Final  . Lymphocytes Relative 06/02/2020 29  % Final  . Lymphs Abs 06/02/2020 2.4  0.7 - 4.0 K/uL Final  . Monocytes Relative 06/02/2020 10  % Final  . Monocytes Absolute 06/02/2020 0.8  0.1 - 1.0 K/uL Final  . Eosinophils Relative 06/02/2020 4  % Final  . Eosinophils Absolute 06/02/2020 0.3  0.0 - 0.5 K/uL Final  . Basophils Relative 06/02/2020 1  % Final  . Basophils Absolute 06/02/2020 0.1  0.0 - 0.1 K/uL Final  . Immature Granulocytes 06/02/2020 1  % Final  . Abs Immature Granulocytes 06/02/2020  0.04  0.00 - 0.07 K/uL Final   Performed at [facility], [address] [place], [place] [address]  . Salicylate Lvl 06/02/2020 <7.0* 7.0 - 30.0 mg/dL Final   Performed at [facility], [address] [place], [place] [address]  . Acetaminophen (Tylenol), Serum 06/02/2020 <10* 10 - 30 ug/mL Final   Comment: (NOTE) Therapeutic concentrations vary significantly. A range of 10-30 ug/mL  may be an effective concentration for many patients. However, some  are best treated at concentrations outside of this range. Acetaminophen concentrations >150 ug/mL at 4 hours after ingestion  and >50 ug/mL at 12 hours after ingestion are often associated with  toxic reactions.  Performed at [name], [address], [place], [place] [address]   . Glucose-Capillary 06/02/2020 102* 70 - 99 mg/dL Final   Glucose reference range applies only to samples taken after fasting for at least 8 hours.  Admission on 05/28/2020, Discharged on 05/28/2020  Component Date Value Ref Range Status  . Sodium 05/28/2020 139  135 - 145 mmol/L Final  .  Potassium 05/28/2020 3.5  3.5 - 5.1 mmol/L Final  . Chloride 05/28/2020 102  98 - 111 mmol/L Final  . CO2 05/28/2020 28  22 - 32 mmol/L Final  . Glucose, Bld 05/28/2020 124* 70 - 99 mg/dL Final   Glucose reference range applies only to samples taken after fasting for at least 8 hours.  . BUN 05/28/2020 8  6 - 20 mg/dL Final  . Creatinine, Ser 05/28/2020 0.77  0.61 - 1.24 mg/dL Final  . Calcium 16/11/9602 9.2  8.9 - 10.3 mg/dL Final  . GFR, Estimated 05/28/2020 >60  >60 mL/min Final   Comment: (NOTE) Calculated using the CKD-EPI Creatinine Equation (2021)   . Anion gap 05/28/2020 9  5 - 15 Final   Performed at [name], [address] [place], [place] [address]  . WBC 05/28/2020 8.6  4.0 - 10.5 K/uL Final  . RBC 05/28/2020 4.89  4.22 - 5.81 MIL/uL Final  . Hemoglobin 05/28/2020 15.0  13.0 - 17.0 g/dL Final  . HCT 11/91/4782  45.8  39.0 - 52.0 % Final  . MCV 05/28/2020 93.7  80.0 - 100.0 fL Final  . MCH 05/28/2020 30.7  26.0 - 34.0 pg Final  . MCHC 05/28/2020 32.8  30.0 - 36.0 g/dL Final  . RDW 95/62/1308 12.6  11.5 - 15.5 % Final  . Platelets 05/28/2020 249  150 - 400 K/uL Final  . nRBC 05/28/2020 0.0  0.0 - 0.2 % Final   Performed at [facility], [address], [place], [place] [address]  . Troponin I (High Sensitivity) 05/28/2020 3  <18 ng/L Final   Comment: (NOTE) Elevated high sensitivity troponin I (hsTnI) values and significant  changes across serial measurements may suggest ACS but many other  chronic and acute conditions are known to elevate hsTnI results.  Refer to the "Links" section for chest pain algorithms and additional  guidance. Performed at [facility], [address] [place], [place] [address]   Admission on 04/29/2020, Discharged on 04/30/2020  Component Date Value Ref Range Status  . WBC 04/29/2020 11.8* 4.0 - 10.5 K/uL Final  . RBC 04/29/2020 5.27  4.22 - 5.81 MIL/uL Final  . Hemoglobin 04/29/2020 16.3  13.0 - 17.0 g/dL Final  . HCT 52/84/1324 48.8  39.0 - 52.0 % Final  . MCV 04/29/2020 92.6  80.0 - 100.0 fL Final  . MCH 04/29/2020 30.9  26.0 - 34.0 pg Final  . MCHC 04/29/2020 33.4  30.0 - 36.0 g/dL Final  . RDW 40/11/2723 13.1  11.5 - 15.5 % Final  . Platelets 04/29/2020 315  150 - 400 K/uL Final  . nRBC 04/29/2020 0.0  0.0 - 0.2 % Final  . Neutrophils Relative % 04/29/2020 81  % Final  . Neutro Abs 04/29/2020 9.6* 1.7 - 7.7 K/uL Final  . Lymphocytes Relative 04/29/2020 10  % Final  . Lymphs Abs 04/29/2020 1.1  0.7 - 4.0 K/uL Final  . Monocytes Relative 04/29/2020 8  % Final  . Monocytes Absolute 04/29/2020 1.0  0.1 - 1.0 K/uL Final  . Eosinophils Relative 04/29/2020 0  % Final  . Eosinophils Absolute 04/29/2020 0.0  0.0 - 0.5 K/uL Final  . Basophils Relative 04/29/2020 1  % Final  . Basophils Absolute 04/29/2020 0.1  0.0 - 0.1 K/uL  Final  . Immature Granulocytes 04/29/2020 0  % Final  . Abs Immature Granulocytes 04/29/2020 0.03  0.00 - 0.07 K/uL Final   Performed at [name], [address] [place], [place] [address]  . Sodium 04/29/2020 136  135 - 145 mmol/L Final  . Potassium 04/29/2020 3.7  3.5 - 5.1 mmol/L Final  . Chloride 04/29/2020 99  98 - 111 mmol/L Final  . CO2 04/29/2020 23  22 - 32 mmol/L Final  . Glucose, Bld 04/29/2020 114* 70 - 99 mg/dL Final   Glucose reference range applies only to samples taken after fasting for at least 8 hours.  . BUN 04/29/2020 20  6 - 20 mg/dL Final  . Creatinine, Ser 04/29/2020 1.06  0.61 - 1.24 mg/dL Final  . Calcium 60/45/4098 10.1  8.9 - 10.3 mg/dL Final  . Total Protein 04/29/2020 9.1* 6.5 - 8.1 g/dL Final  . Albumin 11/91/4782 4.8  3.5 - 5.0 g/dL Final  . AST 95/62/1308 209* 15 - 41 U/L Final  . ALT 04/29/2020 348* 0 - 44 U/L Final  . Alkaline Phosphatase 04/29/2020 97  38 - 126 U/L Final  . Total Bilirubin 04/29/2020 1.3* 0.3 - 1.2 mg/dL Final  . GFR, Estimated 04/29/2020 >60  >60 mL/min Final   Comment: (NOTE) Calculated using the CKD-EPI Creatinine Equation (2021)   . Anion gap 04/29/2020 14  5 - 15 Final   Performed at [facility], [address], [place], [place] [address]  . Lactic Acid, Venous 04/29/2020 1.8  0.5 - 1.9 mmol/L Final   Performed at [name], [address], [place], [place] [address]  . Specimen Description 04/29/2020    Final                   Value:BLOOD RIGHT ANTECUBITAL Performed at [facility], [address] [place], [place] [address]   . Special Requests 04/29/2020    Final                   Value:BOTTLES DRAWN AEROBIC AND ANAEROBIC Blood Culture adequate volume Performed at [facility], [address], [place], [place] [address]   . Culture 04/29/2020    Final                   Value:NO GROWTH 5 DAYS Performed at [name], [address] [place], [place] [address]   . Report Status 04/29/2020 05/04/2020 FINAL   Final  . Troponin I (High Sensitivity) 04/29/2020 6  <18 ng/L Final   Comment: (NOTE) Elevated high sensitivity troponin I (hsTnI) values and significant  changes across serial measurements may suggest ACS but many other  chronic and acute conditions are known to elevate hsTnI results.  Refer to the "Links" section for chest pain algorithms and additional  guidance. Performed at [facility], [address], [place], [place] [address]   . B Natriuretic Peptide 04/29/2020 24.6  0.0 - 100.0 pg/mL Final   Performed at [name], [address] [place], [place] [address]  . SARS Coronavirus 2 by RT PCR 04/29/2020 NEGATIVE  NEGATIVE Final   Comment: (NOTE) SARS-CoV-2 target nucleic acids are NOT DETECTED.  The SARS-CoV-2 RNA is generally detectable in upper respiratory specimens during the acute phase of infection. The lowest concentration of SARS-CoV-2 viral copies this assay can detect is 138 copies/mL. A negative result does not preclude SARS-Cov-2 infection and should not be used as the sole basis for treatment or other patient management decisions. A negative result may occur with  improper specimen collection/handling, submission of specimen other  than nasopharyngeal swab, presence of viral mutation(s) within the areas targeted by this assay, and inadequate number of viral copies(<138 copies/mL). A negative result must be combined with clinical observations, patient history, and epidemiological information. The expected result is Negative.  Fact Sheet for Patients:  BloggerCourse.com  Fact Sheet for Healthcare Providers:  SeriousBroker.it  This test is no                          t yet approved or cleared by the [place] FDA and  has been authorized for detection and/or diagnosis of  SARS-CoV-2 by FDA under an Emergency Use Authorization (EUA). This EUA will remain  in effect (meaning this test can be used) for the duration of the COVID-19 declaration under Section 564(b)(1) of the Act, 21 [name]section 360bbb-3(b)(1), unless the authorization is terminated  or revoked sooner.      . Influenza A by PCR 04/29/2020 NEGATIVE  NEGATIVE Final  . Influenza B by PCR 04/29/2020 NEGATIVE  NEGATIVE Final   Comment: (NOTE) The Xpert Xpress SARS-CoV-2/FLU/RSV plus assay is intended as an aid in the diagnosis of influenza from Nasopharyngeal swab specimens and should not be used as a sole basis for treatment. Nasal washings and aspirates are unacceptable for Xpert Xpress SARS-CoV-2/FLU/RSV testing.  Fact Sheet for Patients: BloggerCourse.com  Fact Sheet for Healthcare Providers: SeriousBroker.it  This test is not yet approved or cleared by the [place] FDA and has been authorized for detection and/or diagnosis of SARS-CoV-2 by FDA under an Emergency Use Authorization (EUA). This EUA will remain in effect (meaning this test can be used) for the duration of the COVID-19 declaration under Section 564(b)(1) of the Act, 21 [name] section 360bbb-3(b)(1), unless the authorization is terminated or revoked.  Performed at [facility], [address], [place], [place] [address]   . Opiates 04/29/2020 NONE DETECTED  NONE DETECTED Final  . Cocaine 04/29/2020 POSITIVE* NONE DETECTED Final  . Benzodiazepines 04/29/2020 NONE DETECTED  NONE DETECTED Final  . Amphetamines 04/29/2020 POSITIVE* NONE DETECTED Final  . Tetrahydrocannabinol 04/29/2020 POSITIVE* NONE DETECTED Final  . Barbiturates 04/29/2020 NONE DETECTED  NONE DETECTED Final   Comment: (NOTE) DRUG SCREEN FOR MEDICAL PURPOSES ONLY.  IF CONFIRMATION IS NEEDED FOR ANY PURPOSE, NOTIFY LAB WITHIN 5 DAYS.  LOWEST DETECTABLE LIMITS FOR URINE DRUG  SCREEN Drug Class                     Cutoff (ng/mL) Amphetamine and metabolites    1000 Barbiturate and metabolites    200 Benzodiazepine                 200 Tricyclics and metabolites     300 Opiates and metabolites        300 Cocaine and metabolites        300 THC                            50 Performed at [name], [address], [place], [place] [address]   . Specimen Description 04/29/2020    Final                   Value:BLOOD RIGHT HAND Performed at [facility], [address], [place], [place] [address]   . Special Requests 04/29/2020    Final  Value:BOTTLES DRAWN AEROBIC AND ANAEROBIC Blood Culture results may not be optimal due to an excessive volume of blood received in culture bottles Performed at [name], [address], [place], [place] [address]   . Culture 04/29/2020    Final                   Value:NO GROWTH 5 DAYS Performed at [facility], [address] [place], [place] [address]   . Report Status 04/29/2020 05/04/2020 FINAL   Final  Office Visit on 04/25/2020  Component Date Value Ref Range Status  . SARS Coronavirus 2 Ag 04/26/2020 Negative  Negative Final  . Influenza A, POC 04/26/2020 Negative  Negative Final  . Influenza B, POC 04/26/2020 Negative  Negative Final  Admission on 03/11/2020, Discharged on 03/11/2020  Component Date Value Ref Range Status  . Sodium 03/11/2020 138  135 - 145 mmol/L Final  . Potassium 03/11/2020 3.4* 3.5 - 5.1 mmol/L Final  . Chloride 03/11/2020 98  98 - 111 mmol/L Final  . CO2 03/11/2020 29  22 - 32 mmol/L Final  . Glucose, Bld 03/11/2020 150* 70 - 99 mg/dL Final   Glucose reference range applies only to samples taken after fasting for at least 8 hours.  . BUN 03/11/2020 6  6 - 20 mg/dL Final  . Creatinine, Ser 03/11/2020 0.59* 0.61 - 1.24 mg/dL Final  . Calcium 09/81/1914 8.8* 8.9 - 10.3 mg/dL Final  . Total Protein 03/11/2020 7.3   6.5 - 8.1 g/dL Final  . Albumin 78/29/5621 3.4* 3.5 - 5.0 g/dL Final  . AST 30/86/5784 80* 15 - 41 U/L Final  . ALT 03/11/2020 127* 0 - 44 U/L Final  . Alkaline Phosphatase 03/11/2020 88  38 - 126 U/L Final  . Total Bilirubin 03/11/2020 0.6  0.3 - 1.2 mg/dL Final  . GFR, Estimated 03/11/2020 >60  >60 mL/min Final   Comment: (NOTE) Calculated using the CKD-EPI Creatinine Equation (2021)   . Anion gap 03/11/2020 11  5 - 15 Final   Performed at [facility], [address] [place] [place], [place] [address]  . Alcohol, Ethyl (B) 03/11/2020 <10  <10 mg/dL Final   Comment: (NOTE) Lowest detectable limit for serum alcohol is 10 mg/dL.  For medical purposes only. Performed at [name], [address] [place], [place] [address]   . Salicylate Lvl 03/11/2020 <7.0* 7.0 - 30.0 mg/dL Final   Performed at [name], [address] [place] [place], [place] [address]  . Acetaminophen (Tylenol), Serum 03/11/2020 <10* 10 - 30 ug/mL Final   Comment: (NOTE) Therapeutic concentrations vary significantly. A range of 10-30 ug/mL  may be an effective concentration for many patients. However, some  are best treated at concentrations outside of this range. Acetaminophen concentrations >150 ug/mL at 4 hours after ingestion  and >50 ug/mL at 12 hours after ingestion are often associated with  toxic reactions.  Performed at [facility], [address], [place], [place] [address]   . WBC 03/11/2020 5.1  4.0 - 10.5 K/uL Final  . RBC 03/11/2020 4.02* 4.22 - 5.81 MIL/uL Final  . Hemoglobin 03/11/2020 12.6* 13.0 - 17.0 g/dL Final  . HCT 53/66/4403 37.2* 39.0 - 52.0 % Final  . MCV 03/11/2020 92.5  80.0 - 100.0 fL Final  . MCH 03/11/2020 31.3  26.0 - 34.0 pg Final  . MCHC 03/11/2020 33.9  30.0 - 36.0 g/dL Final  . RDW 47/42/5956 12.0  11.5 - 15.5 %  Final  . Platelets 03/11/2020 234  150 - 400 K/uL Final  . nRBC 03/11/2020 0.0  0.0 - 0.2 % Final   Performed at [name], [address] [place] [place], [place] [address]  . Tricyclic, Ur Screen 03/11/2020 NONE DETECTED  NONE DETECTED Final  . Amphetamines, Ur Screen 03/11/2020 POSITIVE* NONE DETECTED Final  . MDMA (Ecstasy)Ur Screen 03/11/2020 NONE DETECTED  NONE DETECTED Final  . Cocaine Metabolite,Ur [place] 03/11/2020 POSITIVE* NONE DETECTED Final  . Opiate, Ur Screen 03/11/2020 NONE DETECTED  NONE DETECTED Final  . Phencyclidine (PCP) Ur S 03/11/2020 NONE DETECTED  NONE DETECTED Final  . Cannabinoid 50 Ng, Ur [place] 03/11/2020 POSITIVE* NONE DETECTED Final  . Barbiturates, Ur Screen 03/11/2020 NONE DETECTED  NONE DETECTED Final  . Benzodiazepine, Ur Scrn 03/11/2020 NONE DETECTED  NONE DETECTED Final  . Methadone Scn, Ur 03/11/2020 NONE DETECTED  NONE DETECTED Final   Comment: (NOTE) Tricyclics + metabolites, urine    Cutoff 1000 ng/mL Amphetamines + metabolites, urine  Cutoff 1000 ng/mL MDMA (Ecstasy), urine              Cutoff 500 ng/mL Cocaine Metabolite, urine          Cutoff 300 ng/mL Opiate + metabolites, urine        Cutoff 300 ng/mL Phencyclidine (PCP), urine         Cutoff 25 ng/mL Cannabinoid, urine                 Cutoff 50 ng/mL Barbiturates + metabolites, urine  Cutoff 200 ng/mL Benzodiazepine, urine              Cutoff 200 ng/mL Methadone, urine                   Cutoff 300 ng/mL  The urine drug screen provides only a preliminary, unconfirmed analytical test result and should not be used for non-medical purposes. Clinical consideration and professional judgment should be applied to any positive drug screen result due to possible interfering substances. A more specific alternate chemical method must be used in order to obtain a confirmed analytical result. Gas chromatography / mass spectrometry (GC/MS) is the preferred confirm                          atory method. Performed at [facility], [address] [place], [place] [address]   . SARS Coronavirus 2 by RT PCR  03/11/2020 NEGATIVE  NEGATIVE Final   Comment: (NOTE) SARS-CoV-2 target nucleic acids are NOT DETECTED.  The SARS-CoV-2 RNA is generally detectable in upper respiratory specimens during the acute phase of infection. The lowest concentration of SARS-CoV-2 viral copies this assay can detect is 138 copies/mL. A negative result does not preclude SARS-Cov-2 infection and should not be used as the sole basis for treatment or other patient management decisions. A negative result may occur with  improper specimen collection/handling, submission of specimen other than nasopharyngeal swab, presence of viral mutation(s) within the areas targeted by this assay, and inadequate number of viral copies(<138 copies/mL). A negative result must be combined with clinical observations, patient history, and epidemiological information. The expected result is Negative.  Fact Sheet for Patients:  BloggerCourse.com  Fact Sheet for Healthcare Providers:  SeriousBroker.it  This test is no                          t yet approved or cleared  by the [place] and  has been authorized for detection and/or diagnosis of SARS-CoV-2 by FDA under an Emergency Use Authorization (EUA). This EUA will remain  in effect (meaning this test can be used) for the duration of the COVID-19 declaration under Section 564(b)(1) of the Act, 21 [name]section 360bbb-3(b)(1), unless the authorization is terminated  or revoked sooner.      . Influenza A by PCR 03/11/2020 NEGATIVE  NEGATIVE Final  . Influenza B by PCR 03/11/2020 NEGATIVE  NEGATIVE Final   Comment: (NOTE) The Xpert Xpress SARS-CoV-2/FLU/RSV plus assay is intended as an aid in the diagnosis of influenza from Nasopharyngeal swab specimens and should not be used as a sole basis for treatment. Nasal washings and aspirates are unacceptable for Xpert Xpress SARS-CoV-2/FLU/RSV testing.  Fact Sheet for  Patients: BloggerCourse.com  Fact Sheet for Healthcare Providers: SeriousBroker.it  This test is not yet approved or cleared by the [place] FDA and has been authorized for detection and/or diagnosis of SARS-CoV-2 by FDA under an Emergency Use Authorization (EUA). This EUA will remain in effect (meaning this test can be used) for the duration of the COVID-19 declaration under Section 564(b)(1) of the Act, 21 [name] section 360bbb-3(b)(1), unless the authorization is terminated or revoked.  Performed at [facility], [address] [place], [place] [address]   Admission on 03/09/2020, Discharged on 03/10/2020  Component Date Value Ref Range Status  . Lactic Acid, Venous 03/10/2020 0.9  0.5 - 1.9 mmol/L Final   Performed at [facility], [address], [place], [place] [address]  . Lactic Acid, Venous 03/10/2020 0.8  0.5 - 1.9 mmol/L Final   Performed at [facility], [address] [place], [place] [address]  . Sodium 03/10/2020 135  135 - 145 mmol/L Final  . Potassium 03/10/2020 3.7  3.5 - 5.1 mmol/L Final  . Chloride 03/10/2020 99  98 - 111 mmol/L Final  . CO2 03/10/2020 24  22 - 32 mmol/L Final  . Glucose, Bld 03/10/2020 115* 70 - 99 mg/dL Final   Glucose reference range applies only to samples taken after fasting for at least 8 hours.  . BUN 03/10/2020 8  6 - 20 mg/dL Final  . Creatinine, Ser 03/10/2020 0.70  0.61 - 1.24 mg/dL Final  . Calcium 21/30/8657 9.2  8.9 - 10.3 mg/dL Final  . Total Protein 03/10/2020 7.6  6.5 - 8.1 g/dL Final  . Albumin 84/69/6295 3.5  3.5 - 5.0 g/dL Final  . AST 28/41/3244 89* 15 - 41 U/L Final  . ALT 03/10/2020 140* 0 - 44 U/L Final  . Alkaline Phosphatase 03/10/2020 92  38 - 126 U/L Final  . Total Bilirubin 03/10/2020 0.7  0.3 - 1.2 mg/dL Final  . GFR, Estimated 03/10/2020 >60  >60 mL/min Final   Comment: (NOTE) Calculated using the CKD-EPI Creatinine Equation  (2021)   . Anion gap 03/10/2020 12  5 - 15 Final   Performed at [name], [address], [place], [place] [address]  . WBC 03/10/2020 6.9  4.0 - 10.5 K/uL Final  . RBC 03/10/2020 4.21* 4.22 - 5.81 MIL/uL Final  . Hemoglobin 03/10/2020 13.5  13.0 - 17.0 g/dL Final  . HCT 25/36/6440 38.4* 39.0 - 52.0 % Final  . MCV 03/10/2020 91.2  80.0 - 100.0 fL Final  . MCH 03/10/2020 32.1  26.0 - 34.0 pg Final  . MCHC 03/10/2020 35.2  30.0 - 36.0 g/dL Final  . RDW  03/10/2020 12.3  11.5 - 15.5 % Final  . Platelets 03/10/2020 266  150 - 400 K/uL Final  . nRBC 03/10/2020 0.0  0.0 - 0.2 % Final  . Neutrophils Relative % 03/10/2020 48  % Final  . Neutro Abs 03/10/2020 3.3  1.7 - 7.7 K/uL Final  . Lymphocytes Relative 03/10/2020 31  % Final  . Lymphs Abs 03/10/2020 2.2  0.7 - 4.0 K/uL Final  . Monocytes Relative 03/10/2020 12  % Final  . Monocytes Absolute 03/10/2020 0.8  0.1 - 1.0 K/uL Final  . Eosinophils Relative 03/10/2020 8  % Final  . Eosinophils Absolute 03/10/2020 0.5  0.0 - 0.5 K/uL Final  . Basophils Relative 03/10/2020 1  % Final  . Basophils Absolute 03/10/2020 0.1  0.0 - 0.1 K/uL Final  . Immature Granulocytes 03/10/2020 0  % Final  . Abs Immature Granulocytes 03/10/2020 0.02  0.00 - 0.07 K/uL Final   Performed at [name], [address], [place], [place] [address]  . Specimen Description 03/10/2020 BLOOD RIGHT ARM   Final  . Special Requests 03/10/2020 BOTTLES DRAWN AEROBIC AND ANAEROBIC Blood Culture adequate volume   Final  . Culture 03/10/2020    Final                   Value:NO GROWTH 5 DAYS Performed at [name], [address], [place], [place] [address]   . Report Status 03/10/2020 03/15/2020 FINAL   Final  . Specimen Description 03/10/2020 BLOOD LEFT ARM   Final  . Special Requests 03/10/2020 AEROBIC BOTTLE ONLY Blood Culture adequate volume   Final  . Culture 03/10/2020    Final                   Value:NO GROWTH 5 DAYS Performed at [name], [address] [place], [place] [address]   . Report Status 03/10/2020 03/15/2020 FINAL   Final  . SARS Coronavirus 2 03/10/2020 NEGATIVE  NEGATIVE Final   Comment: (NOTE) SARS-CoV-2 target nucleic acids are NOT DETECTED.  The SARS-CoV-2 RNA is generally detectable in upper and lower respiratory specimens during the acute phase of infection. Negative results do not preclude SARS-CoV-2 infection, do not rule out co-infections with other pathogens, and should not be used as the sole basis for treatment or other patient management decisions. Negative results must be combined with clinical observations, patient history, and epidemiological information. The expected result is Negative.  Fact Sheet for Patients: HairSlick.no  Fact Sheet for Healthcare Providers: quierodirigir.com  This test is not yet approved or cleared by the [place] FDA and  has been authorized for detection and/or diagnosis of SARS-CoV-2 by FDA under an Emergency Use Authorization (EUA). This EUA will remain  in effect (meaning this test can be used) for the duration of the COVID-19 declaration under Se                          ction 564(b)(1) of the Act, 21 [name] section 360bbb-3(b)(1), unless the authorization is terminated or revoked sooner.  Performed at [facility], [name] [address], [place], [place] [address]   . Opiates 03/10/2020 NONE DETECTED  NONE DETECTED Final  . Cocaine 03/10/2020 POSITIVE* NONE DETECTED Final  . Benzodiazepines 03/10/2020 NONE DETECTED  NONE DETECTED Final  . Amphetamines 03/10/2020 POSITIVE* NONE DETECTED Final  . Tetrahydrocannabinol 03/10/2020 POSITIVE* NONE DETECTED Final  . Barbiturates 03/10/2020 NONE DETECTED  NONE DETECTED Final   Comment: (NOTE) DRUG SCREEN FOR MEDICAL PURPOSES ONLY.  IF CONFIRMATION IS NEEDED FOR ANY PURPOSE, NOTIFY LAB WITHIN 5 DAYS.  LOWEST DETECTABLE LIMITS FOR URINE DRUG  SCREEN Drug Class                     Cutoff (ng/mL) Amphetamine and metabolites    1000 Barbiturate and metabolites    200 Benzodiazepine                 200 Tricyclics and metabolites     300 Opiates and metabolites        300 Cocaine and metabolites        300 THC                            50 Performed at [name], [address], [place], [place] [address]   . Alcohol, Ethyl (B) 03/10/2020 <10  <10 mg/dL Final   Comment: (NOTE) Lowest detectable limit for serum alcohol is 10 mg/dL.  For medical purposes only. Performed at [facility], [address] [place], [place] [address]   . Sodium 03/10/2020 136  135 - 145 mmol/L Final  . Potassium 03/10/2020 3.5  3.5 - 5.1 mmol/L Final  . Chloride 03/10/2020 100  98 - 111 mmol/L Final  . CO2 03/10/2020 27  22 - 32 mmol/L Final  . Glucose, Bld 03/10/2020 106* 70 - 99 mg/dL Final   Glucose reference range applies only to samples taken after fasting for at least 8 hours.  . BUN 03/10/2020 6  6 - 20 mg/dL Final  . Creatinine, Ser 03/10/2020 0.69  0.61 - 1.24 mg/dL Final  . Calcium 09/81/1914 8.9  8.9 - 10.3 mg/dL Final  . Total Protein 03/10/2020 6.9  6.5 - 8.1 g/dL Final  . Albumin 78/29/5621 3.2* 3.5 - 5.0 g/dL Final  . AST 30/86/5784 81* 15 - 41 U/L Final  . ALT 03/10/2020 129* 0 - 44 U/L Final  . Alkaline Phosphatase 03/10/2020 88  38 - 126 U/L Final  . Total Bilirubin 03/10/2020 0.8  0.3 - 1.2 mg/dL Final  . GFR, Estimated 03/10/2020 >60  >60 mL/min Final   Comment: (NOTE) Calculated using the CKD-EPI Creatinine Equation (2021)   . Anion gap 03/10/2020 9  5 - 15 Final   Performed at [name], [address], [place], [place] [address]  . Magnesium 03/10/2020 2.0  1.7 - 2.4 mg/dL Final   Performed at [facility], [address], [place], [place] [address]  . Phosphorus 03/10/2020 3.5  2.5 - 4.6 mg/dL Final   Performed at [facility], [address], [place], [place] [address]  . WBC  03/10/2020 5.4  4.0 - 10.5 K/uL Final  . RBC 03/10/2020 4.19* 4.22 - 5.81 MIL/uL Final  . Hemoglobin 03/10/2020 12.8* 13.0 - 17.0 g/dL Final  . HCT 12/22/2534 39.1  39.0 - 52.0 % Final  . MCV 03/10/2020 93.3  80.0 - 100.0 fL Final  . MCH 03/10/2020 30.5  26.0 - 34.0 pg Final  . MCHC 03/10/2020 32.7  30.0 - 36.0 g/dL Final  . RDW 64/40/3474 12.3  11.5 - 15.5 % Final  . Platelets 03/10/2020 240  150 - 400 K/uL Final  . nRBC 03/10/2020 0.0  0.0 - 0.2 % Final   Performed at [facility], [address],  [place], [place] [address]  Admission on 03/07/2020, Discharged on 03/07/2020  Component Date Value Ref Range Status  . WBC 03/07/2020 10.6* 4.0 - 10.5 K/uL Final  . RBC 03/07/2020 4.24  4.22 - 5.81 MIL/uL Final  . Hemoglobin 03/07/2020 13.2  13.0 - 17.0 g/dL Final  . HCT 60/45/4098 39.0  39.0 - 52.0 % Final  . MCV 03/07/2020 92.0  80.0 - 100.0 fL Final  . MCH 03/07/2020 31.1  26.0 - 34.0 pg Final  . MCHC 03/07/2020 33.8  30.0 - 36.0 g/dL Final  . RDW 11/91/4782 12.3  11.5 - 15.5 % Final  . Platelets 03/07/2020 174  150 - 400 K/uL Final  . nRBC 03/07/2020 0.0  0.0 - 0.2 % Final  . Neutrophils Relative % 03/07/2020 57  % Final  . Neutro Abs 03/07/2020 6.0  1.7 - 7.7 K/uL Final  . Lymphocytes Relative 03/07/2020 20  % Final  . Lymphs Abs 03/07/2020 2.2  0.7 - 4.0 K/uL Final  . Monocytes Relative 03/07/2020 13  % Final  . Monocytes Absolute 03/07/2020 1.4* 0.1 - 1.0 K/uL Final  . Eosinophils Relative 03/07/2020 9  % Final  . Eosinophils Absolute 03/07/2020 0.9* 0.0 - 0.5 K/uL Final  . Basophils Relative 03/07/2020 1  % Final  . Basophils Absolute 03/07/2020 0.1  0.0 - 0.1 K/uL Final  . Immature Granulocytes 03/07/2020 0  % Final  . Abs Immature Granulocytes 03/07/2020 0.04  0.00 - 0.07 K/uL Final   Performed at [name], [address] [place], [place] [address]  . Specimen Description 03/07/2020    Final                   Value:BLOOD BLOOD RIGHT  FOREARM Performed at [name], [address] [place], [place] [address]   . Special Requests 03/07/2020    Final                   Value:BOTTLES DRAWN AEROBIC AND ANAEROBIC Blood Culture results may not be optimal due to an inadequate volume of blood received in culture bottles Performed at [facility], [address] [place], [place] [address]   . Culture 03/07/2020    Final                   Value:NO GROWTH 5 DAYS Performed at [facility], [address], [place], [place] [address]   . Report Status 03/07/2020 03/12/2020 FINAL   Final  . Specimen Description 03/07/2020    Final                   Value:BLOOD LEFT ANTECUBITAL Performed at [facility], [address] [place], [place] [address]   . Special Requests 03/07/2020    Final                   Value:BOTTLES DRAWN AEROBIC AND ANAEROBIC Blood Culture results may not be optimal due to an inadequate volume of blood received in culture bottles Performed at [name], [address], [place], [place] [address]   . Culture 03/07/2020    Final                   Value:NO GROWTH 5 DAYS Performed at [facility], [address], [place], [place] [address]   . Report Status 03/07/2020 03/12/2020 FINAL   Final  . Lactic Acid, Venous 03/07/2020 1.2  0.5 - 1.9 mmol/L Final   Performed at [name], [address] [place], [place] [address]  . Sodium 03/07/2020 133* 135 - 145 mmol/L Final  . Potassium 03/07/2020 3.3* 3.5 - 5.1 mmol/L Final  . Chloride 03/07/2020 97* 98 - 111 mmol/L Final  . CO2 03/07/2020 26  22 - 32 mmol/L Final  . Glucose, Bld 03/07/2020 136* 70 - 99 mg/dL Final   Glucose reference range applies only to samples taken after fasting for at least 8 hours.  . BUN 03/07/2020 12  6 - 20 mg/dL Final  . Creatinine, Ser 03/07/2020 0.77  0.61 - 1.24 mg/dL Final  . Calcium 56/21/308601/12/2020 8.6* 8.9 - 10.3 mg/dL  Final  . Total Protein 03/07/2020 7.4  6.5 - 8.1 g/dL Final  . Albumin 57/84/696201/12/2020 3.7  3.5 - 5.0 g/dL Final  . AST 95/28/413201/12/2020 53* 15 - 41 U/L Final  . ALT 03/07/2020 141* 0 - 44 U/L Final  . Alkaline Phosphatase 03/07/2020 83  38 - 126 U/L Final  . Total Bilirubin 03/07/2020 0.7  0.3 - 1.2 mg/dL Final  . GFR, Estimated 03/07/2020 >60  >60 mL/min Final   Comment: (NOTE) Calculated using the CKD-EPI Creatinine Equation (2021)   . Anion gap 03/07/2020 10  5 - 15 Final   Performed at [name], [address] [place], [place] [address]    Allergies: Hydrocodone and Codeine  PTA Medications: (Not in a [name] admission)   Medical Decision Making  Is a 40 year old male with history of polysubstance abuse and stimulant/substance induced mood disorder who presents to the behavioral urgent care as a direct admit transfer [name] under IVC (patient IVC by EDP).  Based on patient's history, patient's presentation, prior assessments, and my assessment, believe that the patient is a potential threat to himself at this time and recommend continuous observation/assessment for the patient.    Recommendations  Based on my evaluation the patient does not appear to have an emergency medical condition.  Patient was medically cleared in the ED. [facility]/assessment for further stabilization and treatment.  Patient will be reevaluated on 06/04/20 and disposition to be determined at that time. 06/02/20 ED labs reviewed:  -PCR COVID: Negative  -CMP: AST elevated at 138 U/L and ALT elevated at 180 U/L.  Based on patient's trend of LFT values on prior CMP's, patient's LFT elevation appears to be chronic in nature  -Ethanol, salicylate, and Tylenol levels: Within normal limits  -CBC with differential: Unremarkable  -CBG: Unremarkable [facility] labs ordered:  -UDS: Positive for buprenorphine, cocaine, and marijuana We  will continue the following home medications:  -Gabapentin 300 mg 3 times daily for opioid withdrawal symptoms  -Claritin 10 mg p.o. daily for seasonal allergies (formulary alternative to patient's home medication of cetirizine 10 mg p.o. daily)  -Cymbalta 60 mg p.o. daily for depression/anxiety  -Nicorette gum 4 mg p.o. every 8 hours as needed for smoking cessation  -Trazodone 50 mg p.o. at bedtime as needed for sleep For opioid withdrawal symptoms, will initiate the following:  -COWS routine every 8 hours  -Zofran ODT 4 mg p.o. every 6 hours as needed for nausea/vomiting  -Naproxen 500 mg p.o. twice daily as needed for aching, pain, or  discomfort  -Robaxin 500 mg p.o. every 8 hours as needed for muscle spasms  -Imodium 2 to 4 mg p.o. as needed for diarrhea or loose stools  -Bentyl 20 mg p.o.  every 6 hours as needed for spasms, abdominal cramping Vistaril 25 mg p.o. 3 times daily as needed ordered for anxiety Informed the patient that I would not be ordering Suboxone/buprenorphine for him at this time.  Patient verbalized understanding and agreement of this. Due to attentional stimulating effects of Abilify, will not order patient's home medication of Abilify at this time.  Thus, no antipsychotic monitoring labs (hemoglobin A1c, lipid panel, TSH) needed at this time.  Patient's last EKG from May 28, 2020 May 28, 2020 reviewed, which shows no acute/concerning findings and QTC of 419 ms.  Jaclyn Shaggy, PA-[name] 06/04/20  6:20 AM

## 2020-06-04 NOTE — ED Notes (Signed)
Pt given breakfast.

## 2020-06-04 NOTE — ED Notes (Signed)
Pt asleep in bed. Respirations even and unlabored. Will continue to monitor for safety. ?

## 2020-06-05 ENCOUNTER — Ambulatory Visit: Payer: Self-pay | Admitting: Critical Care Medicine

## 2020-06-05 NOTE — Progress Notes (Deleted)
Subjective:    Patient ID: [name], male    DOB: [birth year], 40 y.o.   MRN: [id number]  40 y.o.M new to practice  06/06/20 Hx Hep C, neuropathy, ETOH, cocaine, smoker,   [facility]m [name]  In ED locally three times this month :  4/3, 4/8, 4/9 Adm 06/02/20 at ED: Substance induced mood disorder (HCC) Opioid use disorder, severe, dependence (HCC) Cocaine use disorder (HCC) Methamphetamine use (HCC)   Upon patient's arrival to the behavioral health urgent care, notified by [name] Counselling psychologist) of [name] (Badge # [phone]) that a warrant is being placed for the patient's arrest for assaulting a police officer when the patient was being transported from [name] emergency department to the behavioral health urgent care.  [name] states that the patient will be arrested/taken into custody in the future once he is discharged from [name].  [name] staff to communicate this information to day shift upon shift change.  HPI: Robert [name] is a 40 year old male with history of polysubstance abuse and substance-induced mood disorder who presents to the behavioral health urgent care under IVC as a direct admit transfer from [name] emergency department.  Patient presented to [name] emergency department on 06/02/20 for SI with a plan and substance abuse issues.  Patient was evaluated by TTS on 06/02/20 and initial recommendation was made for inpatient psychiatric treatment at that time.  Patient was reevaluated by psychiatry on 06/03/20 and recommendation was made for patient to be transferred to the [name] for continuous observation/assessment.  Patient was petitioned for IVC by EDP on 06/02/20 Benjiman Core, MD) and first exam for IVC was completed on 06/02/20 by Dr. Rubin Payor as well.  Affidavit and petition for IVC paperwork states the following:  "[name] w/ PMH of recent psyciatric admissions for SI, MDD, stimulant induced mood disorder presents  to ED for SI with plan of overdose on fentanyl.  last use last night".   Please see patient's IVC paperwork for further details if necessary.    Patient assessed by myself upon his arrival to [name] from the ED. Patient states that he had been staying at [name] for 2 days, but left [name] on Thursday, June 01, 2020 due to him relapsing on substances.  Patient states that on April 7 when he left [name], he paid for a hotel room and purchased fentanyl and crack cocaine.  Patient reports that he last used substances on the evening of June 01, 2020 in which he reports he used about 0.5 g of fentanyl via inhalation route and smoked 0.5 g of crack at that time.  He states that he then went to the emergency department on June 02, 2020 because he was suicidal with a plan to overdose on fentanyl and "was tired of relapsing".  Patient endorses using "at least 1 g" of fentanyl daily via injection or inhalation route for the past 4 years.  Patient also endorses using crack intermittently since the age of 40 years old, but is unable to provide further details regarding frequency/quantity of crack use.  Patient reports that he has been using methamphetamine 0.5 g daily for the past 4 years via injection route, although he reports he has not used meth for the past 2 to 3 weeks.  Patient also states that he began using heroin 4 to 5 years ago and he states that he does not use that often because it is difficult to find him  and uses fentanyl more often due to this.  Patient endorses current opiate withdrawal symptoms including diaphoresis, shakes, irritability, increased appetite, and nausea.  Patient denies history of alcohol abuse or seizures.  Patient endorses multiple periods of sobriety in the past such as 18 month, 13 month, and 72-month periods of sobriety.  He also reports intermittently engaging in NA meetings for the last 6 years.  Patient endorses passive SI on exam with no associated plan.  He  denies intentional past suicide attempts, but does admit that he has had near death experiences in the past due to accidental overdoses on substances. He denies history of self-injurious behavior via cutting or burning himself intentionally.  He denies HI, AVH, paranoia, or delusions.  Per chart review, [facility] and was discharged on Cymbalta 60 mg p.o. daily, gabapentin 300 mg p.o. 3 times daily, and Nicorette gum.  Patient states that he is supposed to be taking these medications as prescribed.  Patient also states that he is supposed to be taking Abilify and trazodone but denies any additional psychotropic medications.  Patient denies taking any additional psychotropic medications.  He states that he is not seeing a psychiatrist or therapist and has never seen a psychiatrist or therapist as an outpatient.  Patient states he is currently homeless. He also reports that his recent stressors include his girlfriend passing away about 3 months ago due to overdose.  Patient denies any access to guns or weapons.  Patient is requesting to go to a long-term substance abuse treatment facility.  He states that he is prescribed Suboxone and is requesting Suboxone treatment.  PDMP review shows history of patient being prescribed buprenorphine and buprenorphine-naloxone by multiple different providers with most recent prescription being buprenorphine-naloxone written and filled on May 17, 2020. Based on my evaluation the patient does not appear to have an emergency medical condition.  Patient was medically cleared in the ED. Patient will be placed in [facility] continuous observation/assessment for further stabilization and treatment.  Patient will be reevaluated on 06/04/20 and disposition to be determined at that time. 06/02/20 ED labs reviewed:             -PCR COVID: Negative             -CMP: AST elevated at 138 U/L and ALT elevated at 180 U/L.  Based on patient's trend of LFT values on  prior CMP's, patient's LFT elevation appears to be chronic in nature             -Ethanol, salicylate, and Tylenol levels: Within normal limits             -CBC with differential: Unremarkable             -CBG: Unremarkable [facility] labs ordered:             -UDS: Positive for buprenorphine, cocaine, and marijuana We will continue the following home medications:             -Gabapentin 300 mg 3 times daily for opioid withdrawal symptoms             -Claritin 10 mg p.o. daily for seasonal allergies (formulary alternative to patient's home medication of cetirizine 10 mg p.o. daily)             -Cymbalta 60 mg p.o. daily for depression/anxiety             -Nicorette gum 4 mg p.o. every 8 hours as needed for  smoking cessation             -Trazodone 50 mg p.o. at bedtime as needed for sleep For opioid withdrawal symptoms, will initiate the following:             -COWS routine every 8 hours             -Zofran ODT 4 mg p.o. every 6 hours as needed for nausea/vomiting             -Naproxen 500 mg p.o. twice daily as needed for aching, pain, or   discomfort             -Robaxin 500 mg p.o. every 8 hours as needed for muscle spasms             -Imodium 2 to 4 mg p.o. as needed for diarrhea or loose stools             -Bentyl 20 mg p.o. every 6 hours as needed for spasms, abdominal cramping Vistaril 25 mg p.o. 3 times daily as needed ordered for anxiety Informed the patient that I would not be ordering Suboxone/buprenorphine for him at this time.  Patient verbalized understanding and agreement of this. Due to attentional stimulating effects of Abilify, will not order patient's home medication of Abilify at this time.  Thus, no antipsychotic monitoring labs (hemoglobin A1c, lipid panel, TSH) needed at this time.  Patient's last EKG from May 28, 2020 May 28, 2020 reviewed, which shows no acute/concerning findings and QTC of 419 ms. So then [facility] 4/9 and spent night to 4/10 AM<+>>into AM 4/11  06/03/20  [facility]: Date and Time: 06/04/2020 10:49 AM  Name: [name]  MRN:  [id number]   Discharge Diagnoses:  Final diagnoses: Substance induced mood disorder (HCC) Opioid use disorder, severe, dependence (HCC) Cocaine use disorder (HCC) Methamphetamine use (HCC)   Evaluation: [name] was observed resting in bed.  He is awake, alert and oriented x3.  Patient is requesting additional outpatient resources for housing and medication management.  Chart reviewed patient [facility] with [facility] complaints.  Patient has follow-up appointment scheduled already.  [name].  patient was provided with additional outpatient resources [name].   Per admission assessment note:Robert [name] a 40 year old malewith history of polysubstance abuse and substance-induced mood disorderwho presents to the behavioral health urgent careunder IVCas a direct admit transfer [name]. [facility] on 4/8/22for SI with a plan and substance abuse issues. Patient was evaluated by TTS on 4/8/22and initial recommendation was made for inpatient psychiatric treatment at that time. Patient was reevaluated by psychiatry on 4/9/22and recommendation was made for [facility]   Demographic Factors:  Male and Low socioeconomic status  Loss Factors: Decrease in vocational status and Financial problems/change in socioeconomic status  Historical Factors: Family history of mental illness or substance abuse and Impulsivity  Risk Reduction Factors:   Positive social support and Positive therapeutic relationship  Continued Clinical Symptoms:  Depression:   Hopelessness  Cognitive Features That Contribute To Risk:  Closed-mindedness    Suicide Risk:  Minimal: No identifiable suicidal  ideation.  Patients presenting with no risk factors but with morbid ruminations; may be classified as minimal risk based on the severity of the depressive symptoms  Plan Of Care/Follow-up recommendations:  Activity:  as tolerated Diet:  heart  health  Disposition:  NP rescinded involuntary commitment on 06/05/2020 at 10:30 Patient was discharged to [name] custody  Take all medications as prescribed. Keep all follow-up appointments as scheduled.  Do not consume alcohol or use illegal drugs while on prescription medications. Report any adverse effects from your medications to your primary care provider promptly.  In the event of recurrent symptoms or worsening symptoms, call 911, a crisis hotline, or go to the nearest emergency department for evaluation.      Review of Systems     Objective:   Physical Exam        Assessment & Plan:

## 2020-06-06 ENCOUNTER — Ambulatory Visit: Payer: Self-pay | Admitting: Critical Care Medicine

## 2020-07-26 DEATH — deceased

## 2021-06-04 ENCOUNTER — Other Ambulatory Visit (HOSPITAL_COMMUNITY): Payer: Self-pay

## 2022-02-01 IMAGING — CT CT ANGIO CHEST
2 of 6 series · 17 of 46 positions shown · IV contrast (omnipaque)
Comparison: Chest x-ray September 16, 2019

CLINICAL DATA: PE suspected, high probability. History of drug and
alcohol abuse. Patient could not follow breathing instructions.

EXAM:
CT ANGIOGRAPHY CHEST WITH CONTRAST
TECHNIQUE: Multidetector CT imaging of the chest was performed using the
standard protocol during bolus administration of intravenous
contrast. Multiplanar CT image reconstructions and MIPs were
obtained to evaluate the vascular anatomy.
CONTRAST:  100mL OMNIPAQUE IOHEXOL 350 MG/ML SOLN

[Series 3: thins · axial · 0.76mm/px · z∈[-39,+205]mm · 14 of 268 slices shown]
[im 12/268  lung]
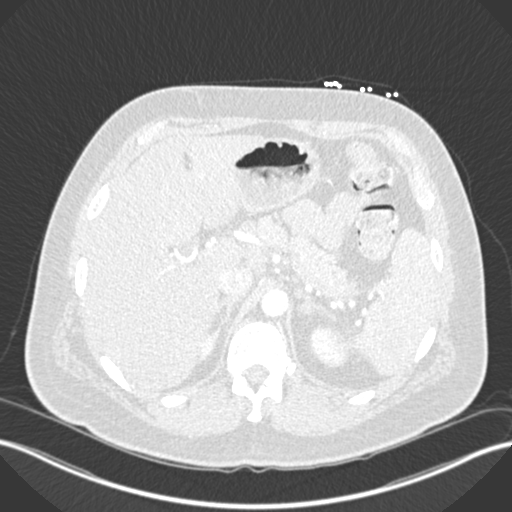
[im 35/268  soft-tissue]
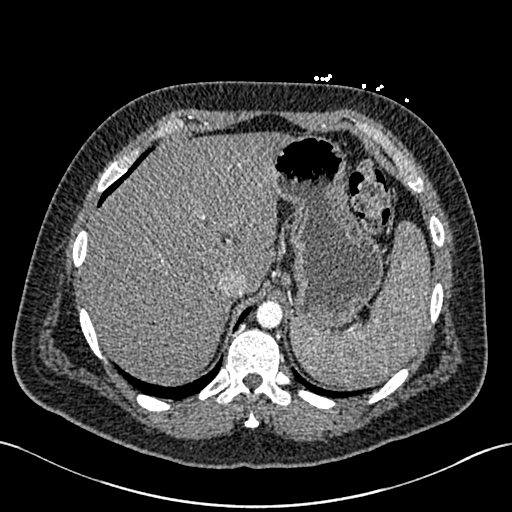
[im 47/268  lung]
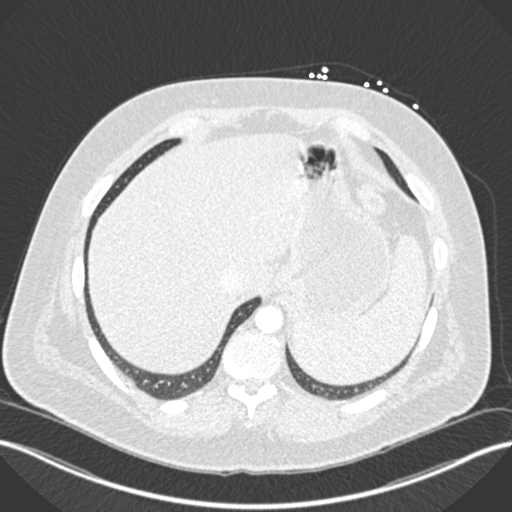
[im 70/268  soft-tissue]
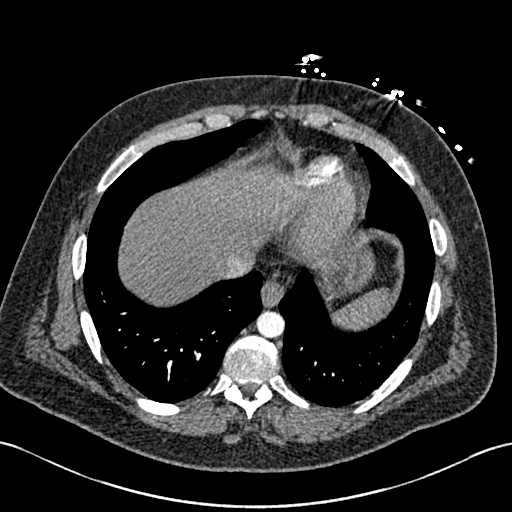
[im 93/268  lung]
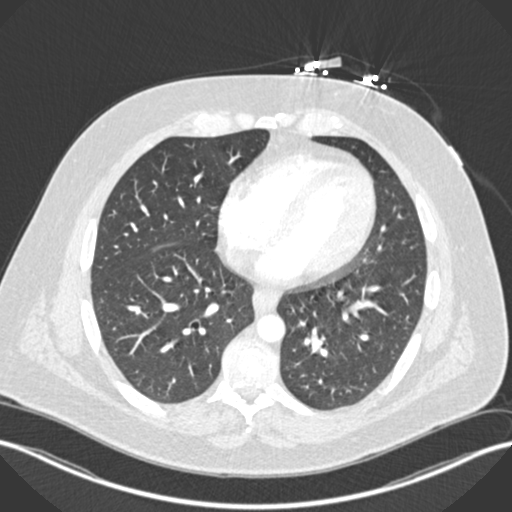
[im 105/268  soft-tissue]
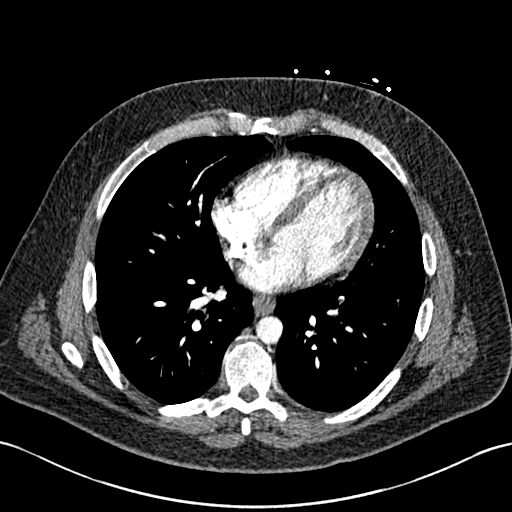
[im 128/268  lung]
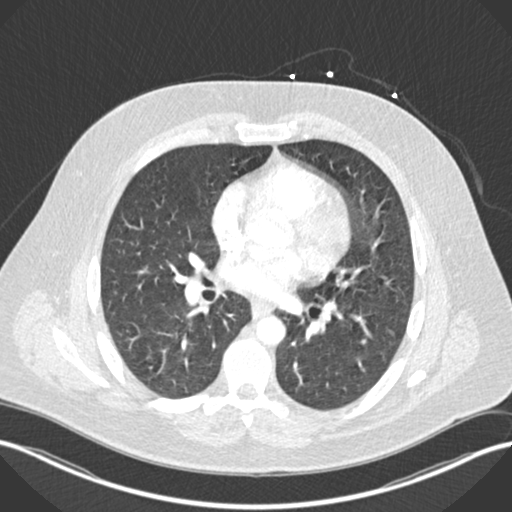
[im 140/268  soft-tissue]
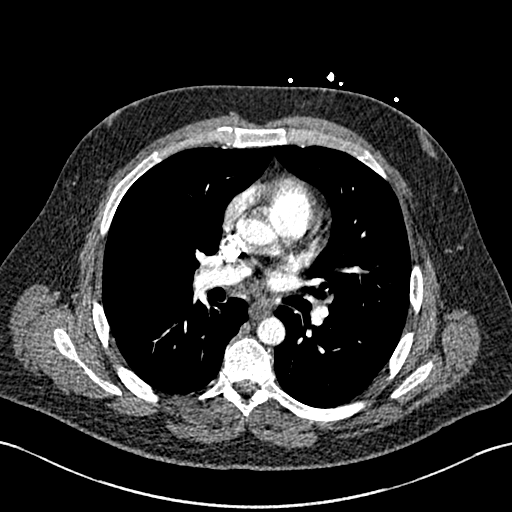
[im 163/268  lung]
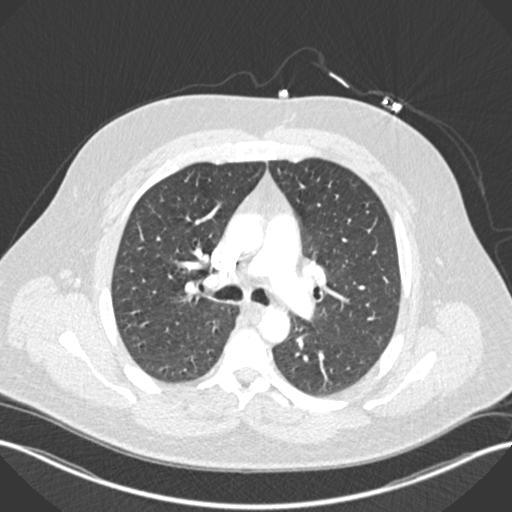
[im 175/268  soft-tissue]
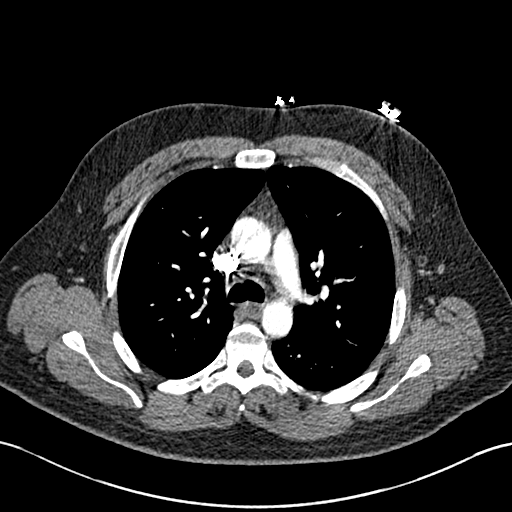
[im 198/268  lung]
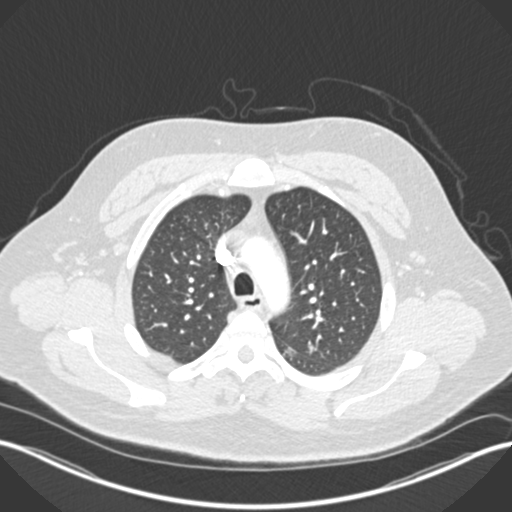
[im 221/268  soft-tissue]
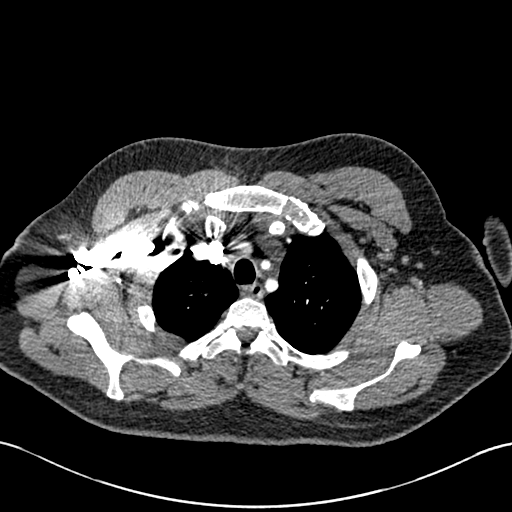
[im 233/268  lung]
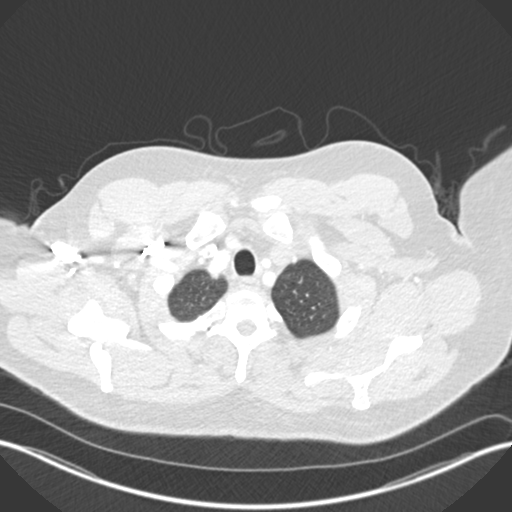
[im 256/268  soft-tissue]
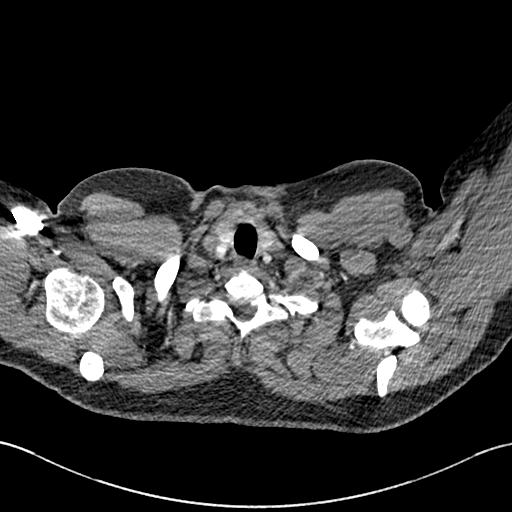

[Series 5: coronal mpr · coronal · 0.57mm/px · 3 of 163 slices shown]
[im 41/163  soft-tissue]
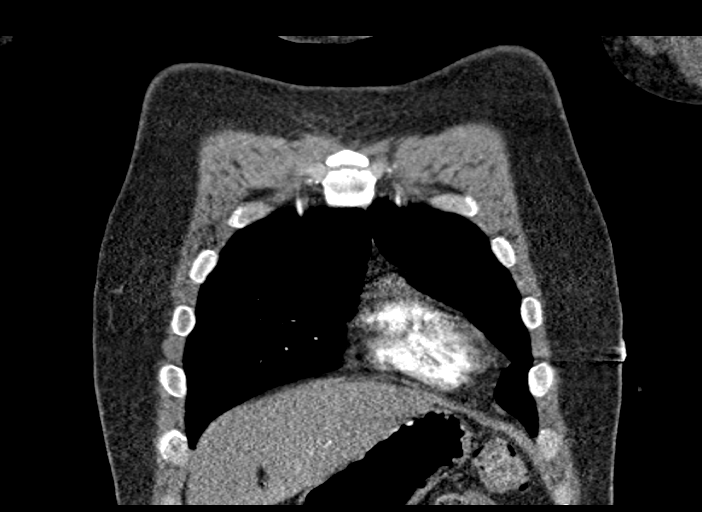
[im 82/163  soft-tissue]
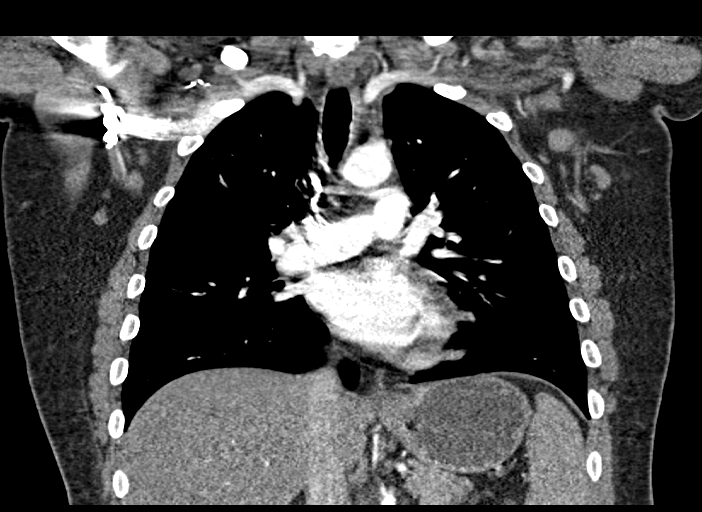
[im 122/163  soft-tissue]
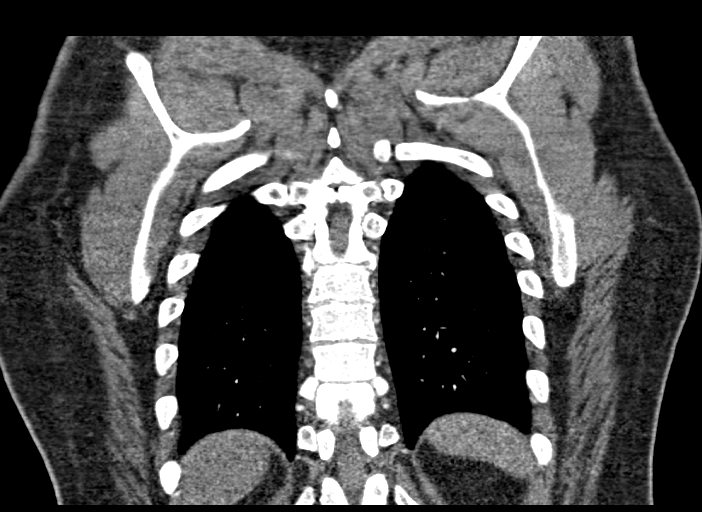

[17 of 46 positions shown; findings below may reference images not displayed]

FINDINGS: Cardiovascular: The thoracic aorta and heart are normal in
appearance. Evaluation of the pulmonary arteries is limited due to
respiratory motion and stairstep artifact. Within these limitations,
no emboli identified.

Mediastinum/Nodes: No enlarged mediastinal, hilar, or axillary lymph
nodes. Thyroid gland, trachea, and esophagus demonstrate no
significant findings.

Lungs/Pleura: Central airways are within normal limits. No
pneumothorax. There is a focal, somewhat nodular opacity in the
superior segment of the left lower lobe as seen on coronal image 112
and series 4, image 39. There is a 5 mm nodule in the left base on
series 4, image 97. A more subtle nodular density is seen in the
left base on series 4, image 95. Minimal opacity in the posterior
left upper lobe on series 4, image 36. No other suspicious findings
are seen in the lungs.

Upper Abdomen: No acute abnormality.

Musculoskeletal: No chest wall abnormality. No acute or significant
osseous findings.

Review of the MIP images confirms the above findings.
IMPRESSION: 1. Evaluation for pulmonary emboli is limited due to respiratory
motion and stairstep artifact. Within these limitations, no emboli
identified.
2. There is a somewhat nodular opacity in the superior segment the
left lower lobe as seen on coronal image 112 and series 4, image 39.
I suspect this is a focal infiltrate rather than a developing
neoplasm although the findings are nonspecific on a single study.
The opacity measures up to 1.9 cm on coronal image 112. There are
several other scattered nodules on the left as above which could be
part of a broader infectious process but are nonspecific. Recommend
treating the patient's symptoms with a follow-up CT scan in 2 or 3
months to ensure resolution.
3. No other acute abnormalities.

## 2022-03-02 IMAGING — CR DG CHEST 2V
2 series · 2 of 2 positions shown · non-contrast
Comparison: None.

CLINICAL DATA: Pt reports using cocaine over 24 hours ago. Pt
states that he has been unable to sleep and has racing thoughts. Pt
also endorses severe chest pain earlier today.chest pain

EXAM:
CHEST - 2 VIEW

[w chest pa]
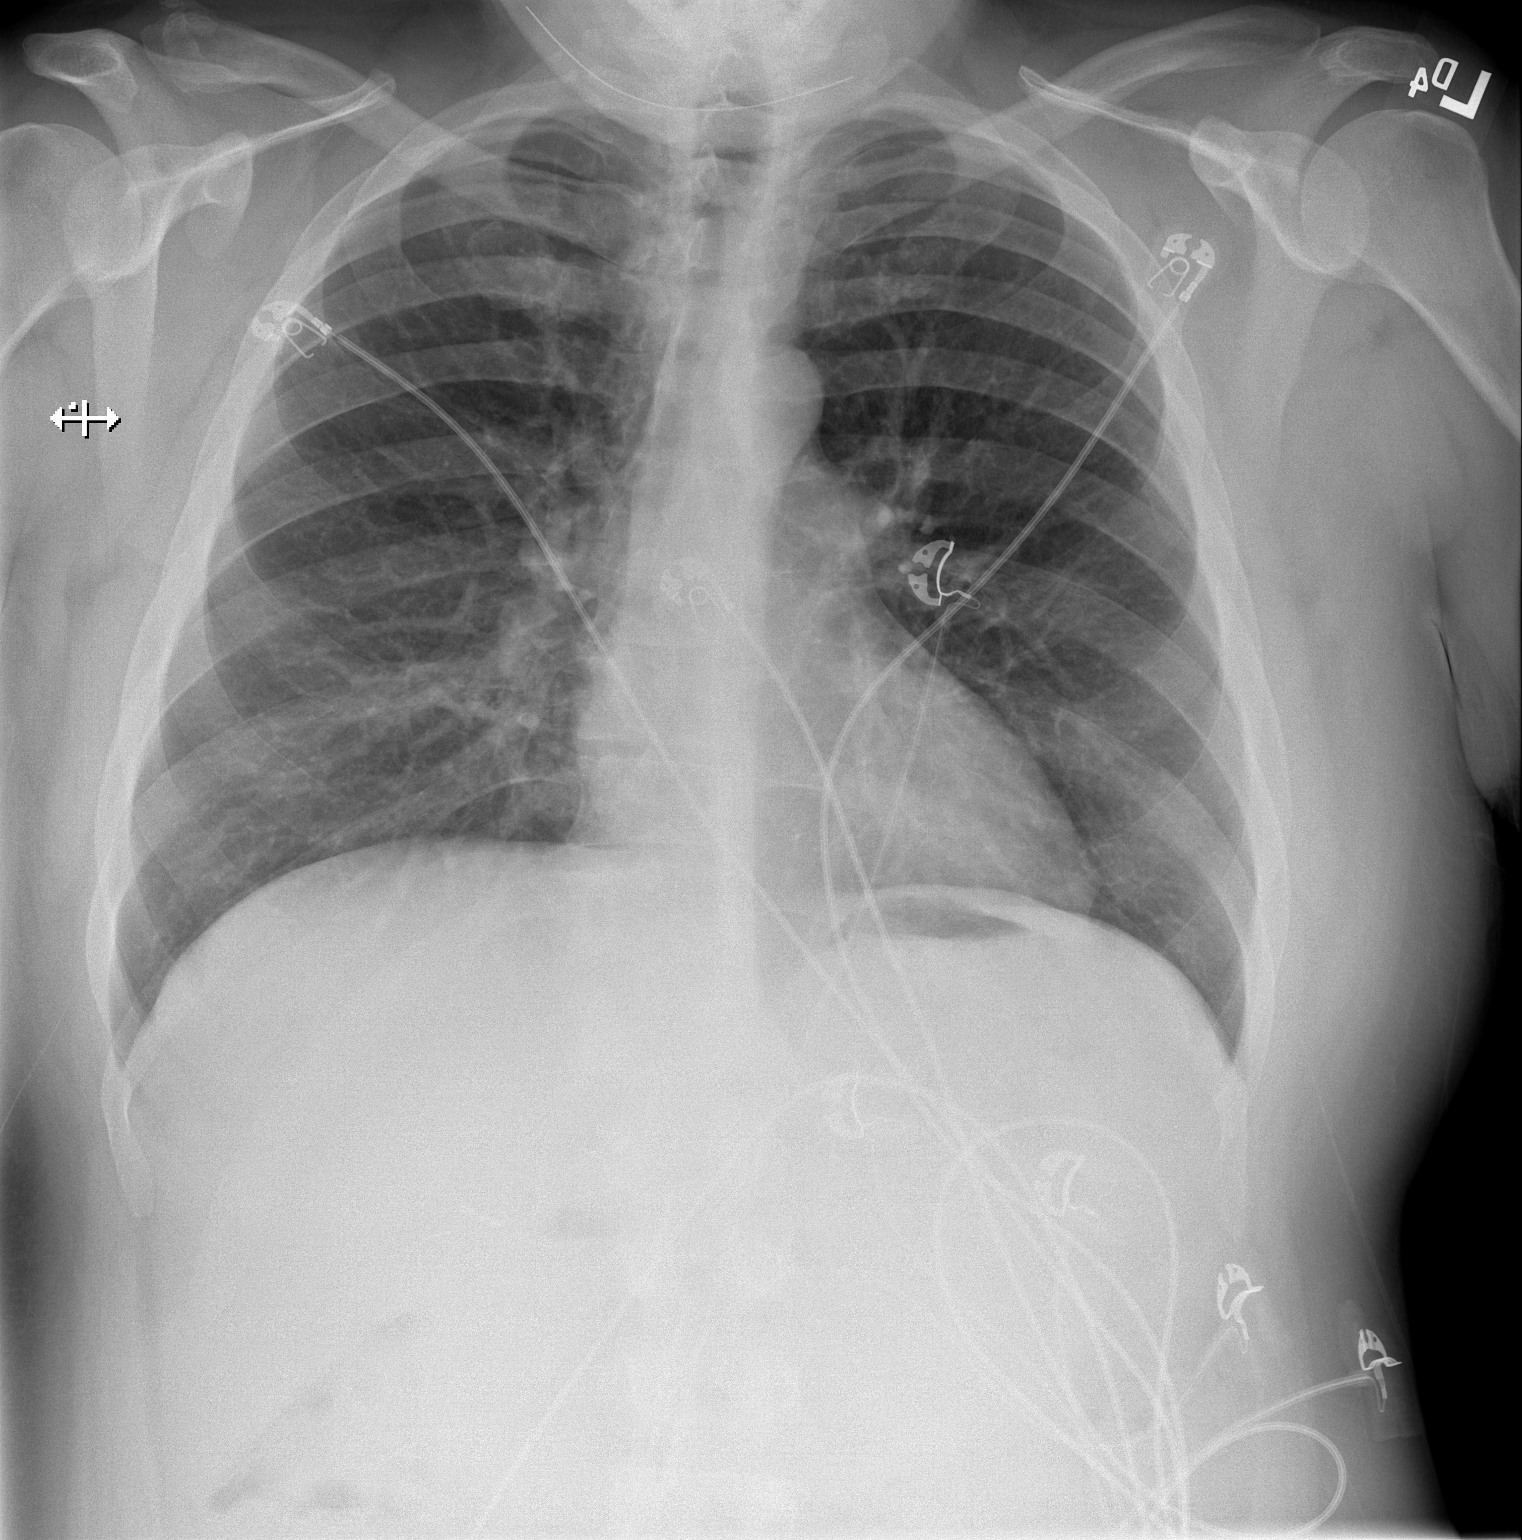

[w chest lat]
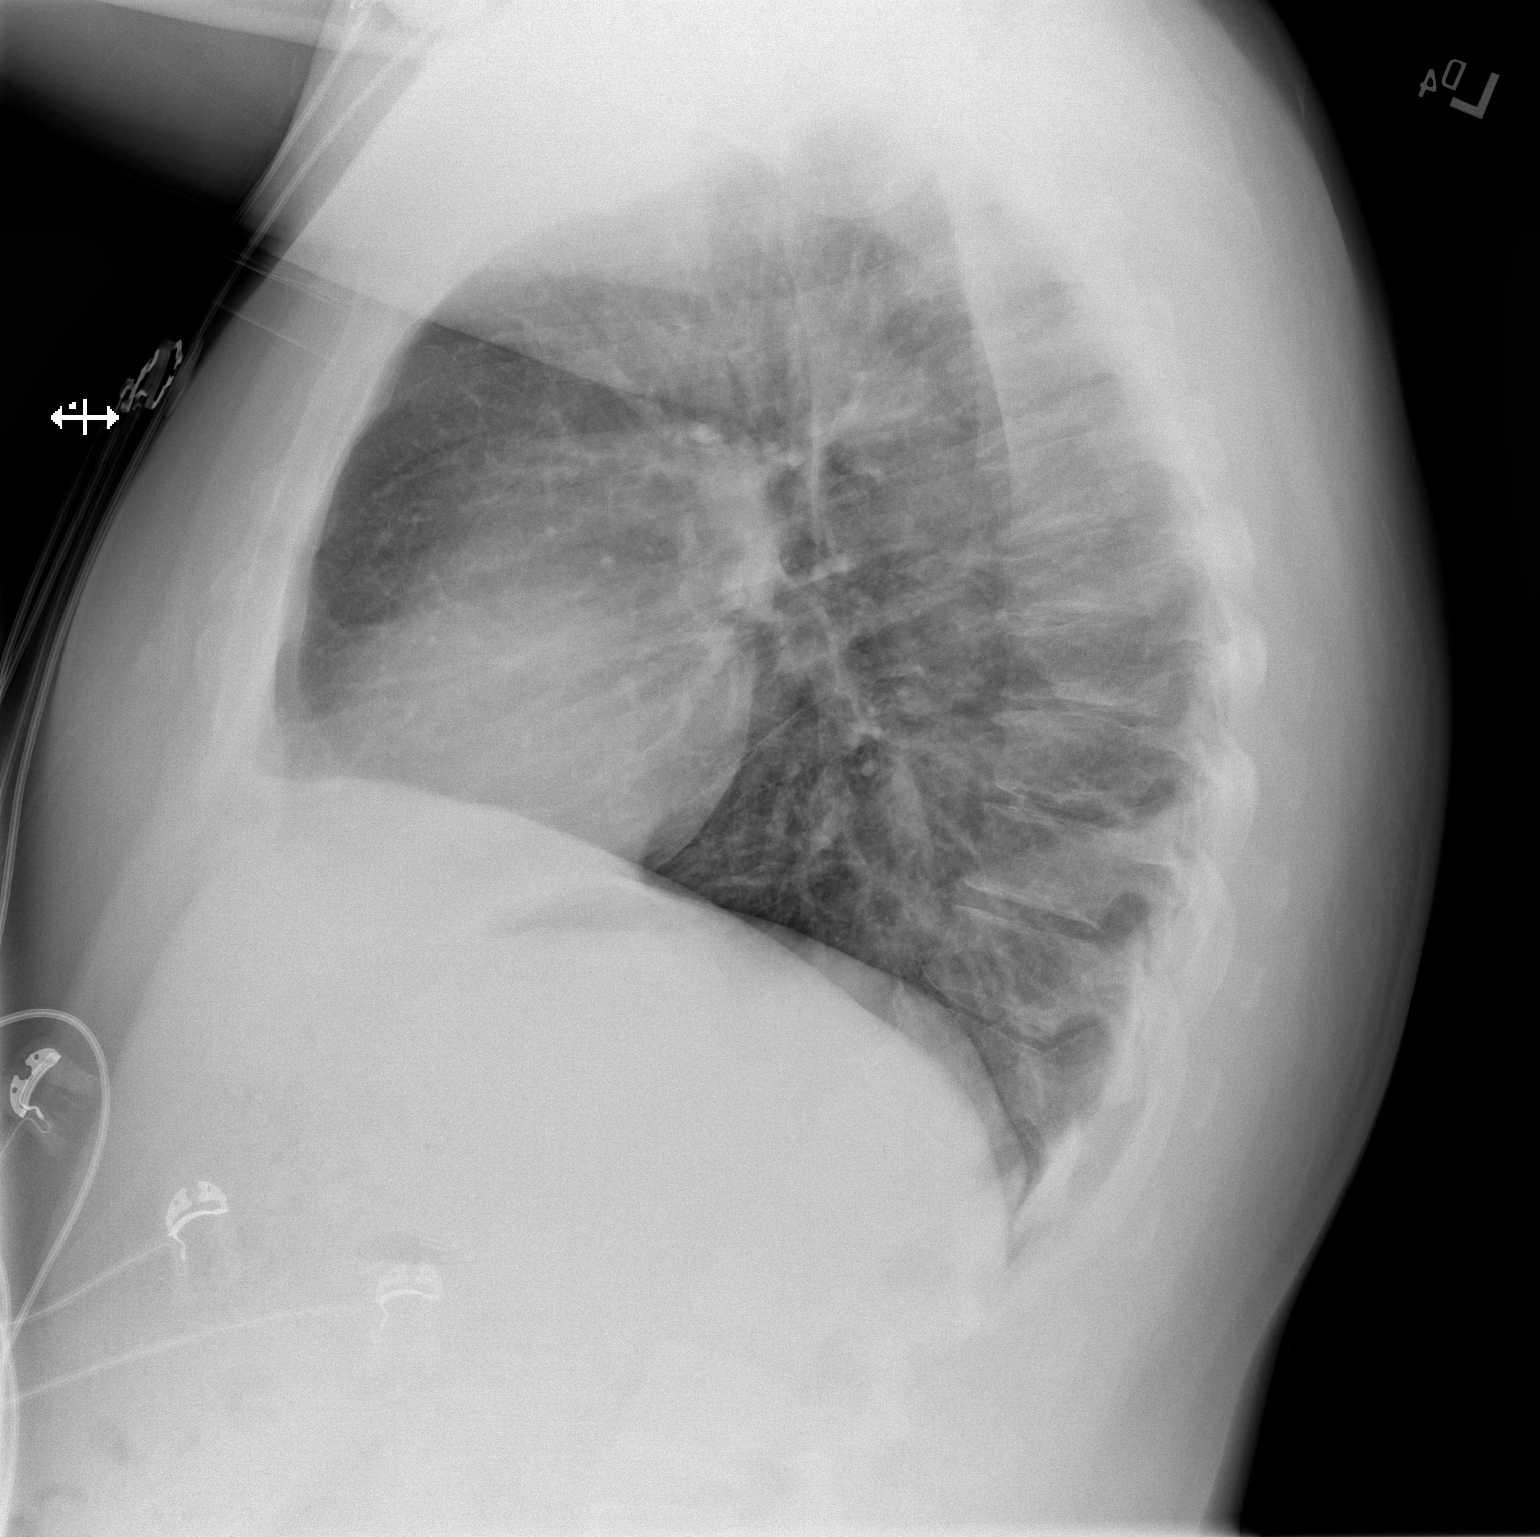

[2 of 2 positions shown; findings below may reference images not displayed]

FINDINGS: Normal mediastinum and cardiac silhouette. Normal pulmonary
vasculature. No evidence of effusion, infiltrate, or pneumothorax.
No acute bony abnormality.
IMPRESSION: No acute cardiopulmonary process.
